# Patient Record
Sex: Female | Born: 1965
Health system: Southern US, Community
[De-identification: ages and names within clinical notes are randomized; demographics above are authoritative.]

## PROBLEM LIST (undated history)

## (undated) DIAGNOSIS — I10 Essential (primary) hypertension: Secondary | ICD-10-CM

## (undated) DIAGNOSIS — J189 Pneumonia, unspecified organism: Secondary | ICD-10-CM

## (undated) DIAGNOSIS — F419 Anxiety disorder, unspecified: Secondary | ICD-10-CM

## (undated) DIAGNOSIS — F32A Depression, unspecified: Secondary | ICD-10-CM

## (undated) DIAGNOSIS — F17200 Nicotine dependence, unspecified, uncomplicated: Secondary | ICD-10-CM

## (undated) DIAGNOSIS — M5020 Other cervical disc displacement, unspecified cervical region: Secondary | ICD-10-CM

## (undated) DIAGNOSIS — F329 Major depressive disorder, single episode, unspecified: Secondary | ICD-10-CM

## (undated) DIAGNOSIS — Z87442 Personal history of urinary calculi: Secondary | ICD-10-CM

## (undated) DIAGNOSIS — R911 Solitary pulmonary nodule: Secondary | ICD-10-CM

## (undated) HISTORY — DX: Essential (primary) hypertension: I10

## (undated) HISTORY — DX: Major depressive disorder, single episode, unspecified: F32.9

## (undated) HISTORY — DX: Pneumonia, unspecified organism: J18.9

## (undated) HISTORY — DX: Depression, unspecified: F32.A

## (undated) HISTORY — DX: Other cervical disc displacement, unspecified cervical region: M50.20

## (undated) HISTORY — DX: Nicotine dependence, unspecified, uncomplicated: F17.200

## (undated) HISTORY — PX: TONSILLECTOMY: SUR1361

## (undated) HISTORY — DX: Personal history of urinary calculi: Z87.442

## (undated) HISTORY — DX: Anxiety disorder, unspecified: F41.9

## (undated) HISTORY — DX: Solitary pulmonary nodule: R91.1

## (undated) HISTORY — PX: ABDOMINAL HERNIA REPAIR: SHX539

## (undated) HISTORY — PX: CYSTECTOMY: SUR359

---

## 1998-09-18 HISTORY — PX: CERVICAL FUSION: SHX112

## 2006-05-22 ENCOUNTER — Encounter: Admission: RE | Admit: 2006-05-22 | Discharge: 2006-08-01 | Payer: Self-pay | Admitting: Neurosurgery

## 2007-02-01 ENCOUNTER — Encounter (INDEPENDENT_AMBULATORY_CARE_PROVIDER_SITE_OTHER): Payer: Self-pay | Admitting: *Deleted

## 2007-02-01 ENCOUNTER — Ambulatory Visit (HOSPITAL_BASED_OUTPATIENT_CLINIC_OR_DEPARTMENT_OTHER): Admission: RE | Admit: 2007-02-01 | Discharge: 2007-02-01 | Payer: Self-pay | Admitting: *Deleted

## 2008-09-18 LAB — HM MAMMOGRAPHY: HM Mammogram: NORMAL

## 2010-08-18 DIAGNOSIS — J189 Pneumonia, unspecified organism: Secondary | ICD-10-CM

## 2010-08-18 DIAGNOSIS — R911 Solitary pulmonary nodule: Secondary | ICD-10-CM

## 2010-08-18 HISTORY — DX: Solitary pulmonary nodule: R91.1

## 2010-08-18 HISTORY — DX: Pneumonia, unspecified organism: J18.9

## 2010-08-29 ENCOUNTER — Emergency Department (HOSPITAL_BASED_OUTPATIENT_CLINIC_OR_DEPARTMENT_OTHER)
Admission: EM | Admit: 2010-08-29 | Discharge: 2010-08-29 | Payer: Self-pay | Source: Home / Self Care | Admitting: Emergency Medicine

## 2010-09-05 ENCOUNTER — Ambulatory Visit (HOSPITAL_BASED_OUTPATIENT_CLINIC_OR_DEPARTMENT_OTHER)
Admission: RE | Admit: 2010-09-05 | Discharge: 2010-09-05 | Payer: Self-pay | Source: Home / Self Care | Attending: Internal Medicine | Admitting: Internal Medicine

## 2010-09-05 ENCOUNTER — Ambulatory Visit: Payer: Self-pay | Admitting: Family

## 2010-09-05 ENCOUNTER — Encounter: Payer: Self-pay | Admitting: Family

## 2010-09-05 DIAGNOSIS — M542 Cervicalgia: Secondary | ICD-10-CM | POA: Insufficient documentation

## 2010-09-05 DIAGNOSIS — R911 Solitary pulmonary nodule: Secondary | ICD-10-CM | POA: Insufficient documentation

## 2010-09-05 DIAGNOSIS — I10 Essential (primary) hypertension: Secondary | ICD-10-CM | POA: Insufficient documentation

## 2010-09-05 DIAGNOSIS — F329 Major depressive disorder, single episode, unspecified: Secondary | ICD-10-CM | POA: Insufficient documentation

## 2010-09-05 DIAGNOSIS — J189 Pneumonia, unspecified organism: Secondary | ICD-10-CM | POA: Insufficient documentation

## 2010-09-05 DIAGNOSIS — F418 Other specified anxiety disorders: Secondary | ICD-10-CM | POA: Insufficient documentation

## 2010-09-05 LAB — CONVERTED CEMR LAB: Beta hcg, urine, semiquantitative: NEGATIVE

## 2010-09-20 ENCOUNTER — Encounter: Payer: Self-pay | Admitting: Family

## 2010-09-20 ENCOUNTER — Ambulatory Visit
Admission: RE | Admit: 2010-09-20 | Discharge: 2010-09-20 | Payer: Self-pay | Source: Home / Self Care | Attending: Family | Admitting: Family

## 2010-09-20 DIAGNOSIS — F411 Generalized anxiety disorder: Secondary | ICD-10-CM | POA: Insufficient documentation

## 2010-09-21 ENCOUNTER — Encounter: Payer: Self-pay | Admitting: Family

## 2010-10-04 ENCOUNTER — Encounter: Payer: Self-pay | Admitting: Family

## 2010-10-04 ENCOUNTER — Ambulatory Visit
Admission: RE | Admit: 2010-10-04 | Discharge: 2010-10-04 | Payer: Self-pay | Source: Home / Self Care | Attending: Family | Admitting: Family

## 2010-10-04 DIAGNOSIS — R059 Cough, unspecified: Secondary | ICD-10-CM | POA: Insufficient documentation

## 2010-10-04 DIAGNOSIS — R05 Cough: Secondary | ICD-10-CM | POA: Insufficient documentation

## 2010-10-06 ENCOUNTER — Encounter (INDEPENDENT_AMBULATORY_CARE_PROVIDER_SITE_OTHER): Payer: Self-pay | Admitting: *Deleted

## 2010-10-13 ENCOUNTER — Telehealth: Payer: Self-pay | Admitting: Critical Care Medicine

## 2010-10-13 ENCOUNTER — Ambulatory Visit
Admission: RE | Admit: 2010-10-13 | Discharge: 2010-10-13 | Payer: Self-pay | Source: Home / Self Care | Attending: Critical Care Medicine | Admitting: Critical Care Medicine

## 2010-10-13 ENCOUNTER — Ambulatory Visit (HOSPITAL_BASED_OUTPATIENT_CLINIC_OR_DEPARTMENT_OTHER)
Admission: RE | Admit: 2010-10-13 | Discharge: 2010-10-13 | Payer: Self-pay | Source: Home / Self Care | Attending: Critical Care Medicine | Admitting: Critical Care Medicine

## 2010-10-20 NOTE — Assessment & Plan Note (Signed)
   Allergies: 1)  ! Wellbutrin 2)  ! Darvocet   Complete Medication List: 1)  Naproxen 500 Mg Tabs (Naproxen) .... Take 1 tablet as needed for inflammation. 2)  Depo-provera 150 Mg/ml Susp (Medroxyprogesterone acetate) .Marland Kitchen.. 1 injection every 3 months. 3)  Nicotrol 10 Mg Inha (Nicotine) .... Use up to 6 cartridges a day inhaled in place of cigarettes.  use frequent puffing for 20 minutes each cartridge. 4)  Alprazolam 0.25 Mg Tabs (Alprazolam) .... 1/2 to 1 tablet by mouth up to 3 times daily as needed 5)  Citalopram Hydrobromide 20 Mg Tabs (Citalopram hydrobromide) .... Take 1 tablet once daily. 6)  Advair Diskus 100-50 Mcg/dose Aepb (Fluticasone-salmeterol) .... One puff twice daily  Patient Instructions: 1)  Call if you develop fever, if symptoms worsen, or if you are not feeling better in 48 to 72 hours. 2)  Follow up in February as scheduled.

## 2010-10-20 NOTE — Miscellaneous (Signed)
Summary: Controlled Substance Agreement  Controlled Substance Agreement   Imported By: Lanelle Bal 09/28/2010 14:26:23  _____________________________________________________________________  External Attachment:    Type:   Image     Comment:   External Document

## 2010-10-20 NOTE — Progress Notes (Signed)
Summary: CXR normal   Phone Note Outgoing Call   Reason for Call: Discuss lab or test results Summary of Call: call pt and tell her cxr completely normal, no pneumonia, take meds as RX from todays OV Initial call taken by: Storm Frisk MD,  October 13, 2010 4:07 PM  Follow-up for Phone Call        [facility] Crystal Jones RN  October 14, 2010 8:45 AM  Spoke with pt.  She was informed of above CXR results and recs per PW.  She verbalized understanding.   Follow-up by: Gweneth Dimitri RN,  October 14, 2010 9:26 AM

## 2010-10-20 NOTE — Assessment & Plan Note (Signed)
Summary: ANXIETY/MHF--rm 4   Vital Signs:  Patient profile:   45 year old female Menstrual status:  Currently on Depo injections Height:      64.5 inches Weight:      123 pounds BMI:     20.86 Temp:     97.8 degrees F oral Pulse rate:   84 / minute Pulse rhythm:   regular Resp:     16 per minute BP sitting:   120 / 90  (left arm) Cuff size:   regular  Vitals Entered By: Mervin Kung CMA Duncan Dull) (September 20, 2010 11:14 AM) CC: Pt here for follow up. States she has not had any improvement with her anxiety. Therapist recommended she try Lexapro. Is Patient Diabetic? No Pain Assessment Patient in pain? no      Comments Pt would like to try rx. Feels like her insides are shaking. Nicki Guadalajara Fergerson CMA Duncan Dull)  September 20, 2010 11:23 AM    Primary Care Provider:  Ms. Andria Meuse is a 45 year old female who presents today to establish care  CC:  Pt here for follow up. States she has not had any improvement with her anxiety. Therapist recommended she try Lexapro.Marland Kitchen  History of Present Illness: Anxiety- heart racing, can't breath, legs turn to jello.  [facility], therapist.  Has been seeing her since her divorce.  OB gave her xanax for sleep, has taken 1/2 xanax with some improvement.  Whole tab makes her sleepy.  Has tried multiple antidepressants in the past which she has not tolerated due to makes her feel light headed.  Has tried effexor in the past.    Depression- is unchanged.   Allergies: 1)  ! Wellbutrin 2)  ! Darvocet  Past History:  Past Medical History: Last updated: 09/05/2010 Hx of kidney stones Rupture C4,5; cervical fusion depression Pneumonia 12/11 Pulmonary nodule per CT 12/11  Past Surgical History: Last updated: 09/05/2010 Cervical fusion--2000 C-section x 2 Abdominal hernia repair as child  Review of Systems       see HPI  Physical Exam  General:  Well-developed,well-nourished,in no acute distress; alert,appropriate and cooperative  throughout examination Psych:  Pleasant, anxious appearing female.  Tearful at times during the interview.  Normally interactive and good eye contact.     Impression & Recommendations:  Problem # 1:  ANXIETY (ICD-300.00) Assessment Deteriorated She has tried multiple meds in past (see HPI of last office visit).  Has not tried citalopram.  A refill was provided for the alprazolam on an as needed basis.  Pt signed controlled substance agreement today.  She was also counseled on side effects and red flags that should prompt call to [place] as noted in sign out sheet.  25 minutes were spent with the patient today.  Greater than 50% of this time was spent counseling patient on depression and anxiety.   Her updated medication list for this problem includes:     Alprazolam 0.25 Mg Tabs (Alprazolam) .Marland Kitchen... 1/2 to 1 tablet by mouth up to 3 times daily as needed    Citalopram Hydrobromide 20 Mg Tabs (Citalopram hydrobromide) .Marland Kitchen... 1/2 tablet by mouth daily for 1 week, then increase to full tab once daily on second week as tolerated.  Orders: T-TSH [phone])  Problem # 2:  ELEVATED BLOOD PRESSURE WITHOUT DIAGNOSIS OF HYPERTENSION (ICD-796.2) Follow up BP check later in the visit was 160 over 100.  I suspect that anxiety is playing a role in her pressure.  I recommended that she f/u in  2 weeks for a f/u nurse visit.    Complete Medication List: 1)  Naproxen 500 Mg Tabs (Naproxen) .... Take 1 tablet as needed for inflammation. 2)  Depo-provera 150 Mg/ml Susp (Medroxyprogesterone acetate) .Marland Kitchen.. 1 injection every 3 months. 3)  Nicotrol 10 Mg Inha (Nicotine) .... Use up to 6 cartridges a day inhaled in place of cigarettes.  use frequent puffing for 20 minutes each cartridge. 4)  Alprazolam 0.25 Mg Tabs (Alprazolam) .... 1/2 to 1 tablet by mouth up to 3 times daily as needed 5)  Citalopram Hydrobromide 20 Mg Tabs (Citalopram hydrobromide) .... 1/2 tablet by mouth daily for 1 week, then increase to full tab once  daily on second week as tolerated.  Patient Instructions: 1)  Citalopram- Please start 1/2 tablet one a day for one week, then increase to a full tablet. 2)  It will likely take several weeks before you will notice improvement. 3)  Side effects of this medicine may include drowsiness or nausea.  If this becomes an issue for you call for further instructions. 4)  Very rarely people may develop suicidal thoughts when taking these types of medicines- should this happen to you, discontinue medication and go directly to the emergency room. 5)  Please arrange a follow up appointment in 1 month  Prescriptions: CITALOPRAM HYDROBROMIDE 20 MG TABS (CITALOPRAM HYDROBROMIDE) 1/2 tablet by mouth daily for 1 week, then increase to Full tab once daily on second week as tolerated.  #30 x 1   Entered and Authorized by:   Lemont Fillers FNP   Signed by:   Lemont Fillers FNP on 09/20/2010   Method used:   Electronically to        [facility] [phone] (retail)       [address]       [place], [place]  [address]       Ph: [phone]       Fax: [phone]   RxID:   [id number] ALPRAZOLAM 0.25 MG TABS (ALPRAZOLAM) 1/2 to 1 tablet by mouth up to 3 times daily as needed  #45 x 0   Entered and Authorized by:   Lemont Fillers FNP   Signed by:   Lemont Fillers FNP on 09/20/2010   Method used:   Print then Give to Patient   RxID:   [id number]    Orders Added: 1)  T-TSH [[id number]] 2)  Est. Patient Level IV [87564]    Current Allergies (reviewed today): ! [facility] ! DARVOCET

## 2010-10-20 NOTE — Assessment & Plan Note (Signed)
Summary: new to est bcbs/mhf--Rm 4   Vital Signs:  Patient profile:   45 year old female Menstrual status:  Currently on Depo injections LMP:     08/18/2008 Height:      64.5 inches Weight:      120.25 pounds BMI:     20.40 Temp:     97.8 degrees F oral Pulse rate:   78 / minute Pulse rhythm:   regular Resp:     16 per minute BP sitting:   140 / 92  (right arm) Cuff size:   regular  Vitals Entered By: Mervin Kung CMA Duncan Dull) (September 05, 2010 11:26 AM) Is Patient Diabetic? No Pain Assessment Patient in pain? yes     Location: neck Onset of pain  hx of cervical fusion LMP (date): 08/18/2008     Menstrual Status Currently on Depo injections Enter LMP: 08/18/2008 Last PAP Result normal, had biopsy that was normal   Primary Care Devinn Hurwitz:  Alexis English is a 45 year old female who presents today to establish care   History of Present Illness: Alexis English  is a 45 year old female who presents today to establish care.   1)Pneumonia- [facility] and was diagnosed with pneumonia.  She was treated with Avelox and has completed antibiotics.  She has continued to work during her treatment and has kept a busy schedule.   No fever had dizzy feeling/black outs, pale yestday.  Lasted about 30 minutes.    2) Neck pain- hx of cervical fusion.  Done in [facility], (dr. Rodney Booze) Uses naproxen as needed.  3) Remote hx of kidney stones 25 yrs ago.    4)Depression- has tried zoloft, paxil, pristique, wellbutrin- caused hives.  Other meds caused lightheadedness/dizziness.  Has been ok recently- also sees a therapist.    Dr. Christella Hartigan- [facility]- treating her with depo.  Preventive Screening-Counseling & Management  Alcohol-Tobacco     Alcohol drinks/day: <1     Smoking Status: current  Caffeine-Diet-Exercise     Does Patient Exercise: no  Problems Prior to Update: 1)  Pneumonia, Right Middle Lobe  (ICD-486) 2)  Pulmonary Nodule   (ICD-518.89)  Allergies (verified): 1)  ! Wellbutrin 2)  ! 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 Resp: + cough, clear, improved but not resolved.  CV:  Mild left sided "twinge" was on the right side last week GI:  mild nausea yesterday, mild diarrhea for several months GU: Denies dysuria Lymphatic: Denies lymphadenopathy Musculoskeletal:  Denies muscle/joint pain except (chronic neck pain) Skin:  Denies Rashes Psychiatric: + history of depression- sees therapist- depression is "situational"   Neuro: Denies numbness     Physical Exam  General:  Well-developed,well-nourished,in no acute distress; alert,appropriate and cooperative throughout examination Head:  Normocephalic and atraumatic without obvious abnormalities.  No apparent alopecia or balding. Eyes:  PERRLA, sclera clear Ears:  External ear exam shows no significant lesions or deformities.  Otoscopic examination reveals clear canals, tympanic membranes are intact bilaterally without bulging, retraction, inflammation or discharge. Hearing is grossly normal bilaterally. Mouth:  Oral mucosa and oropharynx without lesions or exudates.  Teeth in good repair. Neck:  No deformities, masses, or  tenderness noted. Lungs:  Normal respiratory effort, chest expands symmetrically. Lungs are clear to auscultation, no crackles or wheezes. Heart:  Normal rate and regular rhythm. S1 and S2 normal without gallop, murmur, click, rub or other extra sounds. Abdomen:  Bowel sounds positive,abdomen soft and non-tender without masses, organomegaly or hernias noted. Psych:  Cognition and judgment appear intact. Alert and cooperative with normal attention span and concentration. No apparent delusions, illusions, hallucinations   Impression & Recommendations:  Problem # 1:  PNEUMONIA, RIGHT MIDDLE LOBE (ICD-486) Assessment Improved ED records were reviewed.  Patient completed antibiotics.  F/u chest  x-ray notes resolution of her RML pneumonia.  She still doesn't have her full strength back.  I have encouraged her to rest and try to slow down while her body heals.  I have also encouraged her to quit smoking and a sample of Nicotrol inhalation cartridges was provided to the patient: [id number] nicotrol  Orders: CXR- 2view (CXR)  Problem # 2:  PULMONARY NODULE (ZOX-096.04) Assessment: New  This was noted on the CT which was performed in the ED.  Incidental finding.  Will plan a follow up study in 6 months to ensure stability.   Problem # 3:  NECK PAIN, CHRONIC (ICD-723.1) Assessment: Unchanged Continue as needed naproxen Her updated medication list for this problem includes:    Naproxen 500 Mg Tabs (Naproxen) .Marland Kitchen... Take 1 tablet as needed for inflammation.  Problem # 4:  DEPRESSION (ICD-311) Assessment: Unchanged Pt has been intolerant to antidepressants.  She feels that this problem is stable at this time.  Will monitor.   Problem # 5:  ELEVATED BLOOD PRESSURE WITHOUT DIAGNOSIS OF HYPERTENSION (ICD-796.2) Assessment: New Pt. was counseled on low sodium diet.  Plan to repeat in 1 month.   Complete Medication List: 1)  Naproxen 500 Mg Tabs (Naproxen) .... Take 1 tablet as needed for  inflammation. 2)  Depo-provera 150 Mg/ml Susp (Medroxyprogesterone acetate) .Marland Kitchen.. 1 injection every 3 months. 3)  Nicotrol 10 Mg Inha (Nicotine) .... Use up to 6 cartridges a day inhaled in place of cigarettes.  use frequent puffing for 20 minutes each cartridge.  Other Orders: Urine Pregnancy Test  (54098) EKG w/ Interpretation (93000)  Patient Instructions: 1)  Please complete your chest x-ray downstairs today. 2)  Follow up in 1 month for a blood pressure check. 3)  Work hard on quitting smoking.     Orders Added: 1)  CXR- 2view [CXR] 2)  Urine Pregnancy Test  [81025] 3)  EKG w/ Interpretation [93000] 4)  New Patient Level III [99203]    Prevention & Chronic Care Immunizations   Influenza vaccine: Not documented    Tetanus booster: Not documented    Pneumococcal vaccine: Not documented  Other Screening   Pap smear: normal, had biopsy that was normal  (02/16/2010)    Mammogram: normal  (09/18/2008)   Smoking status: current  (09/05/2010)  Lipids   Total Cholesterol: Not documented   LDL: Not documented   LDL Direct: Not documented   HDL: Not documented   Triglycerides: Not documented   Current Allergies (reviewed today): ! [facility] ! DARVOCET  Vital Signs:  Patient Profile:   45 year old female LMP:     08/18/2008 Height:     64.5 inches Weight:      120.25 pounds BMI:     20.40 Temp:     97.8 degrees F oral Pulse rate:   78 / minute Pulse rhythm:   regular Resp:     16 per minute BP sitting:   140 / 92 Cuff size:   regular    Location:   neck                   Preventive Care Screening  Pap Smear:    Date:  02/16/2010    Results:  normal, had biopsy that was normal   Mammogram:    Date:  09/18/2008    Results:  normal      Can't remember last tetanus.  Hasn't had flu shot this year. Nicki Guadalajara Fergerson CMA Duncan Dull)  September 05, 2010 11:44 AM    Laboratory Results   Urine Tests   Date/Time Reported: Mervin Kung CMA  Duncan Dull)  September 05, 2010 12:31 PM     Urine HCG: negative

## 2010-10-20 NOTE — Letter (Signed)
   [place] [facility] [address] [place], [place]  [address]  [place] Phone: [phone]      September 21, 2010   Alexis English [address] [place], [place] [address]  RE:  LAB RESULTS  Dear  Alexis English,  The following is an interpretation of your most recent lab tests.  Please take note of any instructions provided or changes to medications that have resulted from your lab work.  THYROID STUDIES:  Thyroid studies normal TSH: 1.662      Sincerely Yours,    Lemont Fillers FNP  Appended Document:  Mailed.

## 2010-10-20 NOTE — Letter (Signed)
Summary: Controlled Substances Contract  [place] [facility]  [address]   [place], [place] [address]   Phone: [phone]  Fax: [phone]    [facility] Controlled Substances Contract         Patient Name: Alexis English Patient DOB: 22-Jul-1966        Patient MRN:  [id number]        Physician's Name: _________________________________________   Patients must complete this contract before doctors at the [facility] office will be willing to prescribe controlled substances. I understand that: ___1)  I am responsible for my controlled substance medications.  If my prescription is lost, misplaced or stolen, or if I take more than prescribed, my doctor will not write me a new prescription. ___2)  I will not request or accept controlled substances or controlled substance prescriptions from any other doctor or clinic while I am receiving controlled substance treatment at [facility].  The ONLY exception is if controlled substances are prescribed for the treatment of an acute condition that is NOT the diagnosis for which I am receiving treatment at [facility].   I will call my physician at [facility] if I receive controlled substance or controlled substance prescriptions from anywhere else. ___3)  Controlled substance refills will be made ONLY during regular office hours. ___4)  Refills will not be made if I run out early.  Refills will not be made during work-in or urgent care visits.  Refills will NOT be made for "emergencies", such as on a Friday afternoon or by on call service at night or weekends.  I understand that I am required to call at least 2 business days prior to expiration date for controlled substance and/or needing controlled substance refills.   ___5)  I will not use illicit (illegal) drugs.  ___6)  I agree to take urine or blood drug tests when requested for routine screening. ___7)  I agree to use only ONE pharmacy  for filling ALL my controlled substance prescriptions.             Name and Location of Pharmacy:                                                                                                                  ___8)  I understand that my doctor may review my use of controlled substances using the [facility] Controlled Substance Reporting System. ___9)  If I behave in an abusive way towards [facility] Primary Care staff, my controlled substance prescriptions may be stopped, and I may be dismissed from this practice. ___10)  I understand that if I break any of the above terms of this contract, my pain prescription and/or treatment may be stopped immediately.  If I get controlled substances from someone else or use illegal drugs, I may be reported to all my doctors, medical facilities and appropriate authorities.  I have been fully informed by [facility] physicians and the staff regarding psychological dependence (  addiction) to controlled substances.  I understand that I should stop my medication ONLY under medical supervision or I may have withdrawal symptoms.  ***I have read this contract and it has been explained to me by [facility] physicians and/or their staff, and I fully understand the consequences of violating any of the terms of this contract.    Patient Signature _________________________________________ Date September 20, 2010   [facility] Staff Signature ____________________________________ Date September 20, 2010

## 2010-10-20 NOTE — Assessment & Plan Note (Signed)
Summary: Pulmonary Consultation   Copy to:  Sandford Craze FNP Primary Provider/Referring Provider:  Lemont Fillers FNP  CC:  Pulmonary Consult - lung nodule, recent PNA., and COPD initial evaluation.  History of Present Illness: Pulmonary Consultation  45 yo WF with c/o of PNA.  Pt notes had acute chest pain awoke at night.  12/11.  Pt had cough, was prod phlegm.  Pt also notes pulm nodule seen Pt notes since that time still dyspneic and having more pain on R side.   Pt notes cough is prod of phlegm Pt is smoking 1/2PPD Pt notes no prior dx of pna Pt now notes some fever. Pt notes now no abx. went to ED rx and  Pt had cxrthat  did clear,  then started on advair  per pcp Pt is not on  pred Pt notes no abx since 12/11 Pt is worse in am when first wakes up         This is a 45 year old woman who presents for COPD initial evaluation.  The patient complains of shortness of breath, chest tightness, chest pain worse with breathing and coughing, wheezing, cough, mucous production, nocturnal awakening, exercise induced symptoms, and congestion, but denies history of diagnosed COPD.  Prior evaluation and testing has included Cxr.  The dyspnea is described as with slow ADL's, with walking within room, with walking stairs, with walking from the car to building, and with walking to the mailbox and back.  Associated disease(s) include(s) indigestion, hoarseness, sore throat, nasal congestion, difficulty breathing through nose, anxiety, change in color of mucous, productive cough, and non-productive cough.  Oxygen evaluation is described as not on supplemental O2.  Prior effective treatment has included short acting beta agonists, long acting beta agonists, and inhaled corticosteroids.    Preventive Screening-Counseling & Management  Alcohol-Tobacco     Smoking Status: current     Smoking Cessation Counseling: yes     Packs/Day: 0.5     Pack years: 20  Current Medications  (verified): 1)  Naproxen 500 Mg Tabs (Naproxen) .... Take 1 Tablet As Needed For Inflammation. 2)  Depo-Provera 150 Mg/ml Susp (Medroxyprogesterone Acetate) .Marland Kitchen.. 1 Injection Every 3 Months. 3)  Nicotrol 10 Mg Inha (Nicotine) .... Use Up To 6 Cartridges A Day Inhaled in Place of Cigarettes.  Use Frequent Puffing For 20 Minutes Each Cartridge. 4)  Alprazolam 0.25 Mg Tabs (Alprazolam) .... 1/2 To 1 Tablet By Mouth Up To 3 Times Daily As Needed 5)  Citalopram Hydrobromide 20 Mg Tabs (Citalopram Hydrobromide) .... Take 1 Tablet Once Daily. 6)  Advair Diskus 100-50 Mcg/dose Aepb (Fluticasone-Salmeterol) .... One Puff Twice Daily  Allergies (verified): 1)  ! Wellbutrin 2)  ! Darvocet  Past History:  Past medical, surgical, family and social histories (including risk factors) reviewed, and no changes noted (except as noted below).  Past Medical History: Reviewed history from 09/05/2010 and no changes required. Hx of kidney stones Rupture C4,5; cervical fusion depression Pneumonia 12/11 Pulmonary nodule per CT 12/11  Past Surgical History: Cervical fusion--2000 C-section x 2 Abdominal hernia repair as child cyst removed on back x 2  Family History: Reviewed history from 09/05/2010 and no changes required. mother--living, MI (49) tobacco abuse, anxiety, arthritis father-- living, MI (age 82) 2 brothers-- alive and well 1 sister-- alive and well 2 daughters 2002 and 2006-- alive and well  Social History: Reviewed history from 09/05/2010 and no changes required. 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 1/2 ppd.  Started at age 15 Alcohol use-yes 4 weekly Regular exercise-no Packs/Day:  0.5 Pack years:  20  Review of Systems       The patient complains of non-productive cough, chest pain, nasal congestion/difficulty breathing through nose, anxiety, depression, and joint stiffness or pain.  The  patient denies shortness of breath with activity, shortness of breath at rest, coughing up blood, irregular heartbeats, acid heartburn, indigestion, loss of appetite, weight change, abdominal pain, difficulty swallowing, sore throat, tooth/dental problems, headaches, sneezing, itching, ear ache, hand/feet swelling, rash, change in color of mucus, and fever.        See HPI for Pulmonary, Cardiac, General, and ENT review of systems.  Vital Signs:  Patient profile:   45 year old female Menstrual status:  Currently on Depo injections Height:      63 inches Weight:      123 pounds BMI:     21.87 O2 Sat:      99 % on Room air Temp:     99.2 degrees F oral Pulse rate:   87 / minute BP sitting:   144 / 96  (left arm) Cuff size:   regular  Vitals Entered By: Gweneth Dimitri RN (October 13, 2010 2:33 PM)  O2 Flow:  Room air CC: Pulmonary Consult - lung nodule, recent PNA., COPD initial evaluation Comments Medications reviewed with patient Daytime contact number verified with patient. Gweneth Dimitri RN  October 13, 2010 2:34 PM    Physical Exam  Additional Exam:  Gen: anxious, angry, argumentatifve WF , in no distress, ENT: No lesions,  mouth clear,  oropharynx clear, no postnasal drip Neck: No JVD, no TMG, no carotid bruits Lungs: No use of accessory muscles, no dullness to percussion, distant BS Cardiovascular: RRR, heart sounds normal, no murmur or gallops, no peripheral edema Abdomen: soft and NT, no HSM,  BS normal Musculoskeletal: No deformities, no cyanosis or clubbing Neuro: alert, non focal Skin: Warm, no lesions or rashes    CXR  Procedure date:  10/13/2010  Findings:      IMPRESSION: Normal cardiopulmonary appearance with no worrisome focal or acute abnormality noted  Impression & Recommendations:  Problem # 1:  COUGH (ICD-786.2) Assessment Deteriorated ongoing prob asthmatic bronchitis with ongoing tobacco abuse, pt does not appear willing to help herself.  Very  argumentitive and demanding pt, poor disease insight,  doubt will be able to offer much here plan I suggested: Increase advair to 250 one puff two times a day Prednisone 10mg  4 each am x3days, 3 x 3days, 2 x 3days, 1 x 3days then stop Avelox one daily for 7days Use nicotrol to stop smoking Chest xray today>>>I will call with results return [place] [facility] in two weeks with spirometry on return uncertain if this pt will comply Orders: New Patient Level V (10272)  Medications Added to Medication List This Visit: 1)  Nicotrol 10 Mg Inha (Nicotine) .... Use 6 cartridges per day, 60-80 puff per cartridge 2)  Advair Diskus 250-50 Mcg/dose Misc (Fluticasone-salmeterol) .... One puff twice daily 3)  Avelox 400 Mg Tabs (Moxifloxacin hcl) .... By mouth daily 4)  Prednisone 10 Mg Tabs (Prednisone) .... 4 each am x3days, 3 x 3days, 2 x 3days, 1 x 3days then stop  Complete Medication List: 1)  Naproxen 500 Mg Tabs (Naproxen) .... Take 1 tablet as needed for inflammation. 2)  Depo-provera 150 Mg/ml Susp (Medroxyprogesterone acetate) .Marland Kitchen.. 1 injection every 3 months. 3)  Nicotrol  10 Mg Inha (Nicotine) .... Use 6 cartridges per day, 60-80 puff per cartridge 4)  Alprazolam 0.25 Mg Tabs (Alprazolam) .... 1/2 to 1 tablet by mouth up to 3 times daily as needed 5)  Citalopram Hydrobromide 20 Mg Tabs (Citalopram hydrobromide) .... Take 1 tablet once daily. 6)  Advair Diskus 250-50 Mcg/dose Misc (Fluticasone-salmeterol) .... One puff twice daily 7)  Avelox 400 Mg Tabs (Moxifloxacin hcl) .... By mouth daily 8)  Prednisone 10 Mg Tabs (Prednisone) .... 4 each am x3days, 3 x 3days, 2 x 3days, 1 x 3days then stop  Other Orders: T-2 View CXR ([id number])  Patient Instructions: 1)  Increase advair to 250 one puff two times a day 2)  Prednisone 10mg  4 each am x3days, 3 x 3days, 2 x 3days, 1 x 3days then stop 3)  Avelox one daily for 7days 4)  Use nicotrol to stop smoking 5)  Chest xray today>>>I will call with  results 6)  return [place] [facility] in two weeks with spirometry on return Prescriptions: ADVAIR DISKUS 250-50 MCG/DOSE  MISC (FLUTICASONE-SALMETEROL) One puff twice daily Brand medically necessary #1 x 6   Entered and Authorized by:   Storm Frisk MD   Signed by:   Storm Frisk MD on 10/13/2010   Method used:   [facility] to        [facility] [phone]* (retail)       [address]       [place], [place]  [address]       Ph: [phone]       Fax: [phone]   RxID:   [id number] PREDNISONE 10 MG  TABS (PREDNISONE) 4 each am x3days, 3 x 3days, 2 x 3days, 1 x 3days then stop  #30 x 0   Entered and Authorized by:   Storm Frisk MD   Signed by:   Storm Frisk MD on 10/13/2010   Method used:   [facility]ronically to        [facility] [phone]* (retail)       [address]       [place], [place]  [address]       Ph: [phone]       Fax: [phone]   RxID:   [id number] AVELOX 400 MG  TABS (MOXIFLOXACIN HCL) By mouth daily  #5 x 0   Entered and Authorized by:   Storm Frisk MD   Signed by:   Storm Frisk MD on 10/13/2010   Method used:   Electronically to        [facility] [phone]* (retail)       [address]       [place], [place]  [address]       Ph: [phone]       Fax: [phone]   RxID:   [id number] NICOTROL 10 MG INHA (NICOTINE) Use 6 cartridges per day, 60-80 puff per cartridge  #1 month x 2   Entered and Authorized by:   Storm Frisk MD   Signed by:   Storm Frisk MD on 10/13/2010   Method used:   Electronically to        [facility] [phone]* (retail)       [address]       [place], [place]  [address]       Ph: [phone]       Fax: [phone]   RxID:   [id number]

## 2010-10-20 NOTE — Assessment & Plan Note (Signed)
Summary: nurse visit bp check/mhf--rm 6  Nurse Visit   Vital Signs:  Patient profile:   45 year old female Menstrual status:  Currently on Depo injections Height:      64.5 inches O2 Sat:      100 % on Room air Temp:     98.0 degrees F oral Pulse rate:   84 / minute Pulse rhythm:   regular Resp:     16 per minute BP sitting:   140 / 96  (right arm) Cuff size:   regular  Vitals Entered By: Mervin Kung CMA Duncan Dull) (October 04, 2010 10:58 AM)  O2 Flow:  Room air  Physical Exam  General:  Well-developed,well-nourished,in no acute distress; alert,appropriate and cooperative throughout examination Lungs:  Normal respiratory effort, chest expands symmetrically. Lungs are clear to auscultation, no crackles or wheezes. Heart:  Normal rate and regular rhythm. S1 and S2 normal without gallop, murmur, click, rub or other extra sounds. Psych:  Cognition and judgment appear intact. Alert and cooperative with normal attention span and concentration. No apparent delusions, illusions, hallucinations   Impression & Recommendations:  Problem # 1:  COUGH (ICD-786.2) Assessment Unchanged  Suspect element of Reactive airway following her pneumonia.  She was again counseled on smoking cessation.  Trial of Advair.  Orders: Pulmonary Referral (Pulmonary)  Problem # 2:  PULMONARY NODULE (ICD-518.89) Assessment: Unchanged  Pt with incidental noted of pulmonary nodule on CT (7mm) RLL on 12/19.  She is very anxious about this finding.  Given ongoing cough and her concern about this pulmonary nodule, will plan referral to Dr. Vassie Loll (pulmonary) for formal evaluation.  Orders: Pulmonary Referral (Pulmonary)  Problem # 3:  ELEVATED BLOOD PRESSURE WITHOUT DIAGNOSIS OF HYPERTENSION (ICD-796.2) Assessment: Unchanged Plan follow up BP today: 140/96 Prior BP: 120/90 (09/20/2010)  10 Yr Risk Heart Disease: Not enough information  Instructed in low sodium diet (DASH Handout) and behavior  modification.    Complete Medication List: 1)  Naproxen 500 Mg Tabs (Naproxen) .... Take 1 tablet as needed for inflammation. 2)  Depo-provera 150 Mg/ml Susp (Medroxyprogesterone acetate) .Marland Kitchen.. 1 injection every 3 months. 3)  Nicotrol 10 Mg Inha (Nicotine) .... Use up to 6 cartridges a day inhaled in place of cigarettes.  use frequent puffing for 20 minutes each cartridge. 4)  Alprazolam 0.25 Mg Tabs (Alprazolam) .... 1/2 to 1 tablet by mouth up to 3 times daily as needed 5)  Citalopram Hydrobromide 20 Mg Tabs (Citalopram hydrobromide) .... Take 1 tablet once daily. 6)  Advair Diskus 100-50 Mcg/dose Aepb (Fluticasone-salmeterol) .... One puff twice daily  Hypertension Assessment/Plan:      The patient's hypertensive risk group is category B: At least one risk factor (excluding diabetes) with no target organ damage.  Today's blood pressure is 140/96.      Primary Care Provider:  Ms. Andria Meuse is a 45 year old female who presents today to establish care  CC:  Pt states she still has non-productive cough. Increased phlegm. Had night sweats last night. Concerned due to history of pneumonia. and Hypertension Management.  History of Present Illness: Ms.  Berrocal is a 45 year old female who presents today for follow up of her blood pressure and with chief complaint of cough.    1) Cough- + productive cough. "Never really went away after my pneumonia."  Denies fever.  Had night sweats for several months prior to her pneumonia.   Energy is good. Denies shortness of breath.    Hypertension History:  Positive major cardiovascular risk factors include current tobacco user.  Negative major cardiovascular risk factors include female age less than 53 years old.     CC: Pt states she still has non-productive cough. Increased phlegm. Had night sweats last night. Concerned due to history of pneumonia., Hypertension Management Is Patient Diabetic? No Pain Assessment Patient in pain? no       Comments Pt agrees all med doses and directions are correct. Nicki Guadalajara Fergerson CMA Duncan Dull)  October 04, 2010 11:10 AM    Allergies: 1)  ! Wellbutrin 2)  ! Darvocet  Orders Added: 1)  Pulmonary Referral [Pulmonary] 2)  Est. Patient Level III [64403] Prescriptions: ADVAIR DISKUS 100-50 MCG/DOSE AEPB (FLUTICASONE-SALMETEROL) one puff twice daily  #3 x 0   Entered by:   Lemont Fillers FNP   Authorized by:   Marland Kitchen LHPDEFAULTPROVIDER   Signed by:   Lemont Fillers FNP on 10/04/2010   Method used:   Samples Given   RxID:   [id number]   Current Allergies (reviewed today): ! [facility] ! DARVOCET

## 2010-10-20 NOTE — Letter (Signed)
Summary: Primary Care Consult Scheduled Letter  [place] [facility]  [address]   [place], [place] [address]   Phone: [phone]  Fax: [phone]      10/06/2010 MRN: [id number]  Alexis English [address] [place], [place]  [address]    Dear [name],      We have scheduled an appointment for you.  At the recommendation of MELISSA O'SULLIVAN,FNP , we have scheduled you a consult with DR Lanny Cramp [facility] [place] on FEBRUARY 14,2012 at 2:30PM.  Their address is_[address], [place] [address]. The office phone number is [phone].  If this appointment day and time is not convenient for you, please feel free to call the office of the doctor you are being referred to at the number listed above and reschedule the appointment.     It is important for you to keep your scheduled appointments. We are here to make sure you are given good patient care.    Thank you, Darral Dash Patient Care Coordinator [place] [facility]

## 2010-10-24 ENCOUNTER — Encounter: Payer: Self-pay | Admitting: Family

## 2010-10-24 ENCOUNTER — Ambulatory Visit (INDEPENDENT_AMBULATORY_CARE_PROVIDER_SITE_OTHER): Payer: BC Managed Care – PPO | Admitting: Family

## 2010-10-24 DIAGNOSIS — F411 Generalized anxiety disorder: Secondary | ICD-10-CM

## 2010-10-24 DIAGNOSIS — R030 Elevated blood-pressure reading, without diagnosis of hypertension: Secondary | ICD-10-CM

## 2010-11-01 ENCOUNTER — Institutional Professional Consult (permissible substitution): Payer: Self-pay | Admitting: Pulmonary Disease

## 2010-11-02 ENCOUNTER — Institutional Professional Consult (permissible substitution): Payer: Self-pay | Admitting: Pulmonary Disease

## 2010-11-03 ENCOUNTER — Ambulatory Visit: Payer: Self-pay | Admitting: Critical Care Medicine

## 2010-11-03 NOTE — Assessment & Plan Note (Signed)
Summary: 1 month fu/hea--rm 4   Vital Signs:  Patient profile:   45 year old female Menstrual status:  Currently on Depo injections Height:      63 inches Weight:      126.25 pounds BMI:     22.45 Temp:     98.1 degrees F oral Pulse rate:   78 / minute Pulse rhythm:   regular Resp:     16 per minute BP sitting:   128 / 88  (right arm) Cuff size:   regular  Vitals Entered By: Mervin Kung CMA Duncan Dull) (October 24, 2010 10:59 AM) CC: Pt here for 1 month follow up of depression and blood pressure check. Is Patient Diabetic? No Pain Assessment Patient in pain? no      Comments Pt needs refill on Citalopram. Mervin Kung CMA (AAMA)  October 24, 2010 11:04 AM    Primary Care Provider:  Lemont Fillers FNP  CC:  Pt here for 1 month follow up of depression and blood pressure check..  History of Present Illness: Ms.  Alexis English is a 45 year old female who presents today for follow up of her Anxiety.  Last visit, she was started on celexa 20 mg.  She reports that withhin 2-3 weeks of starting, her anxiety is much improved.  Rarely needing xanax.  No panic attacks.  Feels, "much better."  Tobacco abuse- trying to cut back. She notes that she has only smoked 5 cigarettes this week. Trying hard to quit.  Asthma/pulmonary nodule-  she saw Dr. Delford Field who treated her with prednisone and avelox.  Reports improvement in her breathing.  She has f/u scheduled with him for PFTs.  Allergies: 1)  ! Wellbutrin 2)  ! Darvocet  Family History: Reviewed history from 10/13/2010 and no changes required. mother--living, MI (49) tobacco abuse, anxiety, arthritis father-- living, MI (age 30) 2 brothers-- alive and well 1 sister-- alive and well 2 daughters 2002 and 2006-- alive and well  Social History: Reviewed history from 10/13/2010 and no changes required. 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 1/2 ppd.  Started at age 39 Alcohol use-yes 4 weekly Regular exercise-no  Physical Exam  General:  Well-developed,well-nourished,in no acute distress; alert,appropriate and cooperative throughout examination Head:  Normocephalic and atraumatic without obvious abnormalities. No apparent alopecia or balding. Lungs:  Normal respiratory effort, chest expands symmetrically. Lungs are clear to auscultation, no crackles or wheezes. Heart:  Normal rate and regular rhythm. S1 and S2 normal without gallop, murmur, click, rub or other extra sounds. Psych:  Cognition and judgment appear intact. Alert and cooperative with normal attention span and concentration. No apparent delusions, illusions, hallucinations   Impression & Recommendations:  Problem # 1:  ANXIETY (ICD-300.00) Assessment Improved Improved, will continue current dosing of citalopram.  15 minutes spent with patient.  Greater than 50% of this time was spent counseling patient on her anxiety. Her updated medication list for this problem includes:    Alprazolam 0.25 Mg Tabs (Alprazolam) .Marland Kitchen... 1/2 to 1 tablet by mouth up to 3 times daily as needed    Citalopram Hydrobromide 20 Mg Tabs (Citalopram hydrobromide) .Marland Kitchen... Take 1 tablet once daily.  Problem # 2:  ELEVATED BLOOD PRESSURE WITHOUT DIAGNOSIS OF HYPERTENSION (ICD-796.2) Assessment: Comment Only  Has been working on a low sodium diet. BP is improved, will continue to monitor.  BP today: 128/88 Prior BP: 144/96 (10/13/2010)  Prior 10 Yr Risk  Heart Disease: Not enough information (10/04/2010)  Instructed in low sodium diet (DASH Handout) and behavior modification.    Complete Medication List: 1)  Naproxen 500 Mg Tabs (Naproxen) .... Take 1 tablet as needed for inflammation. 2)  Depo-provera 150 Mg/ml Susp (Medroxyprogesterone acetate) .Marland Kitchen.. 1 injection every 3 months. 3)  Nicotrol 10 Mg Inha (Nicotine) .... Use 6 cartridges per day, 60-80 puff per cartridge 4)  Alprazolam  0.25 Mg Tabs (Alprazolam) .... 1/2 to 1 tablet by mouth up to 3 times daily as needed 5)  Citalopram Hydrobromide 20 Mg Tabs (Citalopram hydrobromide) .... Take 1 tablet once daily. 6)  Advair Diskus 250-50 Mcg/dose Misc (Fluticasone-salmeterol) .... One puff twice daily 7)  Prednisone 10 Mg Tabs (Prednisone) .... 4 each am x3days, 3 x 3days, 2 x 3days, 1 x 3days then stop  Patient Instructions: 1)  Please follow up in 3 months.  Prescriptions: CITALOPRAM HYDROBROMIDE 20 MG TABS (CITALOPRAM HYDROBROMIDE) Take 1 tablet once daily.  #30 x 3   Entered and Authorized by:   Lemont Fillers FNP   Signed by:   Lemont Fillers FNP on 10/24/2010   Method used:   [facility] to        [facility] [phone]* (retail)       [address]       [place], [place]  [address]       Ph: [phone]       Fax: [phone]   RxID:   [id number]    Orders Added: 1)  Est. Patient Level III [66440]    Current Allergies (reviewed today): ! [facility] ! DARVOCET

## 2010-11-29 LAB — DIFFERENTIAL
Basophils Relative: 0 % (ref 0–1)
Eosinophils Absolute: 0 10*3/uL (ref 0.0–0.7)
Eosinophils Relative: 0 % (ref 0–5)
Monocytes Absolute: 1.2 10*3/uL — ABNORMAL HIGH (ref 0.1–1.0)
Monocytes Relative: 8 % (ref 3–12)

## 2010-11-29 LAB — BASIC METABOLIC PANEL
BUN: 11 mg/dL (ref 6–23)
CO2: 22 mEq/L (ref 19–32)
Glucose, Bld: 118 mg/dL — ABNORMAL HIGH (ref 70–99)
Potassium: 3.9 mEq/L (ref 3.5–5.1)
Sodium: 139 mEq/L (ref 135–145)

## 2010-11-29 LAB — CBC
HCT: 40.2 % (ref 36.0–46.0)
Hemoglobin: 14.1 g/dL (ref 12.0–15.0)
MCH: 31.5 pg (ref 26.0–34.0)
MCHC: 35.1 g/dL (ref 30.0–36.0)
MCV: 89.9 fL (ref 78.0–100.0)

## 2010-11-29 LAB — POCT CARDIAC MARKERS: Myoglobin, poc: 41.8 ng/mL (ref 12–200)

## 2010-12-01 ENCOUNTER — Encounter: Payer: Self-pay | Admitting: Family

## 2010-12-14 ENCOUNTER — Ambulatory Visit: Payer: BC Managed Care – PPO | Admitting: Family

## 2011-01-23 ENCOUNTER — Ambulatory Visit: Payer: BC Managed Care – PPO | Admitting: Family

## 2011-01-31 NOTE — Consult Note (Signed)
NAME:  Alexis English, Alexis English NO.:  [id number]   MEDICAL RECORD NO.:  [id number]           PATIENT TYPE:   LOCATION:                                 FACILITY:   PHYSICIAN:  Jonelle Sports. Sevier, M.D. DATE OF BIRTH:  07-05-1966   DATE OF CONSULTATION:  03/15/2007  DATE OF DISCHARGE:                                 CONSULTATION   HISTORY:  This 45 year old white female is seen for evaluation of a  wound of her left suprascapular area which has not healed to her  satisfaction.   The patient apparently had some type of cyst, presumably dermoid or  sebaceous, in this area that was excised some 8 years ago.  At that time  it healed quickly and satisfactorily and there was no problem until an  apparent recurrence several months ago.  She consulted a general surgeon  here in town, whom she has not named for me, and underwent excision of  the cyst for a second time.   To make long story short, she has been happy neither with her results  since that time nor with the service she has obtained from that surgeon,  and is here for a second opinion and further advice.   In that the nurse found this wound to be healed on initial examination,  the patient is not given a full evaluation or history and will not be  charged for this visit.   On my examination, there is indeed a now healed and epithelialized wound  in the left suprascapular area which reflects a dehiscence from the  primary closure with secondary healing and also leaves her with a  depressed scar.  I can understand that this is considered by the patient  a cosmetically unacceptable result.   A long discussion is held with the patient.  This is not in any way, to  my way of thinking, a situation regarding any malpractice but just an  unfortunate result and a problem of unsatisfactory communication between  the patient and the primary surgeon.   More importantly, it is obvious that the only recourse available now  would  be an excision of this scar with a secondary closure.  This is  discussed with the patient and she would like to see the plastic surgeon  - Dr. Delia Chimes - for this purpose.  I have advised her that although  this is not my area of expertise, he may well wish to let the situation  heal and stabilize somewhat further before undertaking any such scar  revision.   With her permission, I will send him a copy of this note, and he will  see her in consultation at their mutual convenience to review the  situation with her.   Incidentally, the lesion here today measures 1.4-cm in length, 2-cm in  width, and 0.3-cm in depth.  It appears fully epithelialized and is not  draining and not infected.  Jon Billings, who incidentally comes here  referred by a friend who is in hospital administration in the [facility] and not by a physician.  ______________________________  Jonelle Sports Cheryll Cockayne, M.D.     RES/MEDQ  D:  03/15/2007  T:  03/15/2007  Job:  [id number]   cc:   Alfredia Ferguson, M.D.

## 2011-02-03 NOTE — Op Note (Signed)
NAME:  Alexis English, Alexis English NO.:  [id number]   MEDICAL RECORD NO.:  [id number]          PATIENT TYPE:  AMB   LOCATION:  [facility]                         FACILITY:  [facility]   PHYSICIAN:  Alfonse Ras, MD   DATE OF BIRTH:  1965/10/07   DATE OF PROCEDURE:  02/01/2007  DATE OF DISCHARGE:  02/01/2007                               OPERATIVE REPORT   PREOPERATIVE DIAGNOSIS:  Small epidermoid cyst of the left back.   PROCEDURE:  Excision of left back mass under local MAC anesthesia.   DESCRIPTION:  The patient was taken to the operating room and placed in  a supine position, was then rolled into the right lateral decubitus  position.  The back was prepped and draped normal sterile fashion.  The  skin and subcutaneous tissue was anesthetized.  I made an elliptical  incision around the epidermoid cyst which obviously had been chronically  ruptured.  The entire cyst sac and wall were excised with electrocautery  and sharp dissection and sent for pathologic evaluation.  The wound was  copiously irrigated and closed with subcuticular 3-0 Monocryl.  Steri-  Strips and dressings were applied.  The patient tolerated the procedure  well went to PACU in good condition.      Alfonse Ras, MD  Electronically Signed     KRE/MEDQ  D:  02/26/2007  T:  02/27/2007  Job:  [id number]

## 2011-04-18 ENCOUNTER — Other Ambulatory Visit: Payer: Self-pay | Admitting: Family

## 2011-04-18 NOTE — Telephone Encounter (Signed)
Left message for patient to return my call.

## 2011-04-18 NOTE — Telephone Encounter (Signed)
Patient made a follow up appt for 05-23-11

## 2011-04-18 NOTE — Telephone Encounter (Signed)
Pt last seen 10/24/10. Was due for f/u in May. Sent refill of citalopram # 30 x no refills to pharmacy. Pt must be seen for additional refills. Please arrange follow up with pt.

## 2011-05-23 ENCOUNTER — Other Ambulatory Visit: Payer: Self-pay | Admitting: Family

## 2011-05-23 ENCOUNTER — Encounter: Payer: Self-pay | Admitting: Family

## 2011-05-23 ENCOUNTER — Ambulatory Visit (INDEPENDENT_AMBULATORY_CARE_PROVIDER_SITE_OTHER): Payer: BC Managed Care – PPO | Admitting: Family

## 2011-05-23 VITALS — BP 122/86 | HR 90 | Temp 98.1°F | Resp 16 | Ht 64.5 in | Wt 140.0 lb

## 2011-05-23 DIAGNOSIS — R635 Abnormal weight gain: Secondary | ICD-10-CM

## 2011-05-23 DIAGNOSIS — F411 Generalized anxiety disorder: Secondary | ICD-10-CM

## 2011-05-23 LAB — TSH: TSH: 1.789 u[IU]/mL (ref 0.350–4.500)

## 2011-05-23 MED ORDER — DULOXETINE HCL 60 MG PO CPEP: 60.0000 mg | ORAL_CAPSULE | Freq: Every day | ORAL | Status: DC

## 2011-05-23 MED ORDER — VENLAFAXINE HCL 37.5 MG PO TABS: 37.5000 mg | ORAL_TABLET | Freq: Two times a day (BID) | ORAL | Status: DC

## 2011-05-23 NOTE — Patient Instructions (Signed)
Celexa 20mg , 1/2 once a day for 1 week, then every other day for 1 week then stop. You may start effexor immediately.  Complete lab work on the first floor.  Follow up in 1 month.

## 2011-05-23 NOTE — Progress Notes (Signed)
  Subjective:    Patient ID: Alexis English, female    DOB: 1966-05-26, 45 y.o.   MRN: [id number]  HPI  Alexis English is a 45 yr old female who presents today for follow up of her anxiety and depression. She reports that her anxiety and depression have been well controlled on Celexa, but she is very bothered by her recent weight gain.  Since last visit  Anxiety- improved.  Notes depression 20 pound weight gain in the last 6 months.  She reports exercising about 2 times a week.  Tobacco abuse- she reports that she continues to smoke.  She fears gaining weight if she tries to quit.       Review of Systems See HPI  Past Medical History  Diagnosis Date  . History of kidney stones   . Ruptured disc, cervical     C4-5; cervical fusion  . Depression   . Pneumonia 08/2010  . Pulmonary nodule 08/2010    per CT    History   Social History  . Marital Status: Divorced    Spouse Name: N/A    Number of Children: 2  . Years of Education: N/A   Occupational History  . OWNER     wedding Education officer, museum   Social History Main Topics  . Smoking status: Current Everyday Smoker -- 0.5 packs/day for 26 years  . Smokeless tobacco: Not on file   Comment: trying to stop smoking  . Alcohol Use: Yes     4 x weekly  . Drug Use: Not on file  . Sexually Active: Not on file   Other Topics Concern  . Not on file   Social History Narrative   Regular exercise:  No    Past Surgical History  Procedure Date  . Cervical fusion 2000  . Cesarean section     x2  . Abdominal hernia repair     as a child  . Cystectomy     from back x 2    Family History  Problem Relation Age of Onset  . Heart attack Mother   . Arthritis Mother   . Anxiety disorder Mother   . Heart attack Father     Allergies  Allergen Reactions  . Bupropion Hcl     REACTION: hives  . Propoxyphene N-Acetaminophen     REACTION: hives    Current Outpatient Prescriptions on File Prior to Visit  Medication Sig  Dispense Refill  . ALPRAZolam (XANAX) 0.25 MG tablet Take 1/2 to 1 tablet by mouth 3 times daily as needed.       . medroxyPROGESTERone (DEPO-PROVERA) 150 MG/ML injection Inject 150 mg into the muscle every 3 (three) months.        . nicotine (NICOTROL) 10 MG inhaler Use 6 cartridges per day, 60-80 puffs per cartridge.         BP 122/86  Pulse 90  Temp(Src) 98.1 F (36.7 C) (Oral)  Resp 16  Ht 5' 4.5" (1.638 m)  Wt 140 lb (63.504 kg)  BMI 23.66 kg/m2  LMP 09/19/2007       Objective:   Physical Exam  Constitutional: She appears well-developed and well-nourished.  Psychiatric: She has a normal mood and affect. Her behavior is normal. Judgment and thought content normal.          Assessment & Plan:

## 2011-05-24 ENCOUNTER — Encounter: Payer: Self-pay | Admitting: Family

## 2011-05-24 ENCOUNTER — Telehealth: Payer: Self-pay | Admitting: *Deleted

## 2011-05-24 NOTE — Assessment & Plan Note (Addendum)
Anxiety is stable, but Celexa seems to be the culprit in her weight gain.  TSH was repeated and is within normal limits.  We discussed transitioning her to Cymbalta but she tells me that this will be cost prohibitive for her. She tried Effexor a long time ago briefly and is willing to try this again.  Will taper off of citalopram while starting effexor.  25 minutes spent with the patient today.  >50% of this time was spent counseling pt on anxiety, depression, smoking cessation, diet, exercise and weight loss.

## 2011-05-24 NOTE — Telephone Encounter (Signed)
Pt returned my call and verified that she has enough of her current supply to wean off. No refill needed. Denial sent to pharmacy.

## 2011-05-24 NOTE — Telephone Encounter (Signed)
Called pharmacist to cancel Cymbalta and she verified that they never received Cymbalta rx yesterday. No rx to cancel.

## 2011-05-24 NOTE — Telephone Encounter (Signed)
Message copied by Kathi Simpers on Wed May 24, 2011  3:09 PM ------      Message from: O'SULLIVAN, MELISSA      Created: Tue May 23, 2011  2:07 PM       Could you please call pharmacy and cancel Cymbalta?thanks

## 2011-05-24 NOTE — Telephone Encounter (Signed)
Pt seen 05/23/11 and instructed to wean off Celexa. Left message for pt to return my call. Does she have enough supply to wean down or do we need to send refill to pharmacy?

## 2011-06-20 ENCOUNTER — Encounter: Payer: Self-pay | Admitting: Family

## 2011-06-20 ENCOUNTER — Ambulatory Visit (INDEPENDENT_AMBULATORY_CARE_PROVIDER_SITE_OTHER): Payer: BC Managed Care – PPO | Admitting: Family

## 2011-06-20 DIAGNOSIS — R030 Elevated blood-pressure reading, without diagnosis of hypertension: Secondary | ICD-10-CM

## 2011-06-20 DIAGNOSIS — F411 Generalized anxiety disorder: Secondary | ICD-10-CM

## 2011-06-20 MED ORDER — VENLAFAXINE HCL 37.5 MG PO TABS: 37.5000 mg | ORAL_TABLET | Freq: Two times a day (BID) | ORAL | Status: DC

## 2011-06-20 NOTE — Assessment & Plan Note (Signed)
This is stable. Plan to continue effexor at current dose.  She is instructed to use xanax sparingly.

## 2011-06-20 NOTE — Patient Instructions (Signed)
Please follow up in 1 month so that we can check your blood pressure.

## 2011-06-20 NOTE — Assessment & Plan Note (Signed)
Noted to have elevated BP today.  May be side effect of effexor. Pt was instructed on low sodium diet.  Plan follow up in 1 month for BP recheck.

## 2011-06-20 NOTE — Progress Notes (Signed)
  Subjective:    Patient ID: Alexis English, female    DOB: 11-17-1965, 45 y.o.   MRN: [id number]  HPI  Alexis English is a 45 yr old female who presents today for follow up of her anxiety and depression. Last visit her citalopram was changed to effexor due to side effect of weight gain.  She reports that she is having about 1 episode of anxiety a week.  These episodes are not as severe as she was initially having befoe treatment. She has not needed to use xanax recently.   Tobacco abuse- she reports that she is contemplating quitting.    Review of Systems See HPI  Past Medical History  Diagnosis Date  . History of kidney stones   . Ruptured disc, cervical     C4-5; cervical fusion  . Depression   . Pneumonia 08/2010  . Pulmonary nodule 08/2010    per CT    History   Social History  . Marital Status: Divorced    Spouse Name: N/A    Number of Children: 2  . Years of Education: N/A   Occupational History  . OWNER     wedding Education officer, museum   Social History Main Topics  . Smoking status: Current Everyday Smoker -- 0.5 packs/day for 26 years  . Smokeless tobacco: Not on file   Comment: trying to stop smoking  . Alcohol Use: Yes     4 x weekly  . Drug Use: Not on file  . Sexually Active: Not on file   Other Topics Concern  . Not on file   Social History Narrative   Regular exercise:  No    Past Surgical History  Procedure Date  . Cervical fusion 2000  . Cesarean section     x2  . Abdominal hernia repair     as a child  . Cystectomy     from back x 2    Family History  Problem Relation Age of Onset  . Heart attack Mother   . Arthritis Mother   . Anxiety disorder Mother   . Heart attack Father     Allergies  Allergen Reactions  . Bupropion Hcl     REACTION: hives  . Propoxyphene N-Acetaminophen     REACTION: hives    Current Outpatient Prescriptions on File Prior to Visit  Medication Sig Dispense Refill  . ALPRAZolam (XANAX) 0.25 MG  tablet Take 1/2 to 1 tablet by mouth 3 times daily as needed.       . medroxyPROGESTERone (DEPO-PROVERA) 150 MG/ML injection Inject 150 mg into the muscle every 3 (three) months.        . nicotine (NICOTROL) 10 MG inhaler Use 6 cartridges per day, 60-80 puffs per cartridge.         BP 140/95  Pulse 72  Temp(Src) 98.2 F (36.8 C) (Oral)  Resp 16  Ht 5' 4.5" (1.638 m)  Wt 138 lb 1.3 oz (62.633 kg)  BMI 23.34 kg/m2  LMP 09/19/2007       Objective:   Physical Exam  Constitutional: She appears well-developed and well-nourished.  Psychiatric: She has a normal mood and affect. Her behavior is normal. Judgment and thought content normal.          Assessment & Plan:

## 2011-07-21 ENCOUNTER — Encounter: Payer: Self-pay | Admitting: Family

## 2011-07-21 ENCOUNTER — Ambulatory Visit: Payer: BC Managed Care – PPO

## 2011-07-21 ENCOUNTER — Ambulatory Visit (INDEPENDENT_AMBULATORY_CARE_PROVIDER_SITE_OTHER): Payer: BC Managed Care – PPO | Admitting: Family

## 2011-07-21 DIAGNOSIS — N912 Amenorrhea, unspecified: Secondary | ICD-10-CM

## 2011-07-21 DIAGNOSIS — R635 Abnormal weight gain: Secondary | ICD-10-CM

## 2011-07-21 DIAGNOSIS — J984 Other disorders of lung: Secondary | ICD-10-CM

## 2011-07-21 DIAGNOSIS — R030 Elevated blood-pressure reading, without diagnosis of hypertension: Secondary | ICD-10-CM

## 2011-07-21 DIAGNOSIS — F411 Generalized anxiety disorder: Secondary | ICD-10-CM

## 2011-07-21 NOTE — Patient Instructions (Signed)
Please contact us if you decide you are willing to proceed with the follow Chest CT. Please follow up in 3-6 months. Sooner if problems or concerns.

## 2011-07-21 NOTE — Assessment & Plan Note (Signed)
SRI was discontinued, TSH is normal.  Pt is not exercising consistently or counting her calories.  I recommended that she cut her calories down to 1200 a day, try to exercise regularly.  Eat 3 meals a day.

## 2011-07-21 NOTE — Assessment & Plan Note (Addendum)
She has not had a 6 month follow up CT. As she is a smoker and a follow up is recommended- I discussed this with pt. She requests to think about it and let us know if she is agreeable to proceed.

## 2011-07-21 NOTE — Progress Notes (Signed)
Subjective:    Patient ID: Alexis English, female    DOB: 1965-11-01, 45 y.o.   MRN: [id number]  HPI  Pt presents today for follow up.  Elevated blood pressure- she reports that she tries to monitor her sodium.    Weight gain- Has gained 1 pound since last visit. She reports that she has been exercising intermittently. Doesn't eat breakfast or lunch, generally healthy dinner.  Irregular Menses-  Pt was on depo for 3 yrs.  Went off of the depo in August.  Notes consistent night sweats intermittently for 2 yrs.  Sister is menopausal and she is 41 month older than her.     Review of Systems  See HPI  Past Medical History  Diagnosis Date  . History of kidney stones   . Ruptured disc, cervical     C4-5; cervical fusion  . Depression   . Pneumonia 08/2010  . Pulmonary nodule 08/2010    per CT    History   Social History  . Marital Status: Divorced    Spouse Name: N/A    Number of Children: 2  . Years of Education: N/A   Occupational History  . OWNER     wedding Education officer, museum   Social History Main Topics  . Smoking status: Current Everyday Smoker -- 0.5 packs/day for 26 years  . Smokeless tobacco: Not on file   Comment: trying to stop smoking  . Alcohol Use: Yes     4 x weekly  . Drug Use: Not on file  . Sexually Active: Not on file   Other Topics Concern  . Not on file   Social History Narrative   Regular exercise:  No    Past Surgical History  Procedure Date  . Cervical fusion 2000  . Cesarean section     x2  . Abdominal hernia repair     as a child  . Cystectomy     from back x 2    Family History  Problem Relation Age of Onset  . Heart attack Mother   . Arthritis Mother   . Anxiety disorder Mother   . Heart attack Father     Allergies  Allergen Reactions  . Bupropion Hcl     REACTION: hives  . Propoxyphene N-Acetaminophen     REACTION: hives    Current Outpatient Prescriptions on File Prior to Visit  Medication Sig Dispense  Refill  . ALPRAZolam (XANAX) 0.25 MG tablet Take 1/2 to 1 tablet by mouth 3 times daily as needed.       . venlafaxine (EFFEXOR) 37.5 MG tablet Take 1 tablet (37.5 mg total) by mouth 2 (two) times daily.  60 tablet  2    BP 140/88  Pulse 66  Temp(Src) 98 F (36.7 C) (Oral)  Resp 16  Ht 5' 4.5" (1.638 m)  Wt 139 lb (63.05 kg)  BMI 23.49 kg/m2        Objective:   Physical Exam  Constitutional: She appears well-developed and well-nourished.  Cardiovascular: Normal rate and regular rhythm.   No murmur heard. Pulmonary/Chest: Effort normal and breath sounds normal. No respiratory distress. She has no wheezes. She has no rales. She exhibits no tenderness.  Psychiatric: She has a normal mood and affect. Her behavior is normal. Judgment and thought content normal.          Assessment & Plan:   BP Readings from Last 3 Encounters:  07/21/11 140/88  06/20/11 140/95  05/23/11 122/86

## 2011-07-21 NOTE — Assessment & Plan Note (Signed)
She is three months off of depo.  Not sexually active. She has apt with her GYN on 11/16.  I recommended that she address possibility of menopause and recent night sweats with GYN at her upcoming apt.

## 2011-07-21 NOTE — Assessment & Plan Note (Signed)
BP today is upper limit of normal.  Will continue to monitor on Effexor.

## 2011-07-21 NOTE — Assessment & Plan Note (Signed)
Stable on effexor. Continue same.  

## 2011-08-14 ENCOUNTER — Encounter: Payer: Self-pay | Admitting: Family

## 2011-08-14 ENCOUNTER — Telehealth: Payer: Self-pay | Admitting: Family

## 2011-08-14 ENCOUNTER — Ambulatory Visit (HOSPITAL_BASED_OUTPATIENT_CLINIC_OR_DEPARTMENT_OTHER)
Admission: RE | Admit: 2011-08-14 | Discharge: 2011-08-14 | Disposition: A | Discharge status: Home / Self Care | Payer: BC Managed Care – PPO | Source: Ambulatory Visit | Attending: Family | Admitting: Family

## 2011-08-14 ENCOUNTER — Ambulatory Visit (INDEPENDENT_AMBULATORY_CARE_PROVIDER_SITE_OTHER): Payer: BC Managed Care – PPO | Admitting: Family

## 2011-08-14 VITALS — BP 110/80 | HR 72 | Temp 97.8°F | Resp 16 | Wt 139.1 lb

## 2011-08-14 DIAGNOSIS — J111 Influenza due to unidentified influenza virus with other respiratory manifestations: Secondary | ICD-10-CM

## 2011-08-14 DIAGNOSIS — F17200 Nicotine dependence, unspecified, uncomplicated: Secondary | ICD-10-CM | POA: Insufficient documentation

## 2011-08-14 DIAGNOSIS — R509 Fever, unspecified: Secondary | ICD-10-CM

## 2011-08-14 DIAGNOSIS — R05 Cough: Secondary | ICD-10-CM

## 2011-08-14 DIAGNOSIS — J101 Influenza due to other identified influenza virus with other respiratory manifestations: Secondary | ICD-10-CM

## 2011-08-14 DIAGNOSIS — J984 Other disorders of lung: Secondary | ICD-10-CM | POA: Insufficient documentation

## 2011-08-14 DIAGNOSIS — R911 Solitary pulmonary nodule: Secondary | ICD-10-CM

## 2011-08-14 DIAGNOSIS — R059 Cough, unspecified: Secondary | ICD-10-CM

## 2011-08-14 LAB — POCT RAPID INFLUENZA A&B

## 2011-08-14 MED ORDER — BENZONATATE 100 MG PO CAPS: 100.0000 mg | ORAL_CAPSULE | Freq: Three times a day (TID) | ORAL | Status: AC | PRN

## 2011-08-14 MED ORDER — OSELTAMIVIR PHOSPHATE 75 MG PO CAPS: 75.0000 mg | ORAL_CAPSULE | Freq: Two times a day (BID) | ORAL | Status: AC

## 2011-08-14 NOTE — Patient Instructions (Signed)
Please complete your chest x-ray on the first floor.  We will contact you with results.

## 2011-08-14 NOTE — Assessment & Plan Note (Signed)
CXR is negative.  Rapid flu + for influenza A.  Plan to start tamiflu, supportive measures, call if symptoms worsen or if not feeling better by the weekend.  Pt verbalizes understanding.

## 2011-08-14 NOTE — Progress Notes (Signed)
  Subjective:    Patient ID: Alexis English, female    DOB: June 04, 1966, 45 y.o.   MRN: [id number]  HPI  Ms.  English is a 45 yr old female who presents today with multiple complaints.  Most notable is report of fever just under 102 this AM.  + Associated night sweats, head congestion, malaise, anorexia, non-productive cough and ear fullness.  Symptoms started Saturday.  She was recently given was given clonidine for hot flashes by her GYN.   + head congestion, ears feel full.   Review of Systems See HPI  Past Medical History  Diagnosis Date  . History of kidney stones   . Ruptured disc, cervical     C4-5; cervical fusion  . Depression   . Pneumonia 08/2010  . Pulmonary nodule 08/2010    per CT    History   Social History  . Marital Status: Divorced    Spouse Name: N/A    Number of Children: 2  . Years of Education: N/A   Occupational History  . OWNER     wedding Education officer, museum   Social History Main Topics  . Smoking status: Current Everyday Smoker -- 0.5 packs/day for 26 years  . Smokeless tobacco: Not on file   Comment: trying to stop smoking  . Alcohol Use: Yes     4 x weekly  . Drug Use: Not on file  . Sexually Active: Not on file   Other Topics Concern  . Not on file   Social History Narrative   Regular exercise:  No    Past Surgical History  Procedure Date  . Cervical fusion 2000  . Cesarean section     x2  . Abdominal hernia repair     as a child  . Cystectomy     from back x 2    Family History  Problem Relation Age of Onset  . Heart attack Mother   . Arthritis Mother   . Anxiety disorder Mother   . Heart attack Father     Allergies  Allergen Reactions  . Bupropion Hcl     REACTION: hives  . Propoxyphene N-Acetaminophen     REACTION: hives    Current Outpatient Prescriptions on File Prior to Visit  Medication Sig Dispense Refill  . ALPRAZolam (XANAX) 0.25 MG tablet Take 1/2 to 1 tablet by mouth 3 times daily as needed.        . venlafaxine (EFFEXOR) 37.5 MG tablet Take 1 tablet (37.5 mg total) by mouth 2 (two) times daily.  60 tablet  2    BP 110/80  Pulse 72  Temp(Src) 97.8 F (36.6 C) (Oral)  Resp 16  Wt 139 lb 1.3 oz (63.086 kg)       Objective:   Physical Exam  Constitutional: She appears well-developed and well-nourished. No distress.  HENT:  Head: Normocephalic and atraumatic.  Cardiovascular: Normal rate and regular rhythm.   No murmur heard. Pulmonary/Chest: Effort normal and breath sounds normal. No respiratory distress. She has no wheezes. She has no rales.       Diminished breath sounds throughout.  Skin: Skin is warm and dry.  Psychiatric: She has a normal mood and affect. Her behavior is normal. Judgment and thought content normal.          Assessment & Plan:

## 2011-08-14 NOTE — Telephone Encounter (Signed)
Spoke with pt. Reviewed + flu results and plan for tamiflu. She is to contact us if symptoms worsen or if she is not feeling better by the weekend.  Reminded her of need to complete a follow up CT Chest re: pulmonary nodule. She declines to have me schedule at this time but tells me that that she will call us later this week re: scheduling

## 2011-08-14 NOTE — Telephone Encounter (Signed)
Called rx to pharmacy

## 2011-08-22 ENCOUNTER — Telehealth: Payer: Self-pay | Admitting: Family

## 2011-08-22 MED ORDER — AMOXICILLIN-POT CLAVULANATE 875-125 MG PO TABS: 1.0000 | ORAL_TABLET | Freq: Two times a day (BID) | ORAL | Status: AC

## 2011-08-22 NOTE — Telephone Encounter (Signed)
Patient seen last week for flu, now has sinus infection/headache/dizzy,wants to know if something can be called in    ,Please call patiient

## 2011-08-22 NOTE — Telephone Encounter (Signed)
Patient informed. 

## 2011-08-22 NOTE — Telephone Encounter (Signed)
I have sent rx to her pharmacy for augmentin.  She will need to be seen in the office if she has fever >101, ifher symptoms worsen or if she is not feeling better in 2-3 days.

## 2011-08-22 NOTE — Telephone Encounter (Signed)
Please advise if something can be called in?

## 2011-08-29 ENCOUNTER — Telehealth: Payer: Self-pay | Admitting: Family

## 2011-08-29 MED ORDER — FLUCONAZOLE 150 MG PO TABS: ORAL_TABLET | ORAL | Status: DC

## 2011-08-29 NOTE — Telephone Encounter (Signed)
Pt.notified

## 2011-08-29 NOTE — Telephone Encounter (Signed)
Left message on voicemail to call me back with specific symptoms and can leave info on voicemail.

## 2011-08-29 NOTE — Telephone Encounter (Signed)
Patient returned phone call. Best # [phone]

## 2011-08-29 NOTE — Telephone Encounter (Signed)
Patient states that she now has a yeast infection from the antiobiotic that was given to her last week. She would like Melissa to call her in something to [facility] [address].

## 2011-08-29 NOTE — Telephone Encounter (Signed)
rx sent to pharmacy

## 2011-08-29 NOTE — Telephone Encounter (Signed)
Left message on machine to return my call. 

## 2011-08-29 NOTE — Telephone Encounter (Signed)
Pt reports vaginal itching, denies discharge. Has tried monistat and provided temporary relief but symptoms still persist. Pt would like # 2 of Diflucan as she has not completed abx yet.  Pt [facility] to [facility] [address]. Please advise.

## 2011-11-27 ENCOUNTER — Telehealth: Payer: Self-pay | Admitting: *Deleted

## 2011-11-27 DIAGNOSIS — R911 Solitary pulmonary nodule: Secondary | ICD-10-CM

## 2011-11-27 NOTE — Telephone Encounter (Signed)
Pt left message stating it is time for her to have a follow up CT chest to monitor her lung nodule. Please advise.

## 2011-11-28 NOTE — Telephone Encounter (Signed)
OK to proceed with CT 

## 2011-11-28 NOTE — Telephone Encounter (Signed)
Notified pt. She states she has not had intercourse in the past year. Pt declines to have urine HCG prior to CT. Please advise.

## 2011-11-28 NOTE — Telephone Encounter (Signed)
I have sent order.  Myriam Jacobson will call [employer] and arrange CT apt.  She will need to come up for urine hcg prior to CT please.

## 2011-11-29 ENCOUNTER — Ambulatory Visit (HOSPITAL_BASED_OUTPATIENT_CLINIC_OR_DEPARTMENT_OTHER)
Admission: RE | Admit: 2011-11-29 | Discharge: 2011-11-29 | Disposition: A | Discharge status: Home / Self Care | Payer: BC Managed Care – PPO | Source: Ambulatory Visit | Attending: Family | Admitting: Family

## 2011-11-29 ENCOUNTER — Other Ambulatory Visit (HOSPITAL_BASED_OUTPATIENT_CLINIC_OR_DEPARTMENT_OTHER): Payer: BC Managed Care – PPO

## 2011-11-29 ENCOUNTER — Telehealth: Payer: Self-pay | Admitting: Family

## 2011-11-29 DIAGNOSIS — R911 Solitary pulmonary nodule: Secondary | ICD-10-CM

## 2011-11-29 DIAGNOSIS — J984 Other disorders of lung: Secondary | ICD-10-CM | POA: Insufficient documentation

## 2011-11-29 DIAGNOSIS — R079 Chest pain, unspecified: Secondary | ICD-10-CM | POA: Insufficient documentation

## 2011-11-29 NOTE — Telephone Encounter (Signed)
Patient is requesting CT results.

## 2011-11-29 NOTE — Telephone Encounter (Signed)
Advised pt that we have not received final result yet and it may be tomorrow before we call her with the result. Pt voices understanding.

## 2011-12-01 NOTE — Telephone Encounter (Signed)
See result note.  

## 2012-03-26 ENCOUNTER — Ambulatory Visit (INDEPENDENT_AMBULATORY_CARE_PROVIDER_SITE_OTHER): Payer: BC Managed Care – PPO | Admitting: Family

## 2012-03-26 ENCOUNTER — Encounter: Payer: Self-pay | Admitting: Family

## 2012-03-26 VITALS — BP 92/58 | HR 88 | Temp 98.2°F | Resp 16 | Wt 134.0 lb

## 2012-03-26 DIAGNOSIS — M5412 Radiculopathy, cervical region: Secondary | ICD-10-CM

## 2012-03-26 DIAGNOSIS — N912 Amenorrhea, unspecified: Secondary | ICD-10-CM

## 2012-03-26 DIAGNOSIS — R635 Abnormal weight gain: Secondary | ICD-10-CM

## 2012-03-26 DIAGNOSIS — F411 Generalized anxiety disorder: Secondary | ICD-10-CM

## 2012-03-26 DIAGNOSIS — R030 Elevated blood-pressure reading, without diagnosis of hypertension: Secondary | ICD-10-CM

## 2012-03-26 MED ORDER — METHYLPREDNISOLONE 4 MG PO KIT: PACK | ORAL | Status: AC

## 2012-03-26 NOTE — Assessment & Plan Note (Signed)
BP looks great.  Monitor.

## 2012-03-26 NOTE — Patient Instructions (Addendum)
Call if symptoms worsen. Call us in 1 week to let us know how you are doing.

## 2012-03-26 NOTE — Assessment & Plan Note (Addendum)
Weigh has improved. Monitor.

## 2012-03-26 NOTE — Assessment & Plan Note (Signed)
Saw GYN, reports periods are regular.

## 2012-03-26 NOTE — Assessment & Plan Note (Signed)
No improvement with nsaids/flexeril.  Will rx with medrol dosepak.

## 2012-03-26 NOTE — Progress Notes (Signed)
Subjective:    Patient ID: Alexis English, female    DOB: 04-15-1966, 46 y.o.   MRN: [id number]  HPI  [name] is a 46 yr old female who presents today with chief complaint of neck pain.  Pt has hx of cervical disc fusion in 2000.  This was performed in [place].  Notes that symptoms started 1 month ago.  Shooting down the left arm. Worse with sitting down and laying down.  She has been using naproxen and flexeril from a "doc in the box." Not helping.  Symptoms are unchanged.    Anxiety-  Reports that she weaned herself off of her medication and is now feeling well.  Anxiety is "well controlled."  Amenorrhea- saw GYN.  Denies recent hot flashes.  Reports periods have been regular.  Weight gain-  Wait has trended downward which she is pleased about.                                           Review of Systems    see HPI  Past Medical History  Diagnosis Date  . History of kidney stones   . Ruptured disc, cervical     C4-5; cervical fusion  . Depression   . Pneumonia 08/2010  . Pulmonary nodule 08/2010    per CT    History   Social History  . Marital Status: Divorced    Spouse Name: N/A    Number of Children: 2  . Years of Education: N/A   Occupational History  . OWNER     wedding Education officer, museum   Social History Main Topics  . Smoking status: Current Everyday Smoker -- 0.5 packs/day for 26 years  . Smokeless tobacco: Not on file   Comment: trying to stop smoking  . Alcohol Use: Yes     4 x weekly  . Drug Use: Not on file  . Sexually Active: Not on file   Other Topics Concern  . Not on file   Social History Narrative   Regular exercise:  No    Past Surgical History  Procedure Date  . Cervical fusion 2000  . Cesarean section     x2  . Abdominal hernia repair     as a child  . Cystectomy     from back x 2    Family History  Problem Relation Age of Onset  . Heart attack Mother   . Arthritis Mother   . Anxiety disorder Mother   . Heart  attack Father     Allergies  Allergen Reactions  . Bupropion Hcl     REACTION: hives  . Propoxyphene-Acetaminophen     REACTION: hives    Current Outpatient Prescriptions on File Prior to Visit  Medication Sig Dispense Refill  . ALPRAZolam (XANAX) 0.25 MG tablet Take 1/2 to 1 tablet by mouth 3 times daily as needed.       . cloNIDine (CATAPRES) 0.1 MG tablet Take 0.1 mg by mouth at bedtime. For night sweats       . fluconazole (DIFLUCAN) 150 MG tablet Take one tablet by mouth today. May repeat in 3 days if recurrent symptoms  2 tablet  0    BP 92/58  Pulse 88  Temp 98.2 F (36.8 C) (Oral)  Resp 16  Wt 134 lb (60.782 kg)  SpO2 99%    Objective:   Physical  Exam  Constitutional: She appears well-developed and well-nourished. No distress.  Neurological: She has normal strength.       Bilateral UE stength/hand grasps 5/5.   Psychiatric: She has a normal mood and affect. Her speech is normal and behavior is normal. Thought content normal.          Assessment & Plan:

## 2012-03-26 NOTE — Assessment & Plan Note (Signed)
Improved, currently off of all meds.

## 2012-04-02 ENCOUNTER — Telehealth: Payer: Self-pay | Admitting: Family

## 2012-04-02 DIAGNOSIS — M5412 Radiculopathy, cervical region: Secondary | ICD-10-CM

## 2012-04-02 NOTE — Telephone Encounter (Signed)
Patient states that she is not feeing any better since last visit. She states that her left arm is still tingling,achy, and sometimes has shooting pain all the way to the wrist. She states that her and Efraim Kaufmann talked about a referral to Neurologist. She would like to proceed with referral and will go to whomever Melissa recommends.

## 2012-04-02 NOTE — Telephone Encounter (Signed)
I am happy to refer to Neurosurgery, but they generally require an MRI of the spine first.  I will place order for MRI if she is agreeable.

## 2012-04-02 NOTE — Telephone Encounter (Signed)
Notified pt and she is agreeable to proceed with MRI. Pt would like to have test performed at the [facility].

## 2012-04-09 ENCOUNTER — Telehealth: Payer: Self-pay | Admitting: Family

## 2012-04-09 ENCOUNTER — Ambulatory Visit (HOSPITAL_BASED_OUTPATIENT_CLINIC_OR_DEPARTMENT_OTHER)
Admission: RE | Admit: 2012-04-09 | Discharge: 2012-04-09 | Disposition: A | Discharge status: Home / Self Care | Payer: BC Managed Care – PPO | Source: Ambulatory Visit | Attending: Family | Admitting: Family

## 2012-04-09 ENCOUNTER — Encounter: Payer: Self-pay | Admitting: Family

## 2012-04-09 DIAGNOSIS — M4802 Spinal stenosis, cervical region: Secondary | ICD-10-CM | POA: Insufficient documentation

## 2012-04-09 DIAGNOSIS — M5090 Cervical disc disorder, unspecified, unspecified cervical region: Secondary | ICD-10-CM | POA: Insufficient documentation

## 2012-04-09 DIAGNOSIS — M79609 Pain in unspecified limb: Secondary | ICD-10-CM | POA: Insufficient documentation

## 2012-04-09 DIAGNOSIS — M5020 Other cervical disc displacement, unspecified cervical region: Secondary | ICD-10-CM | POA: Insufficient documentation

## 2012-04-09 DIAGNOSIS — M5412 Radiculopathy, cervical region: Secondary | ICD-10-CM

## 2012-04-09 DIAGNOSIS — R209 Unspecified disturbances of skin sensation: Secondary | ICD-10-CM | POA: Insufficient documentation

## 2012-04-09 DIAGNOSIS — M542 Cervicalgia: Secondary | ICD-10-CM | POA: Insufficient documentation

## 2012-04-09 MED ORDER — CYCLOBENZAPRINE HCL 10 MG PO TABS: 10.0000 mg | ORAL_TABLET | Freq: Every evening | ORAL | Status: DC | PRN

## 2012-04-09 MED ORDER — NAPROXEN 500 MG PO TABS: 500.0000 mg | ORAL_TABLET | Freq: Two times a day (BID) | ORAL | Status: DC

## 2012-04-09 NOTE — Telephone Encounter (Signed)
Spoke with pt.  Reviewed MRI results.  Plan referral to neurosurgeon- Dr. Wynetta Emery. Pt is agreeable to plan.

## 2012-04-09 NOTE — Telephone Encounter (Signed)
Left message for pt to return my call to discuss results.

## 2012-04-10 ENCOUNTER — Telehealth: Payer: Self-pay | Admitting: Family

## 2012-04-10 NOTE — Telephone Encounter (Signed)
Patient stated that she would like [place] to send cd/results of her MRI to her friend Dr. Cannon Kettle at [facility]. P: [phone].

## 2012-04-10 NOTE — Telephone Encounter (Signed)
Left message on home # to return my call. 

## 2012-04-11 NOTE — Telephone Encounter (Signed)
Spoke with pt, she has already picked up CD. Pt states she is undecided about proceeding with referral to neurosurgery. Pt is researching her options and may want to pursue steroid injections that was recommended by Dr Marca Ancona. Advised pt that it is her Provider's recommendation that she continue with referral to neurosurgery to discuss further treatment options as they can sometimes provide steroid injections as well. Pt states she will proceed with neurosurgery referral but would also like [place] to send referral to Dr Marca Ancona and she will call them to arrange an appt herself when she is ready.  Please advise.

## 2012-04-12 NOTE — Telephone Encounter (Signed)
Attempted to reach pt, left message that I had some additional questions for her. Notified her that I am out of the office today but will try her back on Monday to discuss.

## 2012-04-15 NOTE — Telephone Encounter (Signed)
Left message for pt to return my call.

## 2012-04-15 NOTE — Telephone Encounter (Signed)
Spoke with pt.  She would like to proceed with Neurosurgical referral.  I have asked care coordinator to set her up with the first available provider at [facility].

## 2012-04-17 ENCOUNTER — Telehealth: Payer: Self-pay | Admitting: Family

## 2012-04-17 NOTE — Telephone Encounter (Signed)
Message copied by Sandford Craze on Wed Apr 17, 2012  1:23 PM ------      Message from: Darral Dash E      Created: Mon Apr 15, 2012  3:40 PM          [place],  (Dr Wynetta Emery)  They have order her chart from their storage, will call pt to schedule as soon as they get old chart              ----- Message -----         From: Sandford Craze, NP         Sent: 04/15/2012   1:42 PM           To: Vilinda Flake,            Could you please check status of neurosurgical referral.  Pt needs to be seen ASAP.  OK to set her up with first available provider- PA is ok.  Please let me know.            Thanks,            [employer]

## 2012-05-08 ENCOUNTER — Telehealth: Payer: Self-pay | Admitting: Family

## 2012-05-08 DIAGNOSIS — M542 Cervicalgia: Secondary | ICD-10-CM

## 2012-05-08 NOTE — Telephone Encounter (Signed)
Patient is requesting a referral to Dr. Lamar Benes at [facility] for her neck/back pain. She would like to get a steroid injection. [phone]

## 2012-05-08 NOTE — Telephone Encounter (Signed)
Pls let pt know that I have placed referral. Alexis English will contact [facility] to initiate referral process. Pain management often takes several weeks before we hear back.

## 2012-05-08 NOTE — Telephone Encounter (Signed)
Called pt no answer LMOM md response.. 05/08/12@4 :55pm/LMB

## 2012-05-14 ENCOUNTER — Ambulatory Visit: Payer: BC Managed Care – PPO | Admitting: Rehabilitation

## 2012-10-04 ENCOUNTER — Telehealth: Payer: Self-pay | Admitting: *Deleted

## 2012-10-04 MED ORDER — CYCLOBENZAPRINE HCL 10 MG PO TABS
10.0000 mg | ORAL_TABLET | Freq: Every evening | ORAL | Status: DC | PRN
Start: 2012-10-04 — End: 2016-03-13

## 2012-10-04 MED ORDER — NAPROXEN 500 MG PO TABS: 500.0000 mg | ORAL_TABLET | Freq: Two times a day (BID) | ORAL | Status: DC

## 2012-10-04 NOTE — Telephone Encounter (Signed)
Pt reports pain in her neck. Has seen the neurosurgeon and states she cannot afford surgery at this time. Wants to know if we can prescribe an anti inflammatory and muscle relaxer to help relieve her symptoms. Reports that medrol dose pack did not help in the past. Please advise.

## 2012-10-04 NOTE — Telephone Encounter (Signed)
Left detailed message on cell and to call if any questions. 

## 2012-10-04 NOTE — Telephone Encounter (Signed)
Refill sent on naproxen and flexeril for short term use.

## 2012-11-02 ENCOUNTER — Other Ambulatory Visit: Payer: Self-pay

## 2012-11-28 IMAGING — CT CT CHEST W/ CM
2 of 3 series · 15 of 36 positions shown, 18 images · IV contrast (APPLIED)
Comparison: Chest radiograph 08/29/2010

CLINICAL DATA: Chest pain.  Abnormal chest x-ray.

CT CHEST WITH CONTRAST
TECHNIQUE: Multidetector CT imaging of the chest was performed
following the standard protocol during bolus administration of
intravenous contrast.
Contrast: 80 ml Fmnipaque-SZZ

[Series 2: chest 5.0 b31f · axial · 0.61mm/px · z∈[-335,-60]mm · 12 of 65 slices shown, 15 images]
[im 5/65  mediastinal]
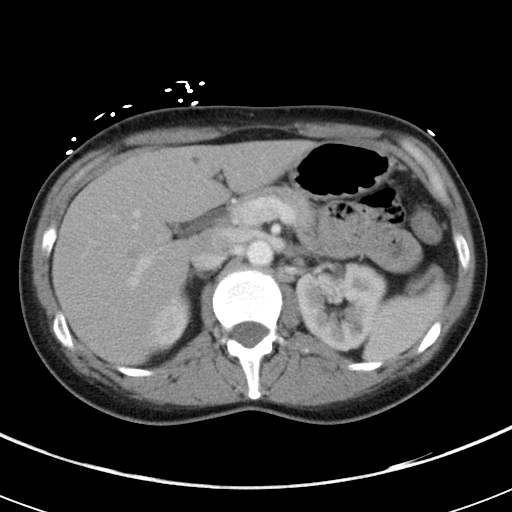
[im 5/65  lung]
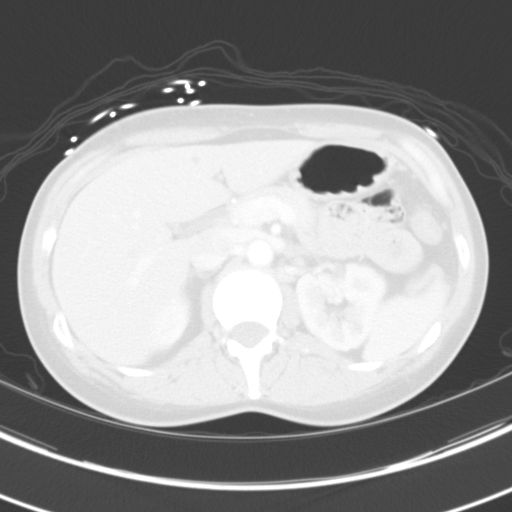
[im 10/65  lung]
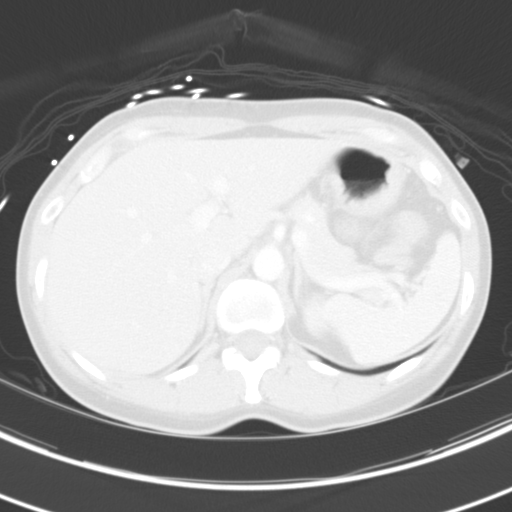
[im 15/65  lung]
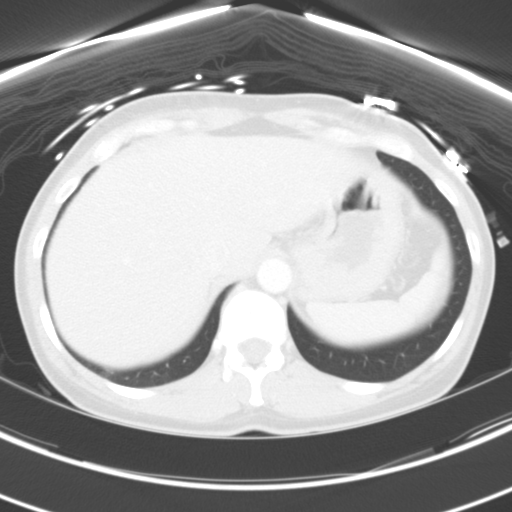
[im 19/65  lung]
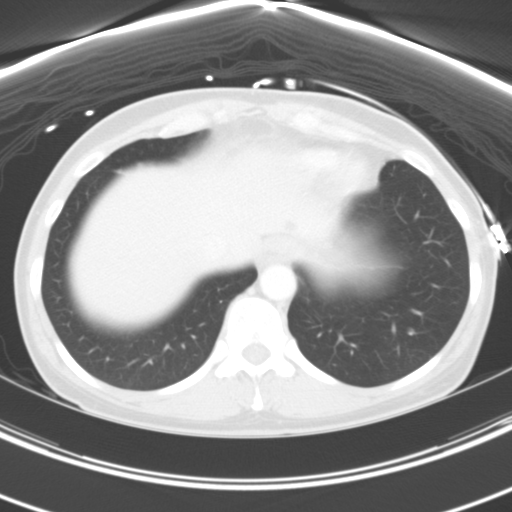
[im 24/65  mediastinal]
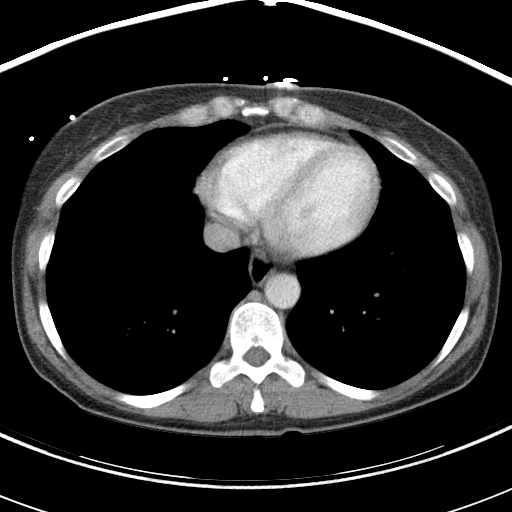
[im 24/65  lung]
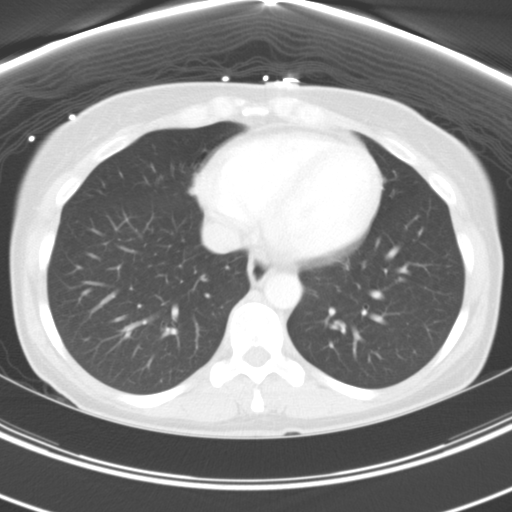
[im 29/65  lung]
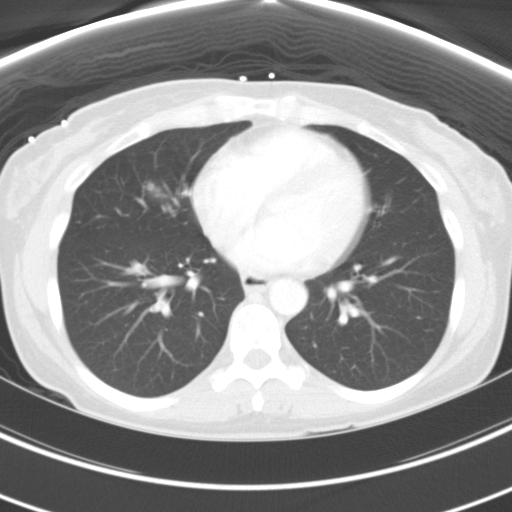
[im 36/65  lung]
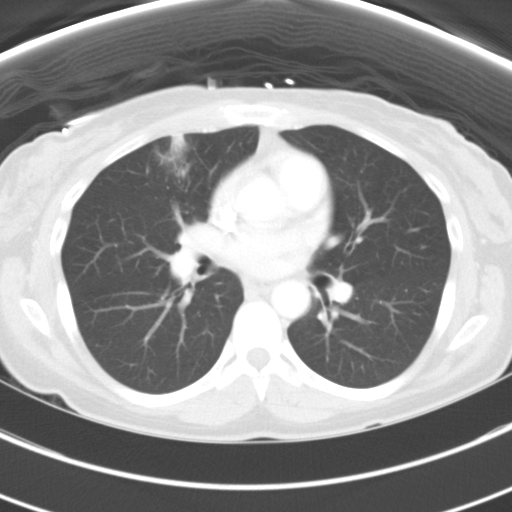
[im 41/65  lung]
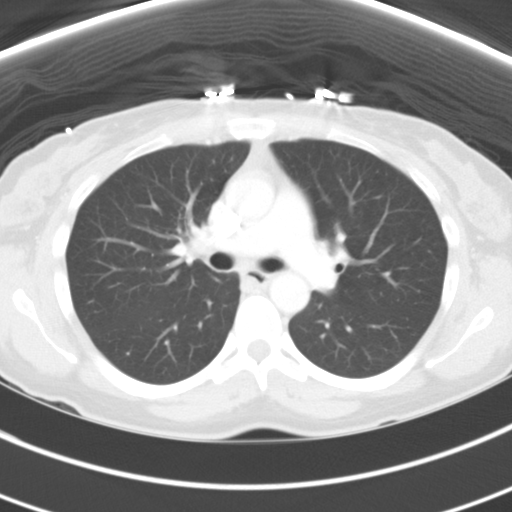
[im 46/65  mediastinal]
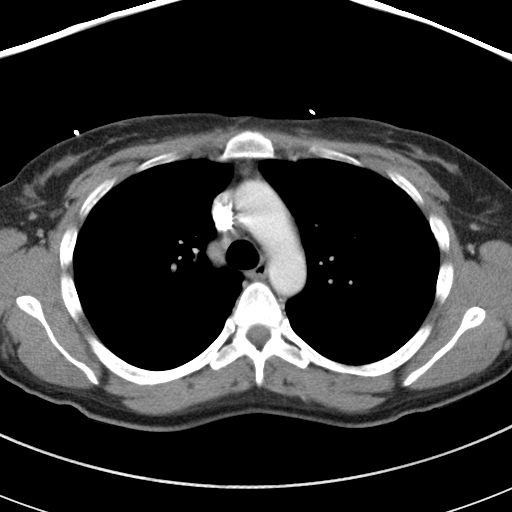
[im 46/65  lung]
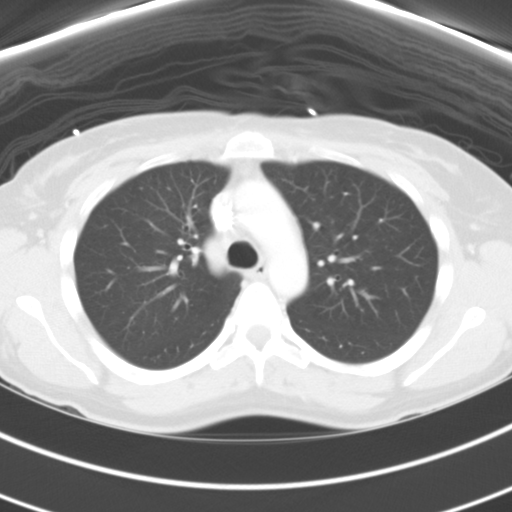
[im 50/65  lung]
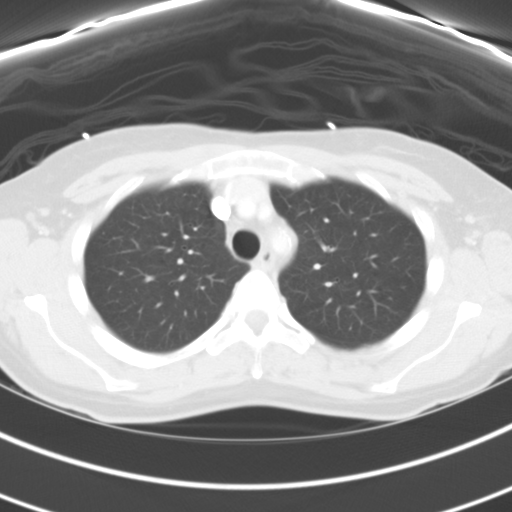
[im 55/65  lung]
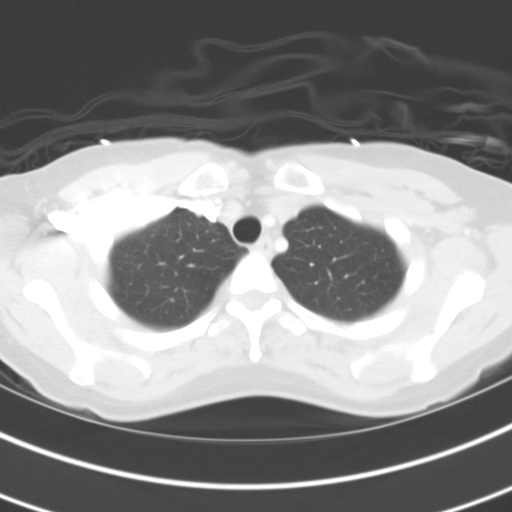
[im 60/65  lung]
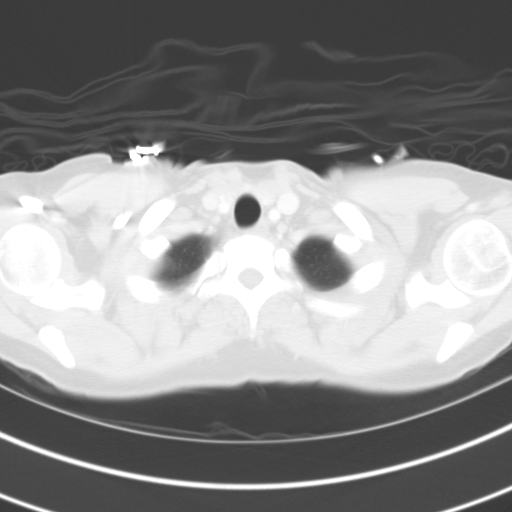

[Series 6: chest 3.0 coronal · coronal · 0.63mm/px · 3 of 98 slices shown]
[im 20/98  lung]
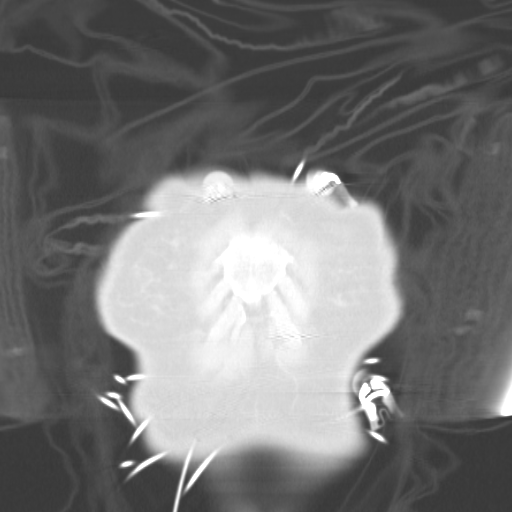
[im 39/98  lung]
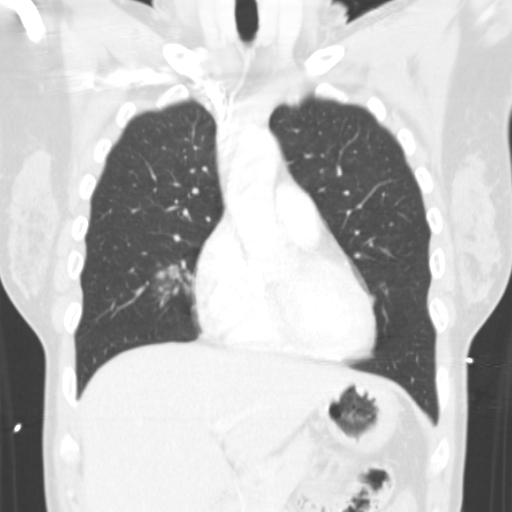
[im 59/98  lung]
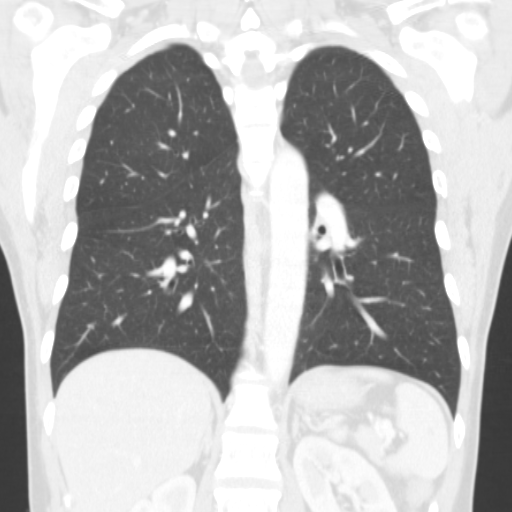

[15 of 36 positions shown; findings below may reference images not displayed]

FINDINGS: Residual thymus is seen in the anterior mediastinum.  No
pathologically enlarged mediastinal, hilar or axillary lymph nodes.
Heart size normal.  No pericardial effusion.

Focal airspace consolidation is seen in the anterior segment right
upper lobe, with surrounding ground-glass.  A tiny nodule or
nodular air space disease is seen in the adjacent right upper lobe
(image 33).  Mild peribronchovascular nodularity in the right
middle lobe.  A 7 mm nodule in the right lower lobe (image 32) has
a tag to the adjacent visceral pleura.  Lungs are otherwise clear.
No pleural fluid.  Airway is unremarkable.

Incidental imaging of the upper abdomen shows low attenuation
lesions in the liver measuring up to 1.4 cm.  No worrisome lytic or
sclerotic lesions.
IMPRESSION: 1.  Right upper lobe air space consolidation and surrounding ground-
glass have the appearance of pneumonia.  Pulmonary infarct and
malignancy cannot  be excluded.  Follow-up to clearing is
recommended.
2.  Right lower lobe nodule.  Follow-up in 6 months is recommended.
This recommendation follows the consensus statement: Guidelines for
Management of Small Pulmonary Nodules Detected on CT Scans:  A
Statement from the [HOSPITAL] as published in Radiology
8338; [DATE].  Available online at:
[URL]

## 2013-07-24 ENCOUNTER — Other Ambulatory Visit: Payer: Self-pay

## 2015-12-30 DIAGNOSIS — Z3042 Encounter for surveillance of injectable contraceptive: Secondary | ICD-10-CM | POA: Diagnosis not present

## 2016-01-28 DIAGNOSIS — F411 Generalized anxiety disorder: Secondary | ICD-10-CM | POA: Diagnosis not present

## 2016-01-31 ENCOUNTER — Ambulatory Visit (INDEPENDENT_AMBULATORY_CARE_PROVIDER_SITE_OTHER): Payer: BLUE CROSS/BLUE SHIELD | Admitting: Family

## 2016-01-31 ENCOUNTER — Ambulatory Visit: Payer: Self-pay | Admitting: Family

## 2016-01-31 ENCOUNTER — Encounter: Payer: Self-pay | Admitting: Family

## 2016-01-31 VITALS — BP 166/92 | HR 80 | Temp 98.5°F | Resp 16 | Ht 64.5 in | Wt 133.6 lb

## 2016-01-31 DIAGNOSIS — I10 Essential (primary) hypertension: Secondary | ICD-10-CM

## 2016-01-31 DIAGNOSIS — Z0000 Encounter for general adult medical examination without abnormal findings: Secondary | ICD-10-CM

## 2016-01-31 LAB — BASIC METABOLIC PANEL
BUN: 9 mg/dL (ref 6–23)
CALCIUM: 9.6 mg/dL (ref 8.4–10.5)
CO2: 26 meq/L (ref 19–32)
CREATININE: 0.75 mg/dL (ref 0.40–1.20)
Chloride: 106 mEq/L (ref 96–112)
GFR: 87.64 mL/min (ref >60.00)
GLUCOSE: 90 mg/dL (ref 70–99)
Potassium: 3.8 mEq/L (ref 3.5–5.1)
SODIUM: 139 meq/L (ref 135–145)

## 2016-01-31 MED ORDER — AMLODIPINE BESYLATE 5 MG PO TABS: 5.0000 mg | ORAL_TABLET | Freq: Every day | ORAL | Status: DC

## 2016-01-31 MED ORDER — MEDROXYPROGESTERONE ACETATE 150 MG/ML IM SUSP: 150.0000 mg | INTRAMUSCULAR | Status: DC

## 2016-01-31 MED ORDER — BUSPIRONE HCL 15 MG PO TABS: 15.0000 mg | ORAL_TABLET | Freq: Two times a day (BID) | ORAL | Status: DC

## 2016-01-31 NOTE — Patient Instructions (Signed)
Please complete lab work prior to leaving. Start amlodipine for blood pressure. Work on low sodium diet.  Follow up in 1 month for BP check. Schedule complete physical at the front desk.  Low-Sodium Eating Plan Sodium raises blood pressure and causes water to be held in the body. Getting less sodium from food will help lower your blood pressure, reduce any swelling, and protect your heart, liver, and kidneys. We get sodium by adding salt (sodium chloride) to food. Most of our sodium comes from canned, boxed, and frozen foods. Restaurant foods, fast foods, and pizza are also very high in sodium. Even if you take medicine to lower your blood pressure or to reduce fluid in your body, getting less sodium from your food is important. WHAT IS MY PLAN? Most people should limit their sodium intake to 2,300 mg a day. Your health care provider recommends that you limit your sodium intake to __________ a day.  WHAT DO I NEED TO KNOW ABOUT THIS EATING PLAN? For the low-sodium eating plan, you will follow these general guidelines:  Choose foods with a % Daily Value for sodium of less than 5% (as listed on the food label).   Use salt-free seasonings or herbs instead of table salt or sea salt.   Check with your health care provider or pharmacist before using salt substitutes.   Eat fresh foods.  Eat more vegetables and fruits.  Limit canned vegetables. If you do use them, rinse them well to decrease the sodium.   Limit cheese to 1 oz (28 g) per day.   Eat lower-sodium products, often labeled as "lower sodium" or "no salt added."  Avoid foods that contain monosodium glutamate (MSG). MSG is sometimes added to [place] food and some canned foods.  Check food labels (Nutrition Facts labels) on foods to learn how much sodium is in one serving.  Eat more home-cooked food and less restaurant, buffet, and fast food.  When eating at a restaurant, ask that your food be prepared with less salt, or  no salt if possible.  HOW DO I READ FOOD LABELS FOR SODIUM INFORMATION? The Nutrition Facts label lists the amount of sodium in one serving of the food. If you eat more than one serving, you must multiply the listed amount of sodium by the number of servings. Food labels may also identify foods as:  Sodium free--Less than 5 mg in a serving.  Very low sodium--35 mg or less in a serving.  Low sodium--140 mg or less in a serving.  Light in sodium--50% less sodium in a serving. For example, if a food that usually has 300 mg of sodium is changed to become light in sodium, it will have 150 mg of sodium.  Reduced sodium--25% less sodium in a serving. For example, if a food that usually has 400 mg of sodium is changed to reduced sodium, it will have 300 mg of sodium. WHAT FOODS CAN I EAT? Grains Low-sodium cereals, including oats, puffed wheat and rice, and shredded wheat cereals. Low-sodium crackers. Unsalted rice and pasta. Lower-sodium bread.  Vegetables Frozen or fresh vegetables. Low-sodium or reduced-sodium canned vegetables. Low-sodium or reduced-sodium tomato sauce and paste. Low-sodium or reduced-sodium tomato and vegetable juices.  Fruits Fresh, frozen, and canned fruit. Fruit juice.  Meat and Other Protein Products Low-sodium canned tuna and salmon. Fresh or frozen meat, poultry, seafood, and fish. Lamb. Unsalted nuts. Dried beans, peas, and lentils without added salt. Unsalted canned beans. Homemade soups without salt. Eggs.  Dairy Milk.  Soy milk. Ricotta cheese. Low-sodium or reduced-sodium cheeses. Yogurt.  Condiments Fresh and dried herbs and spices. Salt-free seasonings. Onion and garlic powders. Low-sodium varieties of mustard and ketchup. Fresh or refrigerated horseradish. Lemon juice.  Fats and Oils Reduced-sodium salad dressings. Unsalted butter.  Other Unsalted popcorn and pretzels.  The items listed above may not be a complete list of recommended foods or  beverages. Contact your dietitian for more options. WHAT FOODS ARE NOT RECOMMENDED? Grains Instant hot cereals. Bread stuffing, pancake, and biscuit mixes. Croutons. Seasoned rice or pasta mixes. Noodle soup cups. Boxed or frozen macaroni and cheese. Self-rising flour. Regular salted crackers. Vegetables Regular canned vegetables. Regular canned tomato sauce and paste. Regular tomato and vegetable juices. Frozen vegetables in sauces. Salted [place] fries. Olives. Angie Fava. Relishes. Sauerkraut. Salsa. Meat and Other Protein Products Salted, canned, smoked, spiced, or pickled meats, seafood, or fish. Bacon, ham, sausage, hot dogs, corned beef, chipped beef, and packaged luncheon meats. Salt pork. Jerky. Pickled herring. Anchovies, regular canned tuna, and sardines. Salted nuts. Dairy Processed cheese and cheese spreads. Cheese curds. Blue cheese and cottage cheese. Buttermilk.  Condiments Onion and garlic salt, seasoned salt, table salt, and sea salt. Canned and packaged gravies. Worcestershire sauce. Tartar sauce. Barbecue sauce. Teriyaki sauce. Soy sauce, including reduced sodium. Steak sauce. Fish sauce. Oyster sauce. Cocktail sauce. Horseradish that you find on the shelf. Regular ketchup and mustard. Meat flavorings and tenderizers. Bouillon cubes. Hot sauce. Tabasco sauce. Marinades. Taco seasonings. Relishes. Fats and Oils Regular salad dressings. Salted butter. Margarine. Ghee. Bacon fat.  Other Potato and tortilla chips. Corn chips and puffs. Salted popcorn and pretzels. Canned or dried soups. Pizza. Frozen entrees and pot pies.  The items listed above may not be a complete list of foods and beverages to avoid. Contact your dietitian for more information.   This information is not intended to replace advice given to you by your health care provider. Make sure you discuss any questions you have with your health care provider.   Document Released: 02/24/2002 Document Revised:  09/25/2014 Document Reviewed: 07/09/2013 Elsevier Interactive Patient Education [employer].

## 2016-01-31 NOTE — Progress Notes (Signed)
Subjective:    Patient ID: Alexis English, female    DOB: 02-Dec-1965, 50 y.o.   MRN: [id number]  HPI   Alexis English is a 50 yr old female who presents today to re-establish. Her last visit at our office was 04/09/12.   She wishes to discuss elevated blood pressure readings. Reports that she was at a psychiaty appointment for anxiety and was told that BP was high. Was placed on buspar.   BP Readings from Last 3 Encounters:  01/31/16 166/92  03/26/12 92/58  08/14/11 110/80    Review of Systems  Respiratory: Negative for shortness of breath.   Cardiovascular: Negative for chest pain and leg swelling.   Past Medical History  Diagnosis Date  . History of kidney stones   . Ruptured disc, cervical     C4-5; cervical fusion  . Depression   . Pneumonia 08/2010  . Pulmonary nodule 08/2010    per CT     Social History   Social History  . Marital Status: Divorced    Spouse Name: N/A  . Number of Children: 2  . Years of Education: N/A   Occupational History  . 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 Smoking status: Current Every Day Smoker -- 0.50 packs/day for 26 years  . Smokeless tobacco: Not on file     Comment: trying to stop smoking  . Alcohol Use: 0.0 oz/week    0 Standard drinks or equivalent per week     Comment: 4 x weekly  . Drug Use: Not on file  . Sexual Activity: Not on file   Other Topics Concern  . Not on file   Social History Narrative   Regular exercise:  No          Past Surgical History  Procedure Laterality Date  . Cervical fusion  2000  . Cesarean section      x2  . Abdominal hernia repair      as a child  . Cystectomy      from back x 2    Family History  Problem Relation Age of Onset  . Heart attack Mother   . Arthritis Mother   . Anxiety disorder Mother   . Heart attack Father     Allergies  Allergen Reactions  . Bupropion Hcl     REACTION: hives  . Propoxyphene N-Acetaminophen    REACTION: hives    Current Outpatient Prescriptions on File Prior to Visit  Medication Sig Dispense Refill  . cyclobenzaprine (FLEXERIL) 10 MG tablet Take 1 tablet (10 mg total) by mouth at bedtime as needed. 30 tablet 0  . naproxen (NAPROSYN) 500 MG tablet Take 1 tablet (500 mg total) by mouth 2 (two) times daily with a meal. 40 tablet 0   No current facility-administered medications on file prior to visit.    BP 166/92 mmHg  Pulse 80  Temp(Src) 98.5 F (36.9 C) (Oral)  Resp 16  Ht 5' 4.5" (1.638 m)  Wt 133 lb 9.6 oz (60.601 kg)  BMI 22.59 kg/m2  SpO2 100%       Objective:   Physical Exam  Constitutional: She appears well-developed and well-nourished.  Cardiovascular: Normal rate, regular rhythm and normal heart sounds.   No murmur heard. Pulmonary/Chest: Effort normal and breath sounds normal. No respiratory distress. She has no wheezes.  Psychiatric: She has a normal mood and affect. Her behavior is normal. Judgment and thought content  normal.          Assessment & Plan:  HTN- new. Start amlodipine, discussed low sodium diet. Obtain baseline bmet. Follow up in 1 month. She is due for mammogram as well and order has been placed.

## 2016-02-01 ENCOUNTER — Telehealth: Payer: Self-pay | Admitting: *Deleted

## 2016-02-01 NOTE — Telephone Encounter (Signed)
Order has been changed

## 2016-02-01 NOTE — Telephone Encounter (Signed)
-----   Message from Synthia Innocent sent at 02/01/2016  8:38 AM EDT ----- Are you requesting a screening mammogram? If so order will need to be changed. Please advise

## 2016-02-01 NOTE — Telephone Encounter (Signed)
Please advise 

## 2016-02-10 ENCOUNTER — Ambulatory Visit (HOSPITAL_BASED_OUTPATIENT_CLINIC_OR_DEPARTMENT_OTHER): Payer: BLUE CROSS/BLUE SHIELD

## 2016-02-10 ENCOUNTER — Telehealth: Payer: Self-pay | Admitting: Family

## 2016-02-10 MED ORDER — HYDROCHLOROTHIAZIDE 25 MG PO TABS: 25.0000 mg | ORAL_TABLET | Freq: Every day | ORAL | Status: DC

## 2016-02-10 NOTE — Telephone Encounter (Signed)
Please start hctz 25 mg one tab one daily #30, follow up in 2 weeks for OV

## 2016-02-10 NOTE — Telephone Encounter (Signed)
Please advise 

## 2016-02-10 NOTE — Telephone Encounter (Signed)
Spoke w/ Pt, informed her to continue same medications and start HCTZ 25 mg 1 tablet daily (Rx sent to [facility]). Scheduled Pt for 2 week F/U on 02/22/2016 at 1315.

## 2016-02-10 NOTE — Telephone Encounter (Signed)
Caller name:Self  Can be reached: [phone]   Reason for call: Patient called stating that she had her BP checked twice since being seen here and the readings were 134/93 on Monday and 140/98 today. Patient request call back to discuss if she needs more medication.

## 2016-02-11 ENCOUNTER — Telehealth: Payer: Self-pay | Admitting: Family

## 2016-02-11 ENCOUNTER — Ambulatory Visit (INDEPENDENT_AMBULATORY_CARE_PROVIDER_SITE_OTHER): Payer: BLUE CROSS/BLUE SHIELD | Admitting: Family

## 2016-02-11 VITALS — BP 131/97 | HR 92

## 2016-02-11 DIAGNOSIS — I10 Essential (primary) hypertension: Secondary | ICD-10-CM | POA: Diagnosis not present

## 2016-02-11 LAB — BASIC METABOLIC PANEL
BUN: 10 mg/dL (ref 7–25)
CALCIUM: 10 mg/dL (ref 8.6–10.2)
CO2: 23 mmol/L (ref 20–31)
CREATININE: 0.74 mg/dL (ref 0.50–1.10)
Chloride: 100 mmol/L (ref 98–110)
GLUCOSE: 69 mg/dL (ref 65–99)
Potassium: 3.8 mmol/L (ref 3.5–5.3)
Sodium: 139 mmol/L (ref 135–146)

## 2016-02-11 MED ORDER — AMLODIPINE BESYLATE 10 MG PO TABS: 10.0000 mg | ORAL_TABLET | Freq: Every day | ORAL | Status: DC

## 2016-02-11 NOTE — Progress Notes (Signed)
Pre visit review using our clinic review tool, if applicable. No additional management support is needed unless otherwise documented below in the visit note.  Patient presents in office for blood pressure check per Telephone note on 02/11/16. Reviewed medications with the patient. Today's readings were as follow: BP 139/93 P 93 (R Arm) BP 131/97 (L Arm) & BP 149/89 P 92 (R Arm).  Per Alexis English: Increase Amlodipine (Norvasc) to 10 mg daily and continue taking Hydrochlorthiazide 25 mg daily. Go to lab today for BMET. Follow-up in 2 weeks for blood pressure recheck. Informed patient of the provider's instructions. She voiced understanding and did not have any questions or concerns prior to leaving visit.  Patient will the call office back later to schedule next appointment.

## 2016-02-11 NOTE — Telephone Encounter (Signed)
Caller name:Self  Can be reached: [phone]   Reason for call: Patient states she is having a lot of anxiety and could not drive her child to school today. States her BP is elevated but has not taken it since yesterday. Not sure why she is having morning panic attacks. Would like a call back.

## 2016-02-11 NOTE — Progress Notes (Signed)
Noted and agree. 

## 2016-02-11 NOTE — Telephone Encounter (Signed)
Notified pt and she voices understanding. Scheduled nurse visit for today at 2pm for BP check.

## 2016-02-11 NOTE — Telephone Encounter (Signed)
Alexis English--should pt contact her psychiatrist?

## 2016-02-11 NOTE — Telephone Encounter (Signed)
I would recommend that she contact her psychiatrist to discuss anxiety since they are the once treating her anxiety.  We can schedule her for a nurse visit BP check today in the office while I am here.

## 2016-02-11 NOTE — Patient Instructions (Addendum)
Per Inda Castle: Increase Amlodipine (Norvasc) to 10 mg daily and continue taking Hydrochlorthiazide 25 mg daily. Go to lab today for BMET. Follow-up in 2 weeks for blood pressure recheck.

## 2016-02-15 ENCOUNTER — Encounter: Payer: Self-pay | Admitting: Family

## 2016-02-21 ENCOUNTER — Ambulatory Visit (HOSPITAL_BASED_OUTPATIENT_CLINIC_OR_DEPARTMENT_OTHER)
Admission: RE | Admit: 2016-02-21 | Discharge: 2016-02-21 | Disposition: A | Discharge status: Home / Self Care | Payer: BLUE CROSS/BLUE SHIELD | Source: Ambulatory Visit | Attending: Family | Admitting: Family

## 2016-02-21 DIAGNOSIS — Z1231 Encounter for screening mammogram for malignant neoplasm of breast: Secondary | ICD-10-CM | POA: Diagnosis not present

## 2016-02-21 DIAGNOSIS — Z0000 Encounter for general adult medical examination without abnormal findings: Secondary | ICD-10-CM

## 2016-02-22 ENCOUNTER — Encounter: Payer: Self-pay | Admitting: Family

## 2016-02-22 ENCOUNTER — Ambulatory Visit (INDEPENDENT_AMBULATORY_CARE_PROVIDER_SITE_OTHER): Payer: BLUE CROSS/BLUE SHIELD | Admitting: Family

## 2016-02-22 VITALS — BP 140/100 | HR 95 | Temp 98.3°F | Resp 16 | Ht 64.5 in | Wt 127.4 lb

## 2016-02-22 DIAGNOSIS — R634 Abnormal weight loss: Secondary | ICD-10-CM | POA: Diagnosis not present

## 2016-02-22 DIAGNOSIS — F411 Generalized anxiety disorder: Secondary | ICD-10-CM

## 2016-02-22 DIAGNOSIS — I10 Essential (primary) hypertension: Secondary | ICD-10-CM

## 2016-02-22 DIAGNOSIS — D72829 Elevated white blood cell count, unspecified: Secondary | ICD-10-CM

## 2016-02-22 DIAGNOSIS — Z0000 Encounter for general adult medical examination without abnormal findings: Secondary | ICD-10-CM

## 2016-02-22 DIAGNOSIS — R319 Hematuria, unspecified: Secondary | ICD-10-CM

## 2016-02-22 LAB — POC URINALSYSI DIPSTICK (AUTOMATED)
BILIRUBIN UA: NEGATIVE
Glucose, UA: NEGATIVE
LEUKOCYTES UA: NEGATIVE
Nitrite, UA: NEGATIVE
PH UA: 6
Protein, UA: NEGATIVE
SPEC GRAV UA: 1.015
Urobilinogen, UA: 0.2

## 2016-02-22 LAB — BASIC METABOLIC PANEL
BUN: 9 mg/dL (ref 6–23)
CALCIUM: 9.9 mg/dL (ref 8.4–10.5)
CO2: 27 mEq/L (ref 19–32)
CREATININE: 0.63 mg/dL (ref 0.40–1.20)
Chloride: 102 mEq/L (ref 96–112)
GFR: 107.15 mL/min (ref >60.00)
GLUCOSE: 82 mg/dL (ref 70–99)
POTASSIUM: 3.6 meq/L (ref 3.5–5.1)
Sodium: 139 mEq/L (ref 135–145)

## 2016-02-22 LAB — LIPID PANEL
CHOL/HDL RATIO: 3
CHOLESTEROL: 198 mg/dL (ref 0–200)
HDL: 61 mg/dL (ref >39.00)
LDL CALC: 125 mg/dL — AB (ref 0–99)
NonHDL: 137.4
Triglycerides: 63 mg/dL (ref 0.0–149.0)
VLDL: 12.6 mg/dL (ref 0.0–40.0)

## 2016-02-22 LAB — HEPATIC FUNCTION PANEL
ALK PHOS: 37 U/L — AB (ref 39–117)
ALT: 10 U/L (ref 0–35)
AST: 14 U/L (ref 0–37)
Albumin: 4.6 g/dL (ref 3.5–5.2)
BILIRUBIN DIRECT: 0.1 mg/dL (ref 0.0–0.3)
BILIRUBIN TOTAL: 0.4 mg/dL (ref 0.2–1.2)
Total Protein: 7.3 g/dL (ref 6.0–8.3)

## 2016-02-22 LAB — CBC WITH DIFFERENTIAL/PLATELET
BASOS ABS: 0 10*3/uL (ref 0.0–0.1)
Basophils Relative: 0.3 % (ref 0.0–3.0)
EOS ABS: 0.1 10*3/uL (ref 0.0–0.7)
Eosinophils Relative: 0.6 % (ref 0.0–5.0)
HCT: 42.8 % (ref 36.0–46.0)
HEMOGLOBIN: 14.5 g/dL (ref 12.0–15.0)
LYMPHS ABS: 2.7 10*3/uL (ref 0.7–4.0)
Lymphocytes Relative: 19.9 % (ref 12.0–46.0)
MCHC: 33.8 g/dL (ref 30.0–36.0)
MCV: 95.3 fl (ref 78.0–100.0)
MONO ABS: 0.9 10*3/uL (ref 0.1–1.0)
Monocytes Relative: 6.7 % (ref 3.0–12.0)
NEUTROS PCT: 72.5 % (ref 43.0–77.0)
Neutro Abs: 10 10*3/uL — ABNORMAL HIGH (ref 1.4–7.7)
Platelets: 356 10*3/uL (ref 150.0–400.0)
RBC: 4.48 Mil/uL (ref 3.87–5.11)
RDW: 12.9 % (ref 11.5–15.5)
WBC: 13.8 10*3/uL — AB (ref 4.0–10.5)

## 2016-02-22 LAB — TSH: TSH: 1.56 u[IU]/mL (ref 0.35–4.50)

## 2016-02-22 MED ORDER — VENLAFAXINE HCL 37.5 MG PO TABS: ORAL_TABLET | ORAL | Status: DC

## 2016-02-22 NOTE — Assessment & Plan Note (Signed)
Deteriorated. She does not wish to return to psychiatry. I told her that we can try her back on effexor, but if we are unable to control her anxiety with effexor then she will need to return to psychiatry.  She signed a controlled substance contract today due to benzo rx.

## 2016-02-22 NOTE — Assessment & Plan Note (Signed)
BP up today, but has not taken AM meds.

## 2016-02-22 NOTE — Progress Notes (Signed)
Pre visit review using our clinic review tool, if applicable. No additional management support is needed unless otherwise documented below in the visit note. 

## 2016-02-22 NOTE — Patient Instructions (Signed)
Complete blood work prior to leaving.  Start effexor. Continue blood pressure medications.  Call if BP < 100/60. Follow up as scheduled.

## 2016-02-22 NOTE — Progress Notes (Signed)
Subjective:    Patient ID: Alexis English, female    DOB: 1966/03/14, 50 y.o.   MRN: [id number]  HPI   [name] is a 50 yr old female who presents today for follow up.  1) HTN- She is currently maintained on HCTZ 25mg  and amlodipine 10mg .  Pt did not take her medication this AM.   BP Readings from Last 3 Encounters:  02/22/16 140/100  02/11/16 131/97  01/31/16 166/92   2) Anxiety- unable to tolerate buspar due to nausea/dizziness/weakness and muscle cramps. This was prescribed by psychiatry. Reports severe panic attacks, worst in the AM. Xanax helps but uses sparingly.   Reports + weight loss- has been working on [employer], exercise.  Wt Readings from Last 3 Encounters:  02/22/16 127 lb 6.4 oz (57.788 kg)  01/31/16 133 lb 9.6 oz (60.601 kg)  03/26/12 134 lb (60.782 kg)      Review of Systems See HPI  Past Medical History  Diagnosis Date  . History of kidney stones   . Ruptured disc, cervical     C4-5; cervical fusion  . Depression   . Pneumonia 08/2010  . Pulmonary nodule 08/2010    per CT     Social History   Social History  . Marital Status: Divorced    Spouse Name: N/A  . Number of Children: 2  . Years of Education: N/A   Occupational History  . 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 Smoking status: Current Every Day Smoker -- 0.50 packs/day for 26 years  . Smokeless tobacco: Not on file     Comment: trying to stop smoking  . Alcohol Use: 0.0 oz/week    0 Standard drinks or equivalent per week     Comment: 4 x weekly  . Drug Use: Not on file  . Sexual Activity: Not on file   Other Topics Concern  . Not on file   Social History Narrative   Regular exercise:  No          Past Surgical History  Procedure Laterality Date  . Cervical fusion  2000  . Cesarean section      x2  . Abdominal hernia repair      as a child  . Cystectomy      from back x 2    Family History  Problem Relation Age of Onset   . Heart attack Mother   . Arthritis Mother   . Anxiety disorder Mother   . Heart attack Father     Allergies  Allergen Reactions  . Bupropion Hcl     REACTION: hives  . Buspar [Buspirone] Other (See Comments)    Nausea, dizziness, weakness, muscle cramps  . Propoxyphene N-Acetaminophen     REACTION: hives    Current Outpatient Prescriptions on File Prior to Visit  Medication Sig Dispense Refill  . ALPRAZolam (XANAX) 0.5 MG tablet Take 0.5 mg by mouth as needed.  0  . amLODipine (NORVASC) 10 MG tablet Take 1 tablet (10 mg total) by mouth daily. 30 tablet 1  . cyclobenzaprine (FLEXERIL) 10 MG tablet Take 1 tablet (10 mg total) by mouth at bedtime as needed. 30 tablet 0  . hydrochlorothiazide (HYDRODIURIL) 25 MG tablet Take 1 tablet (25 mg total) by mouth daily. 30 tablet 0  . medroxyPROGESTERone (DEPO-PROVERA) 150 MG/ML injection Inject 1 mL (150 mg total) into the muscle every 3 (three) months. 1 mL   .  naproxen (NAPROSYN) 500 MG tablet Take 1 tablet (500 mg total) by mouth 2 (two) times daily with a meal. 40 tablet 0   No current facility-administered medications on file prior to visit.    BP 140/100 mmHg  Pulse 95  Temp(Src) 98.3 F (36.8 C) (Oral)  Resp 16  Ht 5' 4.5" (1.638 m)  Wt 127 lb 6.4 oz (57.788 kg)  BMI 21.54 kg/m2  SpO2 100%       Objective:   Physical Exam  Constitutional: She appears well-developed and well-nourished.  Cardiovascular: Normal rate, regular rhythm and normal heart sounds.   No murmur heard. Pulmonary/Chest: Effort normal and breath sounds normal. No respiratory distress. She has no wheezes.  Psychiatric: Her behavior is normal. Judgment and thought content normal.  Anxious appearing          Assessment & Plan:  Weight loss- likely secondary to change in diet and activity. Advised pt that her current weight is good and to avoid further weight loss. She requests to have cpx labs drawn today so will also check TSH.

## 2016-02-24 ENCOUNTER — Encounter: Payer: Self-pay | Admitting: Family

## 2016-02-24 NOTE — Telephone Encounter (Signed)
Alexis English the pt wants to know if you would be able to order a thyroid panel test. Please advise and I can put in the order.

## 2016-02-24 NOTE — Addendum Note (Signed)
Addended by: Tasia Catchings on: 02/24/2016 11:46 AM   Modules accepted: Orders

## 2016-02-24 NOTE — Progress Notes (Signed)
Quick Note:  Pt has seen results on MyChart and message also sent for patient to call back if any questions. ______ 

## 2016-03-06 ENCOUNTER — Other Ambulatory Visit: Payer: Self-pay | Admitting: Family

## 2016-03-06 MED ORDER — HYDROCHLOROTHIAZIDE 25 MG PO TABS: 25.0000 mg | ORAL_TABLET | Freq: Every day | ORAL | Status: DC

## 2016-03-06 NOTE — Addendum Note (Signed)
Addended by: Kelle Darting A on: 03/06/2016 03:32 PM   Modules accepted: Orders

## 2016-03-13 ENCOUNTER — Encounter: Payer: Self-pay | Admitting: Family

## 2016-03-13 ENCOUNTER — Ambulatory Visit (INDEPENDENT_AMBULATORY_CARE_PROVIDER_SITE_OTHER): Payer: BLUE CROSS/BLUE SHIELD | Admitting: Family

## 2016-03-13 VITALS — BP 126/86 | HR 89 | Temp 98.1°F | Resp 18 | Ht 64.5 in | Wt 124.8 lb

## 2016-03-13 DIAGNOSIS — F411 Generalized anxiety disorder: Secondary | ICD-10-CM

## 2016-03-13 DIAGNOSIS — Z23 Encounter for immunization: Secondary | ICD-10-CM

## 2016-03-13 DIAGNOSIS — L989 Disorder of the skin and subcutaneous tissue, unspecified: Secondary | ICD-10-CM | POA: Diagnosis not present

## 2016-03-13 DIAGNOSIS — I10 Essential (primary) hypertension: Secondary | ICD-10-CM | POA: Diagnosis not present

## 2016-03-13 DIAGNOSIS — Z0001 Encounter for general adult medical examination with abnormal findings: Secondary | ICD-10-CM

## 2016-03-13 DIAGNOSIS — Z0000 Encounter for general adult medical examination without abnormal findings: Secondary | ICD-10-CM

## 2016-03-13 LAB — URINALYSIS, ROUTINE W REFLEX MICROSCOPIC
Bilirubin Urine: NEGATIVE
Ketones, ur: NEGATIVE
Leukocytes, UA: NEGATIVE
Nitrite: NEGATIVE
Specific Gravity, Urine: <=1.005 — AB (ref 1.000–1.030)
Total Protein, Urine: NEGATIVE
UROBILINOGEN UA: 0.2 (ref 0.0–1.0)
Urine Glucose: NEGATIVE
pH: 6.5 (ref 5.0–8.0)

## 2016-03-13 LAB — LIPID PANEL
CHOL/HDL RATIO: 3
Cholesterol: 222 mg/dL — ABNORMAL HIGH (ref 0–200)
HDL: 64 mg/dL (ref >39.00)
LDL CALC: 140 mg/dL — AB (ref 0–99)
NonHDL: 158.09
TRIGLYCERIDES: 89 mg/dL (ref 0.0–149.0)
VLDL: 17.8 mg/dL (ref 0.0–40.0)

## 2016-03-13 LAB — BASIC METABOLIC PANEL
BUN: 11 mg/dL (ref 6–23)
CALCIUM: 9.7 mg/dL (ref 8.4–10.5)
CHLORIDE: 104 meq/L (ref 96–112)
CO2: 27 mEq/L (ref 19–32)
CREATININE: 0.72 mg/dL (ref 0.40–1.20)
GFR: 91.82 mL/min (ref >60.00)
Glucose, Bld: 105 mg/dL — ABNORMAL HIGH (ref 70–99)
Potassium: 3.5 mEq/L (ref 3.5–5.1)
Sodium: 137 mEq/L (ref 135–145)

## 2016-03-13 LAB — HEPATIC FUNCTION PANEL
ALT: 14 U/L (ref 0–35)
AST: 17 U/L (ref 0–37)
Albumin: 4.5 g/dL (ref 3.5–5.2)
Alkaline Phosphatase: 29 U/L — ABNORMAL LOW (ref 39–117)
BILIRUBIN DIRECT: 0.1 mg/dL (ref 0.0–0.3)
BILIRUBIN TOTAL: 0.3 mg/dL (ref 0.2–1.2)
Total Protein: 6.9 g/dL (ref 6.0–8.3)

## 2016-03-13 LAB — CBC WITH DIFFERENTIAL/PLATELET
BASOS PCT: 0.6 % (ref 0.0–3.0)
Basophils Absolute: 0.1 10*3/uL (ref 0.0–0.1)
EOS ABS: 0.1 10*3/uL (ref 0.0–0.7)
Eosinophils Relative: 0.6 % (ref 0.0–5.0)
HCT: 43.8 % (ref 36.0–46.0)
HEMOGLOBIN: 14.5 g/dL (ref 12.0–15.0)
Lymphocytes Relative: 24.5 % (ref 12.0–46.0)
Lymphs Abs: 2.2 10*3/uL (ref 0.7–4.0)
MCHC: 33.1 g/dL (ref 30.0–36.0)
MCV: 95.3 fl (ref 78.0–100.0)
MONO ABS: 0.7 10*3/uL (ref 0.1–1.0)
Monocytes Relative: 7.5 % (ref 3.0–12.0)
Neutro Abs: 6.1 10*3/uL (ref 1.4–7.7)
Neutrophils Relative %: 66.8 % (ref 43.0–77.0)
Platelets: 337 10*3/uL (ref 150.0–400.0)
RBC: 4.6 Mil/uL (ref 3.87–5.11)
RDW: 13.9 % (ref 11.5–15.5)
WBC: 9.1 10*3/uL (ref 4.0–10.5)

## 2016-03-13 MED ORDER — VENLAFAXINE HCL ER 75 MG PO CP24: 75.0000 mg | ORAL_CAPSULE | Freq: Every day | ORAL | Status: DC

## 2016-03-13 MED ORDER — AMLODIPINE BESYLATE 10 MG PO TABS: 10.0000 mg | ORAL_TABLET | Freq: Every day | ORAL | Status: DC

## 2016-03-13 MED ORDER — HYDROCHLOROTHIAZIDE 25 MG PO TABS
25.0000 mg | ORAL_TABLET | Freq: Every day | ORAL | Status: DC
Start: 2016-03-13 — End: 2016-07-26

## 2016-03-13 NOTE — Patient Instructions (Addendum)
Continue healthy diet, exercise. Try to increase your calories with goal of weight maintenance. Check your blood pressure once daily and record.  Send me your readings in 1 week via mychart. Complete lab work prior to leaving. Let me know when you are ready to quit smoking.   Preventive Care for Adults, Female A healthy lifestyle and preventive care can promote health and wellness. Preventive health guidelines for women include the following key practices.  A routine yearly physical is a good way to check with your health care provider about your health and preventive screening. It is a chance to share any concerns and updates on your health and to receive a thorough exam.  Visit your dentist for a routine exam and preventive care every 6 months. Brush your teeth twice a day and floss once a day. Good oral hygiene prevents tooth decay and gum disease.  The frequency of eye exams is based on your age, health, family medical history, use of contact lenses, and other factors. Follow your health care provider's recommendations for frequency of eye exams.  Eat a healthy diet. Foods like vegetables, fruits, whole grains, low-fat dairy products, and lean protein foods contain the nutrients you need without too many calories. Decrease your intake of foods high in solid fats, added sugars, and salt. Eat the right amount of calories for you.Get information about a proper diet from your health care provider, if necessary.  Regular physical exercise is one of the most important things you can do for your health. Most adults should get at least 150 minutes of moderate-intensity exercise (any activity that increases your heart rate and causes you to sweat) each week. In addition, most adults need muscle-strengthening exercises on 2 or more days a week.  Maintain a healthy weight. The body mass index (BMI) is a screening tool to identify possible weight problems. It provides an estimate of body fat based on  height and weight. Your health care provider can find your BMI and can help you achieve or maintain a healthy weight.For adults 20 years and older:  A BMI below 18.5 is considered underweight.  A BMI of 18.5 to 24.9 is normal.  A BMI of 25 to 29.9 is considered overweight.  A BMI of 30 and above is considered obese.  Maintain normal blood lipids and cholesterol levels by exercising and minimizing your intake of saturated fat. Eat a balanced diet with plenty of fruit and vegetables. Blood tests for lipids and cholesterol should begin at age 48 and be repeated every 5 years. If your lipid or cholesterol levels are high, you are over 50, or you are at high risk for heart disease, you may need your cholesterol levels checked more frequently.Ongoing high lipid and cholesterol levels should be treated with medicines if diet and exercise are not working.  If you smoke, find out from your health care provider how to quit. If you do not use tobacco, do not start.  Lung cancer screening is recommended for adults aged 44-80 years who are at high risk for developing lung cancer because of a history of smoking. A yearly low-dose CT scan of the lungs is recommended for people who have at least a 30-pack-year history of smoking and are a current smoker or have quit within the past 15 years. A pack year of smoking is smoking an average of 1 pack of cigarettes a day for 1 year (for example: 1 pack a day for 30 years or 2 packs a day for  15 years). Yearly screening should continue until the smoker has stopped smoking for at least 15 years. Yearly screening should be stopped for people who develop a health problem that would prevent them from having lung cancer treatment.  If you are pregnant, do not drink alcohol. If you are breastfeeding, be very cautious about drinking alcohol. If you are not pregnant and choose to drink alcohol, do not have more than 1 drink per day. One drink is considered to be 12 ounces (355  mL) of beer, 5 ounces (148 mL) of wine, or 1.5 ounces (44 mL) of liquor.  Avoid use of street drugs. Do not share needles with anyone. Ask for help if you need support or instructions about stopping the use of drugs.  High blood pressure causes heart disease and increases the risk of stroke. Your blood pressure should be checked at least every 1 to 2 years. Ongoing high blood pressure should be treated with medicines if weight loss and exercise do not work.  If you are 11-13 years old, ask your health care provider if you should take aspirin to prevent strokes.  Diabetes screening is done by taking a blood sample to check your blood glucose level after you have not eaten for a certain period of time (fasting). If you are not overweight and you do not have risk factors for diabetes, you should be screened once every 3 years starting at age 56. If you are overweight or obese and you are 46-55 years of age, you should be screened for diabetes every year as part of your cardiovascular risk assessment.  Breast cancer screening is essential preventive care for women. You should practice "breast self-awareness." This means understanding the normal appearance and feel of your breasts and may include breast self-examination. Any changes detected, no matter how small, should be reported to a health care provider. Women in their 61s and 30s should have a clinical breast exam (CBE) by a health care provider as part of a regular health exam every 1 to 3 years. After age 48, women should have a CBE every year. Starting at age 40, women should consider having a mammogram (breast X-ray test) every year. Women who have a family history of breast cancer should talk to their health care provider about genetic screening. Women at a high risk of breast cancer should talk to their health care providers about having an MRI and a mammogram every year.  Breast cancer gene (BRCA)-related cancer risk assessment is recommended for  women who have family members with BRCA-related cancers. BRCA-related cancers include breast, ovarian, tubal, and peritoneal cancers. Having family members with these cancers may be associated with an increased risk for harmful changes (mutations) in the breast cancer genes BRCA1 and BRCA2. Results of the assessment will determine the need for genetic counseling and BRCA1 and BRCA2 testing.  Your health care provider may recommend that you be screened regularly for cancer of the pelvic organs (ovaries, uterus, and vagina). This screening involves a pelvic examination, including checking for microscopic changes to the surface of your cervix (Pap test). You may be encouraged to have this screening done every 3 years, beginning at age 73.  For women ages 61-65, health care providers may recommend pelvic exams and Pap testing every 3 years, or they may recommend the Pap and pelvic exam, combined with testing for human papilloma virus (HPV), every 5 years. Some types of HPV increase your risk of cervical cancer. Testing for HPV may also be done  on women of any age with unclear Pap test results.  Other health care providers may not recommend any screening for nonpregnant women who are considered low risk for pelvic cancer and who do not have symptoms. Ask your health care provider if a screening pelvic exam is right for you.  If you have had past treatment for cervical cancer or a condition that could lead to cancer, you need Pap tests and screening for cancer for at least 20 years after your treatment. If Pap tests have been discontinued, your risk factors (such as having a new sexual partner) need to be reassessed to determine if screening should resume. Some women have medical problems that increase the chance of getting cervical cancer. In these cases, your health care provider may recommend more frequent screening and Pap tests.  Colorectal cancer can be detected and often prevented. Most routine colorectal  cancer screening begins at the age of 69 years and continues through age 46 years. However, your health care provider may recommend screening at an earlier age if you have risk factors for colon cancer. On a yearly basis, your health care provider may provide home test kits to check for hidden blood in the stool. Use of a small camera at the end of a tube, to directly examine the colon (sigmoidoscopy or colonoscopy), can detect the earliest forms of colorectal cancer. Talk to your health care provider about this at age 44, when routine screening begins. Direct exam of the colon should be repeated every 5-10 years through age 64 years, unless early forms of precancerous polyps or small growths are found.  People who are at an increased risk for hepatitis B should be screened for this virus. You are considered at high risk for hepatitis B if:  You were born in a country where hepatitis B occurs often. Talk with your health care provider about which countries are considered high risk.  Your parents were born in a high-risk country and you have not received a shot to protect against hepatitis B (hepatitis B vaccine).  You have HIV or AIDS.  You use needles to inject street drugs.  You live with, or have sex with, someone who has hepatitis B.  You get hemodialysis treatment.  You take certain medicines for conditions like cancer, organ transplantation, and autoimmune conditions.  Hepatitis C blood testing is recommended for all people born from [birth year] through 1965 and any individual with known risks for hepatitis C.  Practice safe sex. Use condoms and avoid high-risk sexual practices to reduce the spread of sexually transmitted infections (STIs). STIs include gonorrhea, chlamydia, syphilis, trichomonas, herpes, HPV, and human immunodeficiency virus (HIV). Herpes, HIV, and HPV are viral illnesses that have no cure. They can result in disability, cancer, and death.  You should be screened for sexually  transmitted illnesses (STIs) including gonorrhea and chlamydia if:  You are sexually active and are younger than 24 years.  You are older than 24 years and your health care provider tells you that you are at risk for this type of infection.  Your sexual activity has changed since you were last screened and you are at an increased risk for chlamydia or gonorrhea. Ask your health care provider if you are at risk.  If you are at risk of being infected with HIV, it is recommended that you take a prescription medicine daily to prevent HIV infection. This is called preexposure prophylaxis (PrEP). You are considered at risk if:  You are sexually active and  do not regularly use condoms or know the HIV status of your partner(s).  You take drugs by injection.  You are sexually active with a partner who has HIV.  Talk with your health care provider about whether you are at high risk of being infected with HIV. If you choose to begin PrEP, you should first be tested for HIV. You should then be tested every 3 months for as long as you are taking PrEP.  Osteoporosis is a disease in which the bones lose minerals and strength with aging. This can result in serious bone fractures or breaks. The risk of osteoporosis can be identified using a bone density scan. Women ages 75 years and over and women at risk for fractures or osteoporosis should discuss screening with their health care providers. Ask your health care provider whether you should take a calcium supplement or vitamin D to reduce the rate of osteoporosis.  Menopause can be associated with physical symptoms and risks. Hormone replacement therapy is available to decrease symptoms and risks. You should talk to your health care provider about whether hormone replacement therapy is right for you.  Use sunscreen. Apply sunscreen liberally and repeatedly throughout the day. You should seek shade when your shadow is shorter than you. Protect yourself by  wearing long sleeves, pants, a wide-brimmed hat, and sunglasses year round, whenever you are outdoors.  Once a month, do a whole body skin exam, using a mirror to look at the skin on your back. Tell your health care provider of new moles, moles that have irregular borders, moles that are larger than a pencil eraser, or moles that have changed in shape or color.  Stay current with required vaccines (immunizations).  Influenza vaccine. All adults should be immunized every year.  Tetanus, diphtheria, and acellular pertussis (Td, Tdap) vaccine. Pregnant women should receive 1 dose of Tdap vaccine during each pregnancy. The dose should be obtained regardless of the length of time since the last dose. Immunization is preferred during the 27th-36th week of gestation. An adult who has not previously received Tdap or who does not know her vaccine status should receive 1 dose of Tdap. This initial dose should be followed by tetanus and diphtheria toxoids (Td) booster doses every 10 years. Adults with an unknown or incomplete history of completing a 3-dose immunization series with Td-containing vaccines should begin or complete a primary immunization series including a Tdap dose. Adults should receive a Td booster every 10 years.  Varicella vaccine. An adult without evidence of immunity to varicella should receive 2 doses or a second dose if she has previously received 1 dose. Pregnant females who do not have evidence of immunity should receive the first dose after pregnancy. This first dose should be obtained before leaving the health care facility. The second dose should be obtained 4-8 weeks after the first dose.  Human papillomavirus (HPV) vaccine. Females aged 13-26 years who have not received the vaccine previously should obtain the 3-dose series. The vaccine is not recommended for use in pregnant females. However, pregnancy testing is not needed before receiving a dose. If a female is found to be pregnant  after receiving a dose, no treatment is needed. In that case, the remaining doses should be delayed until after the pregnancy. Immunization is recommended for any person with an immunocompromised condition through the age of 26 years if she did not get any or all doses earlier. During the 3-dose series, the second dose should be obtained 4-8 weeks after  the first dose. The third dose should be obtained 24 weeks after the first dose and 16 weeks after the second dose.  Zoster vaccine. One dose is recommended for adults aged 71 years or older unless certain conditions are present.  Measles, mumps, and rubella (MMR) vaccine. Adults born before 35 generally are considered immune to measles and mumps. Adults born in [birth year] or later should have 1 or more doses of MMR vaccine unless there is a contraindication to the vaccine or there is laboratory evidence of immunity to each of the three diseases. A routine second dose of MMR vaccine should be obtained at least 28 days after the first dose for students attending postsecondary schools, health care workers, or international travelers. People who received inactivated measles vaccine or an unknown type of measles vaccine during 1963-1967 should receive 2 doses of MMR vaccine. People who received inactivated mumps vaccine or an unknown type of mumps vaccine before 1979 and are at high risk for mumps infection should consider immunization with 2 doses of MMR vaccine. For females of childbearing age, rubella immunity should be determined. If there is no evidence of immunity, females who are not pregnant should be vaccinated. If there is no evidence of immunity, females who are pregnant should delay immunization until after pregnancy. Unvaccinated health care workers born before [birth year] who lack laboratory evidence of measles, mumps, or rubella immunity or laboratory confirmation of disease should consider measles and mumps immunization with 2 doses of MMR vaccine or rubella  immunization with 1 dose of MMR vaccine.  Pneumococcal 13-valent conjugate (PCV13) vaccine. When indicated, a person who is uncertain of his immunization history and has no record of immunization should receive the PCV13 vaccine. All adults 7 years of age and older should receive this vaccine. An adult aged 26 years or older who has certain medical conditions and has not been previously immunized should receive 1 dose of PCV13 vaccine. This PCV13 should be followed with a dose of pneumococcal polysaccharide (PPSV23) vaccine. Adults who are at high risk for pneumococcal disease should obtain the PPSV23 vaccine at least 8 weeks after the dose of PCV13 vaccine. Adults older than 48 years of age who have normal immune system function should obtain the PPSV23 vaccine dose at least 1 year after the dose of PCV13 vaccine.  Pneumococcal polysaccharide (PPSV23) vaccine. When PCV13 is also indicated, PCV13 should be obtained first. All adults aged 46 years and older should be immunized. An adult younger than age 9 years who has certain medical conditions should be immunized. Any person who resides in a nursing home or long-term care facility should be immunized. An adult smoker should be immunized. People with an immunocompromised condition and certain other conditions should receive both PCV13 and PPSV23 vaccines. People with human immunodeficiency virus (HIV) infection should be immunized as soon as possible after diagnosis. Immunization during chemotherapy or radiation therapy should be avoided. Routine use of PPSV23 vaccine is not recommended for American Indians, [place] Natives, or people younger than 65 years unless there are medical conditions that require PPSV23 vaccine. When indicated, people who have unknown immunization and have no record of immunization should receive PPSV23 vaccine. One-time revaccination 5 years after the first dose of PPSV23 is recommended for people aged 19-64 years who have chronic  kidney failure, nephrotic syndrome, asplenia, or immunocompromised conditions. People who received 1-2 doses of PPSV23 before age 13 years should receive another dose of PPSV23 vaccine at age 50 years or later if at least 2  years have passed since the previous dose. Doses of PPSV23 are not needed for people immunized with PPSV23 at or after age 56 years.  Meningococcal vaccine. Adults with asplenia or persistent complement component deficiencies should receive 2 doses of quadrivalent meningococcal conjugate (MenACWY-D) vaccine. The doses should be obtained at least 2 months apart. Microbiologists working with certain meningococcal bacteria, [employer] recruits, people at risk during an outbreak, and people who travel to or live in countries with a high rate of meningitis should be immunized. A first-year college student up through age 27 years who is living in a residence hall should receive a dose if she did not receive a dose on or after her 16th birthday. Adults who have certain high-risk conditions should receive one or more doses of vaccine.  Hepatitis A vaccine. Adults who wish to be protected from this disease, have certain high-risk conditions, work with hepatitis A-infected animals, work in hepatitis A research labs, or travel to or work in countries with a high rate of hepatitis A should be immunized. Adults who were previously unvaccinated and who anticipate close contact with an international adoptee during the first 60 days after arrival in the [place] from a country with a high rate of hepatitis A should be immunized.  Hepatitis B vaccine. Adults who wish to be protected from this disease, have certain high-risk conditions, may be exposed to blood or other infectious body fluids, are household contacts or sex partners of hepatitis B positive people, are clients or workers in certain care facilities, or travel to or work in countries with a high rate of hepatitis B should be  immunized.  Haemophilus influenzae type b (Hib) vaccine. A previously unvaccinated person with asplenia or sickle cell disease or having a scheduled splenectomy should receive 1 dose of Hib vaccine. Regardless of previous immunization, a recipient of a hematopoietic stem cell transplant should receive a 3-dose series 6-12 months after her successful transplant. Hib vaccine is not recommended for adults with HIV infection. Preventive Services / Frequency Ages 46 to 68 years  Blood pressure check.** / Every 3-5 years.  Lipid and cholesterol check.** / Every 5 years beginning at age 51.  Clinical breast exam.** / Every 3 years for women in their 72s and 65s.  BRCA-related cancer risk assessment.** / For women who have family members with a BRCA-related cancer (breast, ovarian, tubal, or peritoneal cancers).  Pap test.** / Every 2 years from ages 66 through 31. Every 3 years starting at age 46 through age 45 or 80 with a history of 3 consecutive normal Pap tests.  HPV screening.** / Every 3 years from ages 38 through ages 69 to 42 with a history of 3 consecutive normal Pap tests.  Hepatitis C blood test.** / For any individual with known risks for hepatitis C.  Skin self-exam. / Monthly.  Influenza vaccine. / Every year.  Tetanus, diphtheria, and acellular pertussis (Tdap, Td) vaccine.** / Consult your health care provider. Pregnant women should receive 1 dose of Tdap vaccine during each pregnancy. 1 dose of Td every 10 years.  Varicella vaccine.** / Consult your health care provider. Pregnant females who do not have evidence of immunity should receive the first dose after pregnancy.  HPV vaccine. / 3 doses over 6 months, if 36 and younger. The vaccine is not recommended for use in pregnant females. However, pregnancy testing is not needed before receiving a dose.  Measles, mumps, rubella (MMR) vaccine.** / You need at least 1 dose of MMR  if you were born in 1957 or later. You may also need  a 2nd dose. For females of childbearing age, rubella immunity should be determined. If there is no evidence of immunity, females who are not pregnant should be vaccinated. If there is no evidence of immunity, females who are pregnant should delay immunization until after pregnancy.  Pneumococcal 13-valent conjugate (PCV13) vaccine.** / Consult your health care provider.  Pneumococcal polysaccharide (PPSV23) vaccine.** / 1 to 2 doses if you smoke cigarettes or if you have certain conditions.  Meningococcal vaccine.** / 1 dose if you are age 37 to 74 years and a Market researcher living in a residence hall, or have one of several medical conditions, you need to get vaccinated against meningococcal disease. You may also need additional booster doses.  Hepatitis A vaccine.** / Consult your health care provider.  Hepatitis B vaccine.** / Consult your health care provider.  Haemophilus influenzae type b (Hib) vaccine.** / Consult your health care provider. Ages 63 to 36 years  Blood pressure check.** / Every year.  Lipid and cholesterol check.** / Every 5 years beginning at age 46 years.  Lung cancer screening. / Every year if you are aged 67-80 years and have a 30-pack-year history of smoking and currently smoke or have quit within the past 15 years. Yearly screening is stopped once you have quit smoking for at least 15 years or develop a health problem that would prevent you from having lung cancer treatment.  Clinical breast exam.** / Every year after age 67 years.  BRCA-related cancer risk assessment.** / For women who have family members with a BRCA-related cancer (breast, ovarian, tubal, or peritoneal cancers).  Mammogram.** / Every year beginning at age 35 years and continuing for as long as you are in good health. Consult with your health care provider.  Pap test.** / Every 3 years starting at age 32 years through age 62 or 65 years with a history of 3 consecutive normal Pap  tests.  HPV screening.** / Every 3 years from ages 88 years through ages 73 to 84 years with a history of 3 consecutive normal Pap tests.  Fecal occult blood test (FOBT) of stool. / Every year beginning at age 7 years and continuing until age 49 years. You may not need to do this test if you get a colonoscopy every 10 years.  Flexible sigmoidoscopy or colonoscopy.** / Every 5 years for a flexible sigmoidoscopy or every 10 years for a colonoscopy beginning at age 14 years and continuing until age 52 years.  Hepatitis C blood test.** / For all people born from [birth year] through 1963 and any individual with known risks for hepatitis C.  Skin self-exam. / Monthly.  Influenza vaccine. / Every year.  Tetanus, diphtheria, and acellular pertussis (Tdap/Td) vaccine.** / Consult your health care provider. Pregnant women should receive 1 dose of Tdap vaccine during each pregnancy. 1 dose of Td every 10 years.  Varicella vaccine.** / Consult your health care provider. Pregnant females who do not have evidence of immunity should receive the first dose after pregnancy.  Zoster vaccine.** / 1 dose for adults aged 56 years or older.  Measles, mumps, rubella (MMR) vaccine.** / You need at least 1 dose of MMR if you were born in 1957 or later. You may also need a second dose. For females of childbearing age, rubella immunity should be determined. If there is no evidence of immunity, females who are not pregnant should be vaccinated. If there is  no evidence of immunity, females who are pregnant should delay immunization until after pregnancy.  Pneumococcal 13-valent conjugate (PCV13) vaccine.** / Consult your health care provider.  Pneumococcal polysaccharide (PPSV23) vaccine.** / 1 to 2 doses if you smoke cigarettes or if you have certain conditions.  Meningococcal vaccine.** / Consult your health care provider.  Hepatitis A vaccine.** / Consult your health care provider.  Hepatitis B vaccine.** / Consult  your health care provider.  Haemophilus influenzae type b (Hib) vaccine.** / Consult your health care provider. Ages 30 years and over  Blood pressure check.** / Every year.  Lipid and cholesterol check.** / Every 5 years beginning at age 64 years.  Lung cancer screening. / Every year if you are aged 41-80 years and have a 30-pack-year history of smoking and currently smoke or have quit within the past 15 years. Yearly screening is stopped once you have quit smoking for at least 15 years or develop a health problem that would prevent you from having lung cancer treatment.  Clinical breast exam.** / Every year after age 56 years.  BRCA-related cancer risk assessment.** / For women who have family members with a BRCA-related cancer (breast, ovarian, tubal, or peritoneal cancers).  Mammogram.** / Every year beginning at age 43 years and continuing for as long as you are in good health. Consult with your health care provider.  Pap test.** / Every 3 years starting at age 67 years through age 41 or 32 years with 3 consecutive normal Pap tests. Testing can be stopped between 65 and 70 years with 3 consecutive normal Pap tests and no abnormal Pap or HPV tests in the past 10 years.  HPV screening.** / Every 3 years from ages 46 years through ages 29 or 6 years with a history of 3 consecutive normal Pap tests. Testing can be stopped between 65 and 70 years with 3 consecutive normal Pap tests and no abnormal Pap or HPV tests in the past 10 years.  Fecal occult blood test (FOBT) of stool. / Every year beginning at age 27 years and continuing until age 4 years. You may not need to do this test if you get a colonoscopy every 10 years.  Flexible sigmoidoscopy or colonoscopy.** / Every 5 years for a flexible sigmoidoscopy or every 10 years for a colonoscopy beginning at age 24 years and continuing until age 82 years.  Hepatitis C blood test.** / For all people born from [birth year] through 1963 and any  individual with known risks for hepatitis C.  Osteoporosis screening.** / A one-time screening for women ages 62 years and over and women at risk for fractures or osteoporosis.  Skin self-exam. / Monthly.  Influenza vaccine. / Every year.  Tetanus, diphtheria, and acellular pertussis (Tdap/Td) vaccine.** / 1 dose of Td every 10 years.  Varicella vaccine.** / Consult your health care provider.  Zoster vaccine.** / 1 dose for adults aged 54 years or older.  Pneumococcal 13-valent conjugate (PCV13) vaccine.** / Consult your health care provider.  Pneumococcal polysaccharide (PPSV23) vaccine.** / 1 dose for all adults aged 70 years and older.  Meningococcal vaccine.** / Consult your health care provider.  Hepatitis A vaccine.** / Consult your health care provider.  Hepatitis B vaccine.** / Consult your health care provider.  Haemophilus influenzae type b (Hib) vaccine.** / Consult your health care provider. ** Family history and personal history of risk and conditions may change your health care provider's recommendations.   This information is not intended to replace advice given  to you by your health care provider. Make sure you discuss any questions you have with your health care provider.   Document Released: 10/31/2001 Document Revised: 09/25/2014 Document Reviewed: 01/30/2011 Elsevier Interactive Patient Education [employer].

## 2016-03-13 NOTE — Assessment & Plan Note (Signed)
Improved on effexor, continue same.

## 2016-03-13 NOTE — Addendum Note (Signed)
Addended by: Kelle Darting A on: 03/13/2016 01:54 PM   Modules accepted: Orders, SmartSet

## 2016-03-13 NOTE — Assessment & Plan Note (Addendum)
BP is acceptable. Continue current medications. I have asked pt to check bp daily at home and send me bp readings via mychart in 1 week.

## 2016-03-13 NOTE — Progress Notes (Signed)
Pre visit review using our clinic review tool, if applicable. No additional management support is needed unless otherwise documented below in the visit note. 

## 2016-03-13 NOTE — Assessment & Plan Note (Addendum)
Discussed healthy diet, exercise. Discussed adding some higher calorie snacks (nuts/dried fruits) to help with weight maintenance.  She is currently at a good BMI.  Spoke at length re: importance of tobacco cessation. Specifically discussed chantix and common side effects.  She is not ready to quit but is considering and will let me know.

## 2016-03-13 NOTE — Addendum Note (Signed)
Addended by: Debbrah Alar on: 03/13/2016 10:53 AM   Modules accepted: Orders, SmartSet

## 2016-03-13 NOTE — Progress Notes (Addendum)
Subjective:    Patient ID: Alexis English, female    DOB: April 21, 1966, 50 y.o.   MRN: [id number]  HPI  Alexis English is a 50 yr old female who presents today for cpx.  Immunizations: due for tetanus Diet: healthy (working on low carb diet) Exercise: walking Pap Smear: 11/16 Mammogram: 02/21/16 Tobacco abuse: 1/2 PPD, has tried nicorette, vapor cigarette, patch.  HTN- last visit the patient had not taken her AM meds.    HTN- Reports DBP at home is often around 90.  BP Readings from Last 3 Encounters:  03/13/16 130/90  02/22/16 140/100  02/11/16 131/97   Anxiety- last visit she reported inability to tolerate buspar and was having significant anxiety symptoms. She was placed on effexor. Reports that effexor has helped tremendously.   Reports dry, flaking lesion on chest.   Review of Systems  Constitutional: Negative for unexpected weight change.  HENT: Negative for hearing loss.   Eyes:       Needs reading glasses  Respiratory:       "smoker's cough"  Cardiovascular: Negative for leg swelling.  Gastrointestinal: Negative for diarrhea and constipation.  Genitourinary:       On depo, no current menses  Musculoskeletal: Negative for myalgias and arthralgias.  Skin: Negative for rash.  Neurological: Negative for headaches.  Hematological: Negative for adenopathy.  Psychiatric/Behavioral:       See HPI       Past Medical History  Diagnosis Date  . History of kidney stones   . Ruptured disc, cervical     C4-5; cervical fusion  . Depression   . Pneumonia 08/2010  . Pulmonary nodule 08/2010    per CT     Social History   Social History  . Marital Status: Divorced    Spouse Name: N/A  . Number of Children: 2  . Years of Education: N/A   Occupational History  . 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 Smoking status: Current Every Day Smoker -- 0.50 packs/day for 26 years  . Smokeless tobacco: Not on file     Comment: trying to  stop smoking  . Alcohol Use: 0.0 oz/week    0 Standard drinks or equivalent per week     Comment: 4 x weekly  . Drug Use: Not on file  . Sexual Activity: Not on file   Other Topics Concern  . Not on file   Social History Narrative   Regular exercise:  No          Past Surgical History  Procedure Laterality Date  . Cervical fusion  2000  . Cesarean section      x2  . Abdominal hernia repair      as a child  . Cystectomy      from back x 2    Family History  Problem Relation Age of Onset  . Heart attack Mother   . Arthritis Mother   . Anxiety disorder Mother   . Heart attack Father     Allergies  Allergen Reactions  . Bupropion Hcl     REACTION: hives  . Buspar [Buspirone] Other (See Comments)    Nausea, dizziness, weakness, muscle cramps  . Propoxyphene N-Acetaminophen     REACTION: hives    Current Outpatient Prescriptions on File Prior to Visit  Medication Sig Dispense Refill  . ALPRAZolam (XANAX) 0.5 MG tablet Take 0.5 mg by mouth as needed.  0  .  amLODipine (NORVASC) 10 MG tablet Take 1 tablet (10 mg total) by mouth daily. 30 tablet 1  . hydrochlorothiazide (HYDRODIURIL) 25 MG tablet Take 1 tablet (25 mg total) by mouth daily. 30 tablet 0  . medroxyPROGESTERone (DEPO-PROVERA) 150 MG/ML injection Inject 1 mL (150 mg total) into the muscle every 3 (three) months. 1 mL   . venlafaxine (EFFEXOR) 37.5 MG tablet Take 1 tablet (37.5 mg total) by mouth 2 (two) times daily. 60 tablet 2  . venlafaxine (EFFEXOR) 37.5 MG tablet One tab once daily for 3 days then increase to 1 tab twice daily 60 tablet 0   No current facility-administered medications on file prior to visit.    BP 126/86 mmHg  Pulse 89  Temp(Src) 98.1 F (36.7 C) (Oral)  Resp 18  Ht 5' 4.5" (1.638 m)  Wt 124 lb 12.8 oz (56.609 kg)  BMI 21.10 kg/m2  SpO2 98%    Objective:   Physical Exam  Physical Exam  Constitutional: She is oriented to person, place, and time. She appears well-developed  and well-nourished. No distress.  HENT:  Head: Normocephalic and atraumatic.  Right Ear: Tympanic membrane and ear canal normal.  Left Ear: Tympanic membrane and ear canal normal.  Mouth/Throat: Oropharynx is clear and moist.  Eyes: Pupils are equal, round, and reactive to light. No scleral icterus.  Neck: Normal range of motion. No thyromegaly present.  Cardiovascular: Normal rate and regular rhythm.   No murmur heard. Pulmonary/Chest: Effort normal and breath sounds normal. No respiratory distress. He has no wheezes. She has no rales. She exhibits no tenderness.  Abdominal: Soft. Bowel sounds are normal. He exhibits no distension and no mass. There is no tenderness. There is no rebound and no guarding.  Musculoskeletal: She exhibits no edema.  Lymphadenopathy:    She has no cervical adenopathy.  Neurological: She is alert and oriented to person, place, and time. She has normal patellar reflexes. She exhibits normal muscle tone. Coordination normal.  Skin: Skin is warm and dry. has a small, raised dry lesion noted on chest wall. Skin appears sun damaged on chest Psychiatric: She has a normal mood and affect. Her behavior is normal. Judgment and thought content normal.  Breast/pelvic: deferred to GYN          Assessment & Plan:         Assessment & Plan:  EKG tracing is personally reviewed.  EKG notes NSR.  No acute changes.  Tdap today.   Skin lesion- will refer to derm. Requests Dr. Allyson Sabal.

## 2016-03-15 ENCOUNTER — Encounter: Payer: Self-pay | Admitting: Family

## 2016-03-22 DIAGNOSIS — Z309 Encounter for contraceptive management, unspecified: Secondary | ICD-10-CM | POA: Diagnosis not present

## 2016-04-04 DIAGNOSIS — L814 Other melanin hyperpigmentation: Secondary | ICD-10-CM | POA: Diagnosis not present

## 2016-04-04 DIAGNOSIS — D225 Melanocytic nevi of trunk: Secondary | ICD-10-CM | POA: Diagnosis not present

## 2016-04-04 DIAGNOSIS — L700 Acne vulgaris: Secondary | ICD-10-CM | POA: Diagnosis not present

## 2016-04-04 DIAGNOSIS — L821 Other seborrheic keratosis: Secondary | ICD-10-CM | POA: Diagnosis not present

## 2016-04-04 DIAGNOSIS — L570 Actinic keratosis: Secondary | ICD-10-CM | POA: Diagnosis not present

## 2016-04-17 ENCOUNTER — Encounter: Payer: Self-pay | Admitting: Family

## 2016-05-30 DIAGNOSIS — L03019 Cellulitis of unspecified finger: Secondary | ICD-10-CM | POA: Diagnosis not present

## 2016-05-30 DIAGNOSIS — L820 Inflamed seborrheic keratosis: Secondary | ICD-10-CM | POA: Diagnosis not present

## 2016-05-30 DIAGNOSIS — L281 Prurigo nodularis: Secondary | ICD-10-CM | POA: Diagnosis not present

## 2016-06-03 DIAGNOSIS — H66002 Acute suppurative otitis media without spontaneous rupture of ear drum, left ear: Secondary | ICD-10-CM | POA: Diagnosis not present

## 2016-06-03 DIAGNOSIS — J069 Acute upper respiratory infection, unspecified: Secondary | ICD-10-CM | POA: Diagnosis not present

## 2016-06-08 DIAGNOSIS — Z3042 Encounter for surveillance of injectable contraceptive: Secondary | ICD-10-CM | POA: Diagnosis not present

## 2016-07-26 ENCOUNTER — Other Ambulatory Visit: Payer: Self-pay | Admitting: Family

## 2016-08-25 DIAGNOSIS — Z01419 Encounter for gynecological examination (general) (routine) without abnormal findings: Secondary | ICD-10-CM | POA: Diagnosis not present

## 2016-08-25 DIAGNOSIS — Z3042 Encounter for surveillance of injectable contraceptive: Secondary | ICD-10-CM | POA: Diagnosis not present

## 2016-09-03 ENCOUNTER — Other Ambulatory Visit: Payer: Self-pay | Admitting: Family

## 2016-09-04 NOTE — Telephone Encounter (Signed)
[facility] Venlaf[facility]ine 75mg  XR Last filled - 03/13/16, #90x1 Last AEX - 03/13/16 Next AEX - 3-Mts; pt has not scheduled ROV [facility], patient filled first #90 on 03/13/16, then pharmacy refilled [one week early] her available refill on 06/06/16.  Please Advise on any refills prior to [facility] send note to scheduler as well] with your reply/SLS 12/18

## 2016-09-04 NOTE — Telephone Encounter (Signed)
Needs follow up office visit before any more refills

## 2016-09-06 ENCOUNTER — Telehealth: Payer: Self-pay | Admitting: Family

## 2016-09-06 MED ORDER — VENLAFAXINE HCL ER 75 MG PO CP24: 75.0000 mg | ORAL_CAPSULE | Freq: Every day | ORAL | 0 refills | Status: DC

## 2016-09-06 NOTE — Telephone Encounter (Signed)
Caller name: Relationship to patient: Self Can be reached:  [phone]  Ph[facility]:  [id number] [place], [place] [phone] (Phone) [phone] (Fax     Reason for call: Refill on venlafaxine XR (EFFEXOR XR) 75 MG 24 hr capsule [id number]

## 2016-09-06 NOTE — Telephone Encounter (Signed)
Advised patient that she needs to make an office visit before anymore refills are done.  She states that she will call back after the new year starts.  She has been to a a lot of different doctors and racked up medical bills.  Her gynecologist refilled and changed dose on Effexor.  Should be receiving note from her gyn.  Advised her that she must adhere to follow ups in order to get her refills and why they are important.

## 2016-09-07 NOTE — Telephone Encounter (Signed)
Refill sent, due for follow up in January.

## 2016-11-15 DIAGNOSIS — Z3042 Encounter for surveillance of injectable contraceptive: Secondary | ICD-10-CM | POA: Diagnosis not present

## 2017-01-22 ENCOUNTER — Encounter: Payer: Self-pay | Admitting: Family

## 2017-01-22 ENCOUNTER — Ambulatory Visit (INDEPENDENT_AMBULATORY_CARE_PROVIDER_SITE_OTHER): Payer: BLUE CROSS/BLUE SHIELD | Admitting: Family

## 2017-01-22 VITALS — BP 136/90 | HR 78 | Temp 98.8°F | Resp 16 | Ht 64.5 in | Wt 123.8 lb

## 2017-01-22 DIAGNOSIS — F411 Generalized anxiety disorder: Secondary | ICD-10-CM | POA: Diagnosis not present

## 2017-01-22 DIAGNOSIS — I10 Essential (primary) hypertension: Secondary | ICD-10-CM

## 2017-01-22 DIAGNOSIS — R911 Solitary pulmonary nodule: Secondary | ICD-10-CM | POA: Diagnosis not present

## 2017-01-22 DIAGNOSIS — Z720 Tobacco use: Secondary | ICD-10-CM | POA: Diagnosis not present

## 2017-01-22 LAB — BASIC METABOLIC PANEL
BUN: 12 mg/dL (ref 6–23)
CHLORIDE: 102 meq/L (ref 96–112)
CO2: 26 mEq/L (ref 19–32)
Calcium: 9.6 mg/dL (ref 8.4–10.5)
Creatinine, Ser: 0.76 mg/dL (ref 0.40–1.20)
GFR: 85.96 mL/min (ref >60.00)
Glucose, Bld: 85 mg/dL (ref 70–99)
POTASSIUM: 3.1 meq/L — AB (ref 3.5–5.1)
Sodium: 136 mEq/L (ref 135–145)

## 2017-01-22 MED ORDER — VARENICLINE TARTRATE 0.5 MG X 11 & 1 MG X 42 PO MISC: ORAL | 0 refills | Status: DC

## 2017-01-22 MED ORDER — AMLODIPINE BESYLATE 10 MG PO TABS: 10.0000 mg | ORAL_TABLET | Freq: Every day | ORAL | 1 refills | Status: DC

## 2017-01-22 MED ORDER — HYDROCHLOROTHIAZIDE 25 MG PO TABS: 25.0000 mg | ORAL_TABLET | Freq: Every day | ORAL | 0 refills | Status: DC

## 2017-01-22 NOTE — Assessment & Plan Note (Signed)
Trial of chantix. Common side effects including rare risk of suicide ideation was discussed with the patient today.  Patient is instructed to go directly to the ED if this occurs.  We discussed that patient can continue to smoke for 1 week after starting chantix, but then must discontinue cigarettes.  He is also instructed to contact us prior to completion of the starter month pack for an rx for the continuation month pack.  5 minutes spent with patient today on tobacco cessation counseling.   

## 2017-01-22 NOTE — Assessment & Plan Note (Signed)
BP is mildly elevated today. She reports better blood pressures at [facility] office and I did see this at her last depo injection. Suspect some mild white coat hypertension.  Continue current meds.  Monitor.

## 2017-01-22 NOTE — Progress Notes (Signed)
Subjective:    Patient ID: Alexis English, female    DOB: July 04, 1966, 51 y.o.   MRN: [id number]  HPI  Alexis English is a 51 yr old female who presents today for follow up.   HTN-on amlodipine, denies CP/SOB or swelling.  Also on hctz.   BP Readings from Last 3 Encounters:  01/22/17 136/90  03/13/16 126/86  02/22/16 (!) 140/100   Anxiety- Reports that her mother passed away this year. Reports that OB increased effexor due to night sweats. Reports anxiety is well controlled.   Tobacco abuse- continues to smoke. Wants to quit.    Review of Systems    see HPI  Past Medical History:  Diagnosis Date  . Depression   . History of kidney stones   . Pneumonia 08/2010  . Pulmonary nodule 08/2010   per CT  . Ruptured disc, cervical    C4-5; cervical fusion     Social History   Social History  . Marital status: Divorced    Spouse name: N/A  . Number of children: 2  . Years of education: N/A   Occupational History  . 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 Smoking status: Current Every Day Smoker    Packs/day: 0.50    Years: 26.00  . Smokeless tobacco: Never Used     Comment: trying to stop smoking  . Alcohol use 0.0 oz/week     Comment: 4 x weekly  . Drug use: Unknown  . Sexual activity: Not on file   Other Topics Concern  . Not on file   Social History Narrative   Regular exercise:  No          Past Surgical History:  Procedure Laterality Date  . ABDOMINAL HERNIA REPAIR     as a child  . CERVICAL FUSION  2000  . CESAREAN SECTION     x2  . CYSTECTOMY     from back x 2    Family History  Problem Relation Age of Onset  . Heart attack Mother   . Arthritis Mother   . Anxiety disorder Mother   . Heart attack Father     Allergies  Allergen Reactions  . Hydrocodone Itching  . Bupropion Hcl     REACTION: hives  . Buspar [Buspirone] Other (See Comments)    Nausea, dizziness, weakness, muscle  cramps  . Propoxyphene N-Acetaminophen     REACTION: hives    Current Outpatient Prescriptions on File Prior to Visit  Medication Sig Dispense Refill  . ALPRAZolam (XANAX) 0.5 MG tablet Take 0.5 mg by mouth as needed.  0  . hydrochlorothiazide (HYDRODIURIL) 25 MG tablet TAKE 1 TABLET BY MOUTH EVERY DAY 90 tablet 0  . medroxyPROGESTERone (DEPO-PROVERA) 150 MG/ML injection Inject 1 mL (150 mg total) into the muscle every 3 (three) months. 1 mL   . venlafaxine XR (EFFEXOR XR) 75 MG 24 hr capsule Take 1 capsule (75 mg total) by mouth daily with breakfast. 90 capsule 0  . amLODipine (NORVASC) 10 MG tablet TAKE 1 TABLET (10 MG TOTAL) BY MOUTH DAILY. (Patient not taking: Reported on 01/22/2017) 90 tablet 0   No current facility-administered medications on file prior to visit.     BP 136/90 (BP Location: Right Arm, Cuff Size: Normal)   Pulse 78   Temp 98.8 F (37.1 C) (Oral)   Resp 16   Ht 5' 4.5" (1.638 m)   Wt 123  lb 12.8 oz (56.2 kg)   SpO2 100% Comment: room air  BMI 20.92 kg/m    Objective:   Physical Exam  Constitutional: She is oriented to person, place, and time. She appears well-developed and well-nourished.  HENT:  Head: Normocephalic and atraumatic.  Cardiovascular: Normal rate, regular rhythm and normal heart sounds.   No murmur heard. Pulmonary/Chest: Effort normal and breath sounds normal. No respiratory distress. She has no wheezes.  Musculoskeletal: She exhibits no edema.  Neurological: She is alert and oriented to person, place, and time.  Psychiatric: She has a normal mood and affect. Her behavior is normal. Judgment and thought content normal.          Assessment & Plan:

## 2017-01-22 NOTE — Assessment & Plan Note (Addendum)
Stable on effexor, continue same. 

## 2017-01-22 NOTE — Progress Notes (Signed)
Pre visit review using our clinic review tool, if applicable. No additional management support is needed unless otherwise documented below in the visit note. 

## 2017-01-22 NOTE — Patient Instructions (Signed)
Please complete lab work prior to leaving.  Good luck quitting smoking.

## 2017-01-24 ENCOUNTER — Encounter: Payer: Self-pay | Admitting: Family

## 2017-01-24 ENCOUNTER — Other Ambulatory Visit: Payer: Self-pay | Admitting: Family

## 2017-01-24 DIAGNOSIS — E876 Hypokalemia: Secondary | ICD-10-CM

## 2017-01-24 MED ORDER — POTASSIUM CHLORIDE CRYS ER 20 MEQ PO TBCR: 20.0000 meq | EXTENDED_RELEASE_TABLET | Freq: Every day | ORAL | 2 refills | Status: DC

## 2017-01-31 ENCOUNTER — Other Ambulatory Visit (INDEPENDENT_AMBULATORY_CARE_PROVIDER_SITE_OTHER): Payer: BLUE CROSS/BLUE SHIELD

## 2017-01-31 DIAGNOSIS — E876 Hypokalemia: Secondary | ICD-10-CM | POA: Diagnosis not present

## 2017-01-31 LAB — BASIC METABOLIC PANEL
BUN: 13 mg/dL (ref 6–23)
CHLORIDE: 104 meq/L (ref 96–112)
CO2: 25 mEq/L (ref 19–32)
Calcium: 9.7 mg/dL (ref 8.4–10.5)
Creatinine, Ser: 0.82 mg/dL (ref 0.40–1.20)
GFR: 78.73 mL/min (ref >60.00)
Glucose, Bld: 94 mg/dL (ref 70–99)
POTASSIUM: 3.5 meq/L (ref 3.5–5.1)
SODIUM: 137 meq/L (ref 135–145)

## 2017-02-01 DIAGNOSIS — Z3042 Encounter for surveillance of injectable contraceptive: Secondary | ICD-10-CM | POA: Diagnosis not present

## 2017-02-02 ENCOUNTER — Encounter: Payer: Self-pay | Admitting: Family

## 2017-02-06 ENCOUNTER — Encounter: Payer: Self-pay | Admitting: Family

## 2017-02-07 ENCOUNTER — Other Ambulatory Visit: Payer: Self-pay | Admitting: Family

## 2017-03-27 ENCOUNTER — Other Ambulatory Visit: Payer: Self-pay | Admitting: *Deleted

## 2017-03-27 DIAGNOSIS — E876 Hypokalemia: Secondary | ICD-10-CM

## 2017-03-27 MED ORDER — POTASSIUM CHLORIDE CRYS ER 20 MEQ PO TBCR: 20.0000 meq | EXTENDED_RELEASE_TABLET | Freq: Every day | ORAL | 0 refills | Status: DC

## 2017-03-27 NOTE — Progress Notes (Signed)
[facility] Klor-Con 20 mg tab, 90-day supply Last filled by MD on 01/24/17, #30x2 Last AEX - 01/22/17 Refill sent per [facility] refill protocol/SLS D/C PREVIOUS SCRIPTS FOR THIS MEDICATION

## 2017-04-22 ENCOUNTER — Other Ambulatory Visit: Payer: Self-pay | Admitting: Family

## 2017-04-23 DIAGNOSIS — Z3042 Encounter for surveillance of injectable contraceptive: Secondary | ICD-10-CM | POA: Diagnosis not present

## 2017-04-23 NOTE — Telephone Encounter (Signed)
Rx sent to pharmacy. LB 

## 2017-07-12 DIAGNOSIS — Z3042 Encounter for surveillance of injectable contraceptive: Secondary | ICD-10-CM | POA: Diagnosis not present

## 2017-07-22 ENCOUNTER — Other Ambulatory Visit: Payer: Self-pay | Admitting: Family

## 2017-07-22 DIAGNOSIS — E876 Hypokalemia: Secondary | ICD-10-CM

## 2017-08-14 ENCOUNTER — Other Ambulatory Visit: Payer: Self-pay | Admitting: Family

## 2017-08-16 ENCOUNTER — Other Ambulatory Visit: Payer: Self-pay | Admitting: Family

## 2017-08-28 ENCOUNTER — Encounter: Payer: Self-pay | Admitting: Family

## 2017-08-28 DIAGNOSIS — E876 Hypokalemia: Secondary | ICD-10-CM

## 2017-08-29 MED ORDER — AMLODIPINE BESYLATE 10 MG PO TABS: 10.0000 mg | ORAL_TABLET | Freq: Every day | ORAL | 0 refills | Status: DC

## 2017-08-29 MED ORDER — VENLAFAXINE HCL ER 75 MG PO CP24: 75.0000 mg | ORAL_CAPSULE | Freq: Every day | ORAL | 0 refills | Status: DC

## 2017-08-29 MED ORDER — HYDROCHLOROTHIAZIDE 25 MG PO TABS: 25.0000 mg | ORAL_TABLET | Freq: Every day | ORAL | 0 refills | Status: DC

## 2017-08-29 MED ORDER — POTASSIUM CHLORIDE CRYS ER 20 MEQ PO TBCR: 20.0000 meq | EXTENDED_RELEASE_TABLET | Freq: Every day | ORAL | 0 refills | Status: DC

## 2017-09-28 ENCOUNTER — Ambulatory Visit: Payer: BLUE CROSS/BLUE SHIELD | Admitting: Family

## 2017-09-28 ENCOUNTER — Encounter: Payer: Self-pay | Admitting: Family

## 2017-09-28 VITALS — BP 126/84 | HR 81 | Temp 98.6°F | Resp 16 | Ht 64.5 in | Wt 132.0 lb

## 2017-09-28 DIAGNOSIS — Z720 Tobacco use: Secondary | ICD-10-CM

## 2017-09-28 DIAGNOSIS — F411 Generalized anxiety disorder: Secondary | ICD-10-CM

## 2017-09-28 DIAGNOSIS — I10 Essential (primary) hypertension: Secondary | ICD-10-CM | POA: Diagnosis not present

## 2017-09-28 DIAGNOSIS — Z0000 Encounter for general adult medical examination without abnormal findings: Secondary | ICD-10-CM

## 2017-09-28 LAB — BASIC METABOLIC PANEL
BUN: 14 mg/dL (ref 6–23)
CO2: 27 mEq/L (ref 19–32)
CREATININE: 0.78 mg/dL (ref 0.40–1.20)
Calcium: 9.2 mg/dL (ref 8.4–10.5)
Chloride: 101 mEq/L (ref 96–112)
GFR: 83.19 mL/min (ref >60.00)
Glucose, Bld: 102 mg/dL — ABNORMAL HIGH (ref 70–99)
Potassium: 3.3 mEq/L — ABNORMAL LOW (ref 3.5–5.1)
Sodium: 136 mEq/L (ref 135–145)

## 2017-09-28 NOTE — Patient Instructions (Signed)
Please complete lab work prior to leaving.   

## 2017-09-28 NOTE — Progress Notes (Signed)
Subjective:    Patient ID: Alexis English, female    DOB: Aug 07, 1966, 52 y.o.   MRN: [id number]  HPI  Patient is a 52 yr old female who presents today for follow up.  1) HTN- maintained on amlodipine, hctz.  BP Readings from Last 3 Encounters:  09/28/17 126/84  01/22/17 136/90  03/13/16 126/86   2) anxiety- maintained on effexor which was increased previously by her OB/GYN due to night sweats.   3) Tobacco abuse- Last visit we gave her a trial of chantix.  She did not fill chantix.  She is smoking 1/2 PPD.    Review of Systems See HPI  Past Medical History:  Diagnosis Date  . Depression   . History of kidney stones   . Pneumonia 08/2010  . Pulmonary nodule 08/2010   per CT  . Ruptured disc, cervical    C4-5; cervical fusion     Social History   Socioeconomic History  . Marital status: Divorced    Spouse name: Not on file  . Number of children: 2  . Years of education: Not on file  . Highest education level: Not on file  Social Needs  . Financial resource strain: Not on file  . Food insecurity - worry: Not on file  . Food insecurity - inability: Not on file  . Transportation needs - medical: Not on file  . Transportation needs - non-medical: Not on file  Occupational History  . 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 Smoking status: Current Every Day Smoker    Packs/day: 0.50    Years: 26.00    Pack years: 13.00  . Smokeless tobacco: Never Used  . Tobacco comment: trying to stop smoking  Substance and Sexual Activity  . Alcohol use: Yes    Alcohol/week: 0.0 oz    Comment: 4 x weekly  . Drug use: Not on file  . Sexual activity: Not on file  Other Topics Concern  . Not on file  Social History Narrative   Regular exercise:  No       Past Surgical History:  Procedure Laterality Date  . ABDOMINAL HERNIA REPAIR     as a child  . CERVICAL FUSION  2000  . CESAREAN SECTION     x2  . CYSTECTOMY     from back x 2    Family History  Problem Relation Age of Onset  . Heart attack Mother   . Arthritis Mother   . Anxiety disorder Mother   . Heart attack Father     Allergies  Allergen Reactions  . Hydrocodone Itching  . Bupropion Hcl     REACTION: hives  . Buspar [Buspirone] Other (See Comments)    Nausea, dizziness, weakness, muscle cramps  . Propoxyphene N-Acetaminophen     REACTION: hives    Current Outpatient Medications on File Prior to Visit  Medication Sig Dispense Refill  . ALPRAZolam (XANAX) 0.5 MG tablet Take 0.5 mg by mouth as needed.  0  . amLODipine (NORVASC) 10 MG tablet Take 1 tablet (10 mg total) by mouth daily. 30 tablet 0  . hydrochlorothiazide (HYDRODIURIL) 25 MG tablet Take 1 tablet (25 mg total) by mouth daily. 30 tablet 0  . medroxyPROGESTERone (DEPO-PROVERA) 150 MG/ML injection Inject 1 mL (150 mg total) into the muscle every 3 (three) months. 1 mL   . potassium chloride SA (KLOR-CON M20) 20 MEQ tablet Take  1 tablet (20 mEq total) by mouth daily. 30 tablet 0  . venlafaxine XR (EFFEXOR-XR) 150 MG 24 hr capsule Take 150 mg by mouth daily.  0   No current facility-administered medications on file prior to visit.     BP 126/84 (BP Location: Right Arm, Patient Position: Sitting, Cuff Size: Small)   Pulse 81   Temp 98.6 F (37 C) (Oral)   Resp 16   Ht 5' 4.5" (1.638 m)   Wt 132 lb (59.9 kg)   SpO2 100%   BMI 22.31 kg/m       Objective:   Physical Exam  Constitutional: She appears well-developed and well-nourished.  HENT:  Head: Normocephalic and atraumatic.  Cardiovascular: Normal rate, regular rhythm and normal heart sounds.  No murmur heard. Pulmonary/Chest: Effort normal and breath sounds normal. No respiratory distress. She has no wheezes.  Neurological: She is alert.  Psychiatric: She has a normal mood and affect. Her behavior is normal. Judgment and thought content normal.          Assessment & Plan:

## 2017-10-01 ENCOUNTER — Telehealth: Payer: Self-pay | Admitting: Family

## 2017-10-01 DIAGNOSIS — E876 Hypokalemia: Secondary | ICD-10-CM

## 2017-10-01 MED ORDER — POTASSIUM CHLORIDE CRYS ER 20 MEQ PO TBCR: 20.0000 meq | EXTENDED_RELEASE_TABLET | Freq: Every day | ORAL | 5 refills | Status: DC

## 2017-10-01 NOTE — Assessment & Plan Note (Signed)
We discussed chantix.  She plans to set a quit date and fill chantix.

## 2017-10-01 NOTE — Assessment & Plan Note (Signed)
BP stable on amlodipine/hctz, continue same.

## 2017-10-01 NOTE — Telephone Encounter (Signed)
Left message on pt's voicemail to check mychart account. Message sent. Lab appt made for 10/08/17 and future lab order has been entered.

## 2017-10-01 NOTE — Telephone Encounter (Signed)
K+ is low. Take 2 tabs today then one tab once daily, repeat bmet in 1 week.

## 2017-10-01 NOTE — Assessment & Plan Note (Signed)
Stable on effexor, continue same. 

## 2017-10-02 ENCOUNTER — Other Ambulatory Visit: Payer: Self-pay | Admitting: Family

## 2017-10-02 DIAGNOSIS — Z0000 Encounter for general adult medical examination without abnormal findings: Secondary | ICD-10-CM | POA: Diagnosis not present

## 2017-10-02 DIAGNOSIS — Z01419 Encounter for gynecological examination (general) (routine) without abnormal findings: Secondary | ICD-10-CM | POA: Diagnosis not present

## 2017-10-02 DIAGNOSIS — Z720 Tobacco use: Secondary | ICD-10-CM | POA: Diagnosis not present

## 2017-10-02 DIAGNOSIS — Z3042 Encounter for surveillance of injectable contraceptive: Secondary | ICD-10-CM | POA: Diagnosis not present

## 2017-10-02 DIAGNOSIS — Z716 Tobacco abuse counseling: Secondary | ICD-10-CM | POA: Diagnosis not present

## 2017-10-02 NOTE — Telephone Encounter (Signed)
LV: 09/28/17 NV: 10/11/17 LR:  08/29/17

## 2017-10-04 NOTE — Telephone Encounter (Signed)
Patient has taken this medication with same dose one tablet daily in the past starting in 01/2017. It was discontinued in 12/18. Patient would like to know if the dose she picked up is the correct one? Please advise.

## 2017-10-08 ENCOUNTER — Other Ambulatory Visit: Payer: BLUE CROSS/BLUE SHIELD

## 2017-10-11 ENCOUNTER — Other Ambulatory Visit: Payer: BLUE CROSS/BLUE SHIELD

## 2017-10-12 NOTE — Telephone Encounter (Signed)
See 10/01/17 phone note.

## 2017-10-16 ENCOUNTER — Other Ambulatory Visit (INDEPENDENT_AMBULATORY_CARE_PROVIDER_SITE_OTHER): Payer: BLUE CROSS/BLUE SHIELD

## 2017-10-16 DIAGNOSIS — E876 Hypokalemia: Secondary | ICD-10-CM | POA: Diagnosis not present

## 2017-10-16 LAB — BASIC METABOLIC PANEL
BUN: 16 mg/dL (ref 6–23)
CO2: 26 mEq/L (ref 19–32)
Calcium: 8.9 mg/dL (ref 8.4–10.5)
Chloride: 105 mEq/L (ref 96–112)
Creatinine, Ser: 0.77 mg/dL (ref 0.40–1.20)
GFR: 84.42 mL/min (ref >60.00)
Glucose, Bld: 102 mg/dL — ABNORMAL HIGH (ref 70–99)
POTASSIUM: 4 meq/L (ref 3.5–5.1)
SODIUM: 139 meq/L (ref 135–145)

## 2017-10-25 ENCOUNTER — Encounter: Payer: Self-pay | Admitting: Family

## 2017-10-26 MED ORDER — AMLODIPINE BESYLATE 10 MG PO TABS: 10.0000 mg | ORAL_TABLET | Freq: Every day | ORAL | 1 refills | Status: DC

## 2017-10-26 MED ORDER — HYDROCHLOROTHIAZIDE 25 MG PO TABS: 25.0000 mg | ORAL_TABLET | Freq: Every day | ORAL | 1 refills | Status: DC

## 2017-12-04 ENCOUNTER — Encounter: Payer: Self-pay | Admitting: Family

## 2018-01-31 ENCOUNTER — Encounter: Payer: Self-pay | Admitting: Gastroenterology

## 2018-01-31 ENCOUNTER — Encounter: Payer: Self-pay | Admitting: Family

## 2018-01-31 DIAGNOSIS — E876 Hypokalemia: Secondary | ICD-10-CM

## 2018-01-31 MED ORDER — POTASSIUM CHLORIDE CRYS ER 20 MEQ PO TBCR: 30.0000 meq | EXTENDED_RELEASE_TABLET | Freq: Every day | ORAL | 5 refills | Status: DC

## 2018-02-06 ENCOUNTER — Other Ambulatory Visit: Payer: Self-pay

## 2018-02-06 ENCOUNTER — Ambulatory Visit (AMBULATORY_SURGERY_CENTER): Payer: Self-pay | Admitting: *Deleted

## 2018-02-06 VITALS — Ht 63.0 in | Wt 129.0 lb

## 2018-02-06 DIAGNOSIS — Z1211 Encounter for screening for malignant neoplasm of colon: Secondary | ICD-10-CM

## 2018-02-06 DIAGNOSIS — Z800 Family history of malignant neoplasm of digestive organs: Secondary | ICD-10-CM

## 2018-02-06 MED ORDER — CLENPIQ 10-3.5-12 MG-GM -GM/160ML PO SOLN: 1.0000 | Freq: Once | ORAL | 0 refills | Status: AC

## 2018-02-06 NOTE — Progress Notes (Signed)
Patient denies any allergies to eggs or soy. Patient denies any problems with anesthesia/sedation. Patient denies any oxygen use at home. Patient denies taking any diet/weight loss medications or blood thinners. EMMI education assisgned to patient on colonoscopy, this was explained and instructions given to patient. Clenpiq free coupon given to pt.

## 2018-02-12 ENCOUNTER — Encounter: Payer: Self-pay | Admitting: Family

## 2018-02-14 ENCOUNTER — Telehealth: Payer: Self-pay | Admitting: Gastroenterology

## 2018-02-14 NOTE — Telephone Encounter (Signed)
Patient had not yet gone to pharmacy, she had just called to inquire if medication was able to be picked up and was then informed by pharmacist it was not covered by her insurance. I informed her she would need to take the free coupon she received into the pharmacist as that is what gives them the instructions on how to process billing for the prescription. She states understanding.  She also inquired about starting prep earlier as she goes to bed early. I informed her she could start prep at 4pm, would not suggest any earlier and to make sure she stays on clear liquids.

## 2018-02-14 NOTE — Telephone Encounter (Signed)
Pt called to inform that her insurance does not cover clenpiq. She wants to know if she can have another prep. Also requested a call back with the status of this request. Thank you.

## 2018-02-18 ENCOUNTER — Ambulatory Visit: Payer: BLUE CROSS/BLUE SHIELD | Admitting: Family Medicine

## 2018-02-20 ENCOUNTER — Other Ambulatory Visit: Payer: Self-pay

## 2018-02-20 ENCOUNTER — Encounter: Payer: Self-pay | Admitting: Gastroenterology

## 2018-02-20 ENCOUNTER — Ambulatory Visit (AMBULATORY_SURGERY_CENTER): Payer: BLUE CROSS/BLUE SHIELD | Admitting: Gastroenterology

## 2018-02-20 VITALS — BP 124/83 | HR 76 | Temp 99.5°F | Resp 12 | Ht 64.0 in | Wt 132.0 lb

## 2018-02-20 DIAGNOSIS — K635 Polyp of colon: Secondary | ICD-10-CM

## 2018-02-20 DIAGNOSIS — D125 Benign neoplasm of sigmoid colon: Secondary | ICD-10-CM | POA: Diagnosis not present

## 2018-02-20 DIAGNOSIS — Z1211 Encounter for screening for malignant neoplasm of colon: Secondary | ICD-10-CM | POA: Diagnosis present

## 2018-02-20 MED ORDER — SODIUM CHLORIDE 0.9 % IV SOLN
500.0000 mL | Freq: Once | INTRAVENOUS | Status: DC
Start: 2018-02-20 — End: 2022-03-09

## 2018-02-20 NOTE — Progress Notes (Signed)
I have reviewed the patient's medical history in detail and updated the computerized patient record.

## 2018-02-20 NOTE — Progress Notes (Signed)
Report to PACU, RN, vss, BBS= Clear.  

## 2018-02-20 NOTE — Progress Notes (Signed)
Called to room to assist during endoscopic procedure.  Patient ID and intended procedure confirmed with present staff. Received instructions for my participation in the procedure from the performing physician.  

## 2018-02-20 NOTE — Op Note (Signed)
[facility] Patient Name: Alexis English Procedure Date: 02/20/2018 8:56 AM MRN: [id number] Endoscopist: Jackquline Denmark , MD Age: 52 Referring MD:  Date of Birth: 1966/06/28 Gender: Female Account #: [id number] Procedure:                Colonoscopy Indications:              Colon cancer screening in patient at increased                            risk: Colorectal cancer in father Stage III-                            undergoing treatment. Medicines:                Monitored Anesthesia Care Procedure:                Pre-Anesthesia Assessment:                           - Prior to the procedure, a History and Physical                            was performed, and patient medications and                            allergies were reviewed. The patient is competent.                            The risks and benefits of the procedure and the                            sedation options and risks were discussed with the                            patient. All questions were answered and informed                            consent was obtained. Patient identification and                            proposed procedure were verified by the physician                            in the procedure room. Mental Status Examination:                            alert and oriented. Prophylactic Antibiotics: The                            patient does not require prophylactic antibiotics.                            Prior Anticoagulants: The patient has taken no  previous anticoagulant or antiplatelet agents. ASA                            Grade Assessment: II - A patient with mild systemic                            disease. After reviewing the risks and benefits,                            the patient was deemed in satisfactory condition to                            undergo the procedure. The anesthesia plan was to                            use monitored anesthesia care  (MAC). Immediately                            prior to administration of medications, the patient                            was re-assessed for adequacy to receive sedatives.                            The heart rate, respiratory rate, oxygen                            saturations, blood pressure, adequacy of pulmonary                            ventilation, and response to care were monitored                            throughout the procedure. The physical status of                            the patient was re-assessed after the procedure.                           After obtaining informed consent, the colonoscope                            was passed under direct vision. Throughout the                            procedure, the patient's blood pressure, pulse, and                            oxygen saturations were monitored continuously. The                            Colonoscope was introduced through the anus and  advanced to the 2 cm into the ileum. The                            colonoscopy was performed without difficulty. The                            patient tolerated the procedure well. The quality                            of the bowel preparation was good. Scope In: 9:04:45 AM Scope Out: 9:21:15 AM Scope Withdrawal Time: 0 hours 13 minutes 4 seconds  Total Procedure Duration: 0 hours 16 minutes 30 seconds  Findings:                 A 6 mm polyp was found in the sigmoid colon. The                            polyp was sessile. The polyp was removed with a                            cold snare. Resection and retrieval were complete.                            Estimated blood loss: none.                           Small non-bleeding internal hemorrhoids were found                            during retroflexion. Complications:            No immediate complications. Estimated Blood Loss:     Estimated blood loss: none. Impression:               -  Colonic polyp status post polypectomy.                           - Family history of colon cancer. Recommendation:           - Patient has a contact number available for                            emergencies. The signs and symptoms of potential                            delayed complications were discussed with the                            patient. Return to normal activities tomorrow.                            Written discharge instructions were provided to the                            patient.                           -  Resume previous diet.                           - Continue present medications.                           - Await pathology results.                           - Repeat colonoscopy in 3 years for surveillance                            based on pathology results and due to family                            history of colon cancer in a first-degree relative.                           - Return to GI clinic PRN. Jackquline Denmark, MD 02/20/2018 9:28:59 AM This report has been signed electronically.

## 2018-02-20 NOTE — Patient Instructions (Signed)
YOU HAD AN ENDOSCOPIC PROCEDURE TODAY AT [facility]:   Refer to the procedure report that was given to you for any specific questions about what was found during the examination.  If the procedure report does not answer your questions, please call your gastroenterologist to clarify.  If you requested that your care partner not be given the details of your procedure findings, then the procedure report has been included in a sealed envelope for you to review at your convenience later.  YOU SHOULD EXPECT: Some feelings of bloating in the abdomen. Passage of more gas than usual.  Walking can help get rid of the air that was put into your GI tract during the procedure and reduce the bloating. If you had a lower endoscopy (such as a colonoscopy or flexible sigmoidoscopy) you may notice spotting of blood in your stool or on the toilet paper. If you underwent a bowel prep for your procedure, you may not have a normal bowel movement for a few days.  Please Note:  You might notice some irritation and congestion in your nose or some drainage.  This is from the oxygen used during your procedure.  There is no need for concern and it should clear up in a day or so.  SYMPTOMS TO REPORT IMMEDIATELY:   Following lower endoscopy (colonoscopy or flexible sigmoidoscopy):  Excessive amounts of blood in the stool  Significant tenderness or worsening of abdominal pains  Swelling of the abdomen that is new, acute  Fever of 100F or higher   For urgent or emergent issues, a gastroenterologist can be reached at any hour by calling [phone].   DIET:  We do recommend a small meal at first, but then you may proceed to your regular diet.  Drink plenty of fluids but you should avoid alcoholic beverages for 24 hours.  ACTIVITY:  You should plan to take it easy for the rest of today and you should NOT DRIVE or use heavy machinery until tomorrow (because of the sedation medicines used during the test).     FOLLOW UP: Our staff will call the number listed on your records the next business day following your procedure to check on you and address any questions or concerns that you may have regarding the information given to you following your procedure. If we do not reach you, we will leave a message.  However, if you are feeling well and you are not experiencing any problems, there is no need to return our call.  We will assume that you have returned to your regular daily activities without incident.  If any biopsies were taken you will be contacted by phone or by letter within the next 1-3 weeks.  Please call us at [phone] if you have not heard about the biopsies in 3 weeks.    SIGNATURES/CONFIDENTIALITY: You and/or your care partner have signed paperwork which will be entered into your electronic medical record.  These signatures attest to the fact that that the information above on your After Visit Summary has been reviewed and is understood.  Full responsibility of the confidentiality of this discharge information lies with you and/or your care-partner.   Handouts were given to your care partner on polyps and hemorrhoids. You may resume your current medications today. Await biopsy results. Repeat colonoscopy in 3 years for surveillance based on pathology results due to family history of colon cancer in a first-degree relative. Please call if any questions or concerns.

## 2018-02-20 NOTE — Progress Notes (Signed)
No problems noted in the recovery room. maw 

## 2018-02-21 ENCOUNTER — Telehealth: Payer: Self-pay | Admitting: *Deleted

## 2018-02-21 ENCOUNTER — Telehealth: Payer: Self-pay

## 2018-02-21 NOTE — Telephone Encounter (Signed)
  Follow up Call-  Call back number 02/20/2018  Post procedure Call Back phone  # [phone]  Permission to leave phone message Yes  Some recent data might be hidden     Patient questions:  Do you have a fever, pain , or abdominal swelling? No. Pain Score  0 *  Have you tolerated food without any problems? Yes.    Have you been able to return to your normal activities? Yes.    Do you have any questions about your discharge instructions: Diet   No. Medications  No. Follow up visit  No.  Do you have questions or concerns about your Care? No.  Actions: * If pain score is 4 or above: No action needed, pain <4.

## 2018-02-21 NOTE — Telephone Encounter (Signed)
Left message

## 2018-02-25 ENCOUNTER — Encounter: Payer: Self-pay | Admitting: Gastroenterology

## 2018-02-26 ENCOUNTER — Encounter: Payer: Self-pay | Admitting: Family

## 2018-02-26 ENCOUNTER — Ambulatory Visit: Payer: BLUE CROSS/BLUE SHIELD | Admitting: Family

## 2018-02-26 VITALS — BP 125/85 | HR 77 | Temp 98.8°F | Resp 16 | Ht 64.5 in | Wt 127.8 lb

## 2018-02-26 DIAGNOSIS — M5412 Radiculopathy, cervical region: Secondary | ICD-10-CM | POA: Diagnosis not present

## 2018-02-26 MED ORDER — CYCLOBENZAPRINE HCL 5 MG PO TABS: 5.0000 mg | ORAL_TABLET | Freq: Every day | ORAL | 0 refills | Status: DC

## 2018-02-26 MED ORDER — METHYLPREDNISOLONE 4 MG PO TBPK: ORAL_TABLET | ORAL | 0 refills | Status: DC

## 2018-02-26 NOTE — Patient Instructions (Signed)
Please begin medrol dose pak (steroids) for neck pain.  You may use flexeril (muscle relaxer) at night.  Call if symptoms worsen or if not improved in 1 week.

## 2018-02-26 NOTE — Progress Notes (Signed)
Subjective:    Patient ID: Alexis English, female    DOB: 09-03-1966, 52 y.o.   MRN: [id number]  HPI   Patient reports + neck pain.  Reports that for the last 1 month she has "pins and needles through her thumb and finger" taking advil without improvement. She denies associated weakness.  Had MRI in 2013 for same symptoms and this noted the following:  1.  C4-C5 ACDF with no adverse features. 2.  Adjacent segment disease at C5-C6 with spinal stenosis which mildly flattens the spinal cord.  No cord signal abnormality. Severe bilateral C6 foraminal stenosis. 3.  Mild adjacent segment disease at C3-C4 with borderline to mild spinal stenosis and mild right C4 foraminal stenosis. 4.  Mild disc degeneration at C6-C7 resulting in mild spinal stenosis and right C7 foraminal stenosis.  She was seen by neurosurgery at that time and tells me that surgery was recommended which she declined.   Review of Systems    see HPI  Past Medical History:  Diagnosis Date  . Anxiety   . Depression   . History of kidney stones   . Hypertension   . Pneumonia 08/2010  . Pulmonary nodule 08/2010   per CT  . Ruptured disc, cervical    C4-5; cervical fusion  . Smoker      Social History   Socioeconomic History  . Marital status: Divorced    Spouse name: Not on file  . Number of children: 2  . Years of education: Not on file  . Highest education level: Not on file  Occupational History  . 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 Financial resource strain: Not on file  . Food insecurity:    Worry: Not on file    Inability: Not on file  . Transportation needs:    Medical: Not on file    Non-medical: Not on file  Tobacco Use  . Smoking status: Current Every Day Smoker    Packs/day: 0.50    Years: 26.00    Pack years: 13.00    Types: Cigarettes  . Smokeless tobacco: Never Used  . Tobacco comment: trying to stop  smoking  Substance and Sexual Activity  . Alcohol use: Yes    Alcohol/week: 8.4 oz    Types: 14 Glasses of wine per week  . Drug use: Not Currently  . Sexual activity: Not on file  Lifestyle  . Physical activity:    Days per week: Not on file    Minutes per session: Not on file  . Stress: Not on file  Relationships  . Social connections:    Talks on phone: Not on file    Gets together: Not on file    Attends religious service: Not on file    Active member of club or organization: Not on file    Attends meetings of clubs or organizations: Not on file    Relationship status: Not on file  . Intimate partner violence:    Fear of current or ex partner: Not on file    Emotionally abused: Not on file    Physically abused: Not on file    Forced sexual activity: Not on file  Other Topics Concern  . Not on file  Social History Narrative   Regular exercise:  No       Past Surgical History:  Procedure Laterality Date  . ABDOMINAL HERNIA REPAIR  as a child  . CERVICAL FUSION  2000  . CESAREAN SECTION     x2  . CYSTECTOMY     from back x 2  . TONSILLECTOMY      Family History  Problem Relation Age of Onset  . Heart attack Mother   . Arthritis Mother   . Anxiety disorder Mother   . Heart attack Father   . Colon cancer Father 30       father having chemo now for colon cancer     Allergies  Allergen Reactions  . Hydrocodone Itching  . Bupropion Hcl     REACTION: hives  . Buspar [Buspirone] Other (See Comments)    Nausea, dizziness, weakness, muscle cramps  . Propoxyphene Hives  . Propoxyphene N-Acetaminophen     REACTION: hives    Current Outpatient Medications on File Prior to Visit  Medication Sig Dispense Refill  . amLODipine (NORVASC) 10 MG tablet Take 1 tablet (10 mg total) by mouth daily. 90 tablet 1  . hydrochlorothiazide (HYDRODIURIL) 25 MG tablet Take 1 tablet (25 mg total) by mouth daily. 90 tablet 1  . potassium chloride SA (KLOR-CON M20) 20 MEQ  tablet Take 1.5 tablets (30 mEq total) by mouth daily. 45 tablet 5  . venlafaxine XR (EFFEXOR-XR) 150 MG 24 hr capsule Take 150 mg by mouth daily.  0   Current Facility-Administered Medications on File Prior to Visit  Medication Dose Route Frequency Provider Last Rate Last Dose  . 0.9 %  sodium chloride infusion  500 mL Intravenous Once Jackquline Denmark, MD        BP 125/85 (BP Location: Right Arm, Patient Position: Sitting, Cuff Size: Small)   Pulse 77   Temp 98.8 F (37.1 C) (Oral)   Resp 16   Ht 5' 4.5" (1.638 m)   Wt 127 lb 12.8 oz (58 kg)   SpO2 100%   BMI 21.60 kg/m    Objective:   Physical Exam  Constitutional: She appears well-developed and well-nourished.  Cardiovascular: Normal rate, regular rhythm and normal heart sounds.  No murmur heard. Pulmonary/Chest: Effort normal and breath sounds normal. No respiratory distress. She has no wheezes.  Neurological: She is alert.  Reflex Scores:      Patellar reflexes are 2+ on the right side. Bilateral UE strength is 5/5 Diminished sensation to monofilament left thumb and forearm in C6 distribution   Psychiatric: She has a normal mood and affect. Her behavior is normal. Judgment and thought content normal.          Assessment & Plan:  C6 radiculopathy- recurrent, deteriorated. will rx with medrol dose pak and order MRI of the cspine to further evaluate. Perhaps she would be a candidate or ESI.

## 2018-03-14 ENCOUNTER — Other Ambulatory Visit: Payer: Self-pay | Admitting: Family

## 2018-03-14 DIAGNOSIS — E876 Hypokalemia: Secondary | ICD-10-CM

## 2018-04-02 ENCOUNTER — Encounter: Payer: Self-pay | Admitting: Family

## 2018-04-02 DIAGNOSIS — M5412 Radiculopathy, cervical region: Secondary | ICD-10-CM

## 2018-04-02 MED ORDER — MELOXICAM 7.5 MG PO TABS: 7.5000 mg | ORAL_TABLET | Freq: Every day | ORAL | 0 refills | Status: DC

## 2018-04-02 MED ORDER — CYCLOBENZAPRINE HCL 5 MG PO TABS: 5.0000 mg | ORAL_TABLET | Freq: Four times a day (QID) | ORAL | 0 refills | Status: DC | PRN

## 2018-04-02 NOTE — Telephone Encounter (Signed)
Debbrah Alar, NP  Sent: Tue April 02, 2018 9:37 AM  To: [name], [name], [name] to send meloxicam 7.5 mg po daily. #20. Flexeril 5 mg po every 6 hrs prn. #20. Recommend follow up with her neurosurgeon. Ok to place referral if she needs it.

## 2018-04-04 NOTE — Addendum Note (Signed)
Addended by: Debbrah Alar on: 04/04/2018 04:48 PM   Modules accepted: Orders

## 2018-04-08 ENCOUNTER — Telehealth: Payer: Self-pay | Admitting: Medical

## 2018-04-08 MED ORDER — GABAPENTIN 100 MG PO CAPS
100.0000 mg | ORAL_CAPSULE | Freq: Three times a day (TID) | ORAL | 3 refills | Status: DC
Start: 2018-04-08 — End: 2018-05-01

## 2018-04-08 NOTE — Telephone Encounter (Signed)
rx gabapentin sent to pt pharmacy. 

## 2018-04-08 NOTE — Telephone Encounter (Signed)
Alexis English - please advise in PCP's absence?

## 2018-04-27 ENCOUNTER — Other Ambulatory Visit: Payer: Self-pay | Admitting: Family

## 2018-05-01 ENCOUNTER — Other Ambulatory Visit: Payer: Self-pay | Admitting: Medical

## 2018-08-02 ENCOUNTER — Other Ambulatory Visit: Payer: Self-pay | Admitting: Family

## 2018-08-02 DIAGNOSIS — E876 Hypokalemia: Secondary | ICD-10-CM

## 2018-10-31 ENCOUNTER — Other Ambulatory Visit: Payer: Self-pay | Admitting: Family

## 2018-10-31 NOTE — Telephone Encounter (Signed)
Last seen 02-26-18 for cervical radiculopathy. No future appointments schedule. Please advise if ok to refill.

## 2018-11-01 NOTE — Telephone Encounter (Signed)
MyChart message sent to patient advising to call for f/up appointment.

## 2018-11-01 NOTE — Telephone Encounter (Signed)
30 day supply of meds sent,needs ov prior to additional refills.

## 2018-11-18 ENCOUNTER — Ambulatory Visit: Payer: BLUE CROSS/BLUE SHIELD | Admitting: Family

## 2018-11-18 ENCOUNTER — Encounter: Payer: Self-pay | Admitting: Family

## 2018-11-18 VITALS — BP 126/84 | HR 76 | Temp 98.6°F | Resp 16 | Ht 64.5 in | Wt 129.0 lb

## 2018-11-18 DIAGNOSIS — E876 Hypokalemia: Secondary | ICD-10-CM

## 2018-11-18 DIAGNOSIS — F32A Depression, unspecified: Secondary | ICD-10-CM

## 2018-11-18 DIAGNOSIS — I10 Essential (primary) hypertension: Secondary | ICD-10-CM

## 2018-11-18 DIAGNOSIS — F329 Major depressive disorder, single episode, unspecified: Secondary | ICD-10-CM

## 2018-11-18 DIAGNOSIS — M2550 Pain in unspecified joint: Secondary | ICD-10-CM

## 2018-11-18 MED ORDER — MELOXICAM 7.5 MG PO TABS: 7.5000 mg | ORAL_TABLET | Freq: Every day | ORAL | 0 refills | Status: DC

## 2018-11-18 MED ORDER — VENLAFAXINE HCL ER 75 MG PO CP24: 75.0000 mg | ORAL_CAPSULE | Freq: Every day | ORAL | 0 refills | Status: DC

## 2018-11-18 MED ORDER — AMLODIPINE BESYLATE 10 MG PO TABS: 10.0000 mg | ORAL_TABLET | Freq: Every day | ORAL | 1 refills | Status: DC

## 2018-11-18 MED ORDER — HYDROCHLOROTHIAZIDE 25 MG PO TABS: 25.0000 mg | ORAL_TABLET | Freq: Every day | ORAL | 1 refills | Status: DC

## 2018-11-18 MED ORDER — POTASSIUM CHLORIDE CRYS ER 20 MEQ PO TBCR: EXTENDED_RELEASE_TABLET | ORAL | 1 refills | Status: DC

## 2018-11-18 NOTE — Progress Notes (Signed)
Subjective:    Patient ID: Alexis English, female    DOB: 05/21/1966, 53 y.o.   MRN: [id number]  HPI  Patient presents today for follow up.    HTN- maintained on amlodipine 10mg  and hctz and kdur. BP Readings from Last 3 Encounters:  11/18/18 126/84  02/26/18 125/85  02/20/18 124/83   Depression- continues effexor. Reports that she feels well on effexor.  Was increased to 150 for hot flashes about a year ago.  Not helping her hot flashes.  She did not note any improvement in her mood with this adjustment.  She is interested in trying to return to the 75 mg daily dosing.  Reports bilateral elbow and knee  Pain. Has been present for a few weeks.  The last 2 days it seems to be better.  She denies joint swelling.  Hypertension-blood pressure stable on current medications.  Continue same.  Review of Systems See HPI  Past Medical History:  Diagnosis Date  . Anxiety   . Depression   . History of kidney stones   . Hypertension   . Pneumonia 08/2010  . Pulmonary nodule 08/2010   per CT  . Ruptured disc, cervical    C4-5; cervical fusion  . Smoker      Social History   Socioeconomic History  . Marital status: Divorced    Spouse name: Not on file  . Number of children: 2  . Years of education: Not on file  . Highest education level: Not on file  Occupational History  . 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 Financial resource strain: Not on file  . Food insecurity:    Worry: Not on file    Inability: Not on file  . Transportation needs:    Medical: Not on file    Non-medical: Not on file  Tobacco Use  . Smoking status: Current Every Day Smoker    Packs/day: 0.50    Years: 26.00    Pack years: 13.00    Types: Cigarettes  . Smokeless tobacco: Never Used  . Tobacco comment: trying to stop smoking  Substance and Sexual Activity  . Alcohol use: Yes    Alcohol/week: 14.0 standard drinks   Types: 14 Glasses of wine per week  . Drug use: Not Currently  . Sexual activity: Not on file  Lifestyle  . Physical activity:    Days per week: Not on file    Minutes per session: Not on file  . Stress: Not on file  Relationships  . Social connections:    Talks on phone: Not on file    Gets together: Not on file    Attends religious service: Not on file    Active member of club or organization: Not on file    Attends meetings of clubs or organizations: Not on file    Relationship status: Not on file  . Intimate partner violence:    Fear of current or ex partner: Not on file    Emotionally abused: Not on file    Physically abused: Not on file    Forced sexual activity: Not on file  Other Topics Concern  . Not on file  Social History Narrative   Regular exercise:  No       Past Surgical History:  Procedure Laterality Date  . ABDOMINAL HERNIA REPAIR     as a child  . CERVICAL FUSION  2000  .  CESAREAN SECTION     x2  . CYSTECTOMY     from back x 2  . TONSILLECTOMY      Family History  Problem Relation Age of Onset  . Heart attack Mother   . Arthritis Mother   . Anxiety disorder Mother   . Heart attack Father   . Colon cancer Father 24       father having chemo now for colon cancer     Allergies  Allergen Reactions  . Hydrocodone Itching  . Bupropion Hcl     REACTION: hives  . Buspar [Buspirone] Other (See Comments)    Nausea, dizziness, weakness, muscle cramps  . Propoxyphene Hives  . Propoxyphene N-Acetaminophen     REACTION: hives    Current Outpatient Medications on File Prior to Visit  Medication Sig Dispense Refill  . amLODipine (NORVASC) 10 MG tablet TAKE 1 TABLET BY MOUTH EVERY DAY 30 tablet 0  . gabapentin (NEURONTIN) 100 MG capsule PLEASE SEE ATTACHED FOR DETAILED DIRECTIONS 180 capsule 2  . hydrochlorothiazide (HYDRODIURIL) 25 MG tablet TAKE 1 TABLET BY MOUTH EVERY DAY 30 tablet 0  . KLOR-CON M20 20 MEQ tablet TAKE 1 & 1/2 TABLETS (30 MEQ  TOTAL) BY MOUTH DAILY. 135 tablet 1  . venlafaxine XR (EFFEXOR-XR) 150 MG 24 hr capsule Take 150 mg by mouth daily.  0   Current Facility-Administered Medications on File Prior to Visit  Medication Dose Route Frequency Provider Last Rate Last Dose  . 0.9 %  sodium chloride infusion  500 mL Intravenous Once Jackquline Denmark, MD        BP 126/84 (BP Location: Right Arm, Patient Position: Sitting, Cuff Size: Small)   Pulse 76   Temp 98.6 F (37 C) (Oral)   Resp 16   Ht 5' 4.5" (1.638 m)   Wt 129 lb (58.5 kg)   SpO2 100%   BMI 21.80 kg/m       Objective:   Physical Exam Constitutional:      Appearance: She is well-developed.  Neck:     Musculoskeletal: Neck supple.     Thyroid: No thyromegaly.  Cardiovascular:     Rate and Rhythm: Normal rate and regular rhythm.     Heart sounds: Normal heart sounds. No murmur.  Pulmonary:     Effort: Pulmonary effort is normal. No respiratory distress.     Breath sounds: Normal breath sounds. No wheezing.  Skin:    General: Skin is warm and dry.  Neurological:     Mental Status: She is alert and oriented to person, place, and time.  Psychiatric:        Behavior: Behavior normal.        Thought Content: Thought content normal.        Judgment: Judgment normal.           Assessment & Plan:   Hypertension-blood pressure stable on current medications.  Continue same.  Obtain follow-up basic metabolic panel  Joint pain-trial of meloxicam once daily for the next 2 weeks.  If symptoms worsen or fail to improve I have advised the patient to reach out to me and I will place orders to do autoimmune studies.  Depression-this is stable on Effexor.  Will decrease dose back to Effexor XR 75 mg once daily.  Patient is advised to let me know if she has worsening mood with decrease in dose.  Otherwise we will plan to see her back in 3 months for follow-up.

## 2018-12-07 ENCOUNTER — Other Ambulatory Visit: Payer: Self-pay | Admitting: Family

## 2018-12-09 NOTE — Telephone Encounter (Signed)
Looks like she never completed bmet last visit. Refill sent for hctz, but needs bmet prior to additional refills please to check her potassium levels.

## 2018-12-31 ENCOUNTER — Other Ambulatory Visit: Payer: Self-pay | Admitting: Family

## 2019-01-27 ENCOUNTER — Other Ambulatory Visit: Payer: Self-pay | Admitting: Family

## 2019-02-14 ENCOUNTER — Other Ambulatory Visit: Payer: Self-pay | Admitting: Family

## 2019-04-30 ENCOUNTER — Other Ambulatory Visit: Payer: Self-pay | Admitting: Family

## 2019-05-12 ENCOUNTER — Other Ambulatory Visit: Payer: Self-pay | Admitting: Family

## 2019-05-12 ENCOUNTER — Encounter: Payer: Self-pay | Admitting: Family

## 2019-05-12 MED ORDER — VENLAFAXINE HCL ER 37.5 MG PO CP24: ORAL_CAPSULE | ORAL | 0 refills | Status: DC

## 2019-05-14 ENCOUNTER — Other Ambulatory Visit: Payer: Self-pay | Admitting: Family

## 2019-05-28 ENCOUNTER — Other Ambulatory Visit: Payer: Self-pay | Admitting: Family

## 2019-06-06 ENCOUNTER — Encounter: Payer: Self-pay | Admitting: Family

## 2019-06-09 ENCOUNTER — Encounter: Payer: Self-pay | Admitting: Family

## 2019-06-09 MED ORDER — VENLAFAXINE HCL ER 75 MG PO CP24: 75.0000 mg | ORAL_CAPSULE | Freq: Every day | ORAL | 0 refills | Status: DC

## 2019-06-11 ENCOUNTER — Other Ambulatory Visit: Payer: Self-pay | Admitting: Family

## 2019-06-25 DIAGNOSIS — N9089 Other specified noninflammatory disorders of vulva and perineum: Secondary | ICD-10-CM | POA: Diagnosis not present

## 2019-06-25 DIAGNOSIS — Z113 Encounter for screening for infections with a predominantly sexual mode of transmission: Secondary | ICD-10-CM | POA: Diagnosis not present

## 2019-06-25 DIAGNOSIS — R300 Dysuria: Secondary | ICD-10-CM | POA: Diagnosis not present

## 2019-06-25 DIAGNOSIS — N898 Other specified noninflammatory disorders of vagina: Secondary | ICD-10-CM | POA: Diagnosis not present

## 2019-07-02 ENCOUNTER — Other Ambulatory Visit: Payer: Self-pay | Admitting: Family

## 2019-07-14 ENCOUNTER — Other Ambulatory Visit: Payer: Self-pay | Admitting: Family

## 2019-07-14 NOTE — Telephone Encounter (Signed)
2 week supply of amlodipine sent to pharmacy. Pt was due for a 3 month follow up in June and is past due. Sent [employer].

## 2019-07-22 ENCOUNTER — Ambulatory Visit: Payer: BC Managed Care – PPO | Admitting: Family

## 2019-07-22 ENCOUNTER — Encounter: Payer: Self-pay | Admitting: Family

## 2019-07-22 ENCOUNTER — Other Ambulatory Visit: Payer: Self-pay

## 2019-07-22 VITALS — BP 114/86 | HR 80 | Temp 98.0°F | Resp 16 | Ht 63.0 in | Wt 121.4 lb

## 2019-07-22 DIAGNOSIS — G4700 Insomnia, unspecified: Secondary | ICD-10-CM

## 2019-07-22 DIAGNOSIS — F418 Other specified anxiety disorders: Secondary | ICD-10-CM

## 2019-07-22 DIAGNOSIS — E876 Hypokalemia: Secondary | ICD-10-CM | POA: Diagnosis not present

## 2019-07-22 DIAGNOSIS — R519 Headache, unspecified: Secondary | ICD-10-CM | POA: Diagnosis not present

## 2019-07-22 DIAGNOSIS — Z23 Encounter for immunization: Secondary | ICD-10-CM

## 2019-07-22 DIAGNOSIS — I10 Essential (primary) hypertension: Secondary | ICD-10-CM

## 2019-07-22 LAB — COMPREHENSIVE METABOLIC PANEL
ALT: 14 U/L (ref 0–35)
AST: 18 U/L (ref 0–37)
Albumin: 4.7 g/dL (ref 3.5–5.2)
Alkaline Phosphatase: 38 U/L — ABNORMAL LOW (ref 39–117)
BUN: 11 mg/dL (ref 6–23)
CO2: 30 mEq/L (ref 19–32)
Calcium: 9.7 mg/dL (ref 8.4–10.5)
Chloride: 99 mEq/L (ref 96–112)
Creatinine, Ser: 0.63 mg/dL (ref 0.40–1.20)
GFR: 99.42 mL/min (ref >60.00)
Glucose, Bld: 87 mg/dL (ref 70–99)
Potassium: 3.7 mEq/L (ref 3.5–5.1)
Sodium: 136 mEq/L (ref 135–145)
Total Bilirubin: 0.6 mg/dL (ref 0.2–1.2)
Total Protein: 7 g/dL (ref 6.0–8.3)

## 2019-07-22 MED ORDER — VENLAFAXINE HCL ER 75 MG PO CP24: 75.0000 mg | ORAL_CAPSULE | Freq: Every day | ORAL | 1 refills | Status: DC

## 2019-07-22 MED ORDER — AMLODIPINE BESYLATE 10 MG PO TABS: 10.0000 mg | ORAL_TABLET | Freq: Every day | ORAL | 1 refills | Status: DC

## 2019-07-22 MED ORDER — POTASSIUM CHLORIDE CRYS ER 20 MEQ PO TBCR: EXTENDED_RELEASE_TABLET | ORAL | 1 refills | Status: DC

## 2019-07-22 MED ORDER — HYDROCHLOROTHIAZIDE 25 MG PO TABS: 25.0000 mg | ORAL_TABLET | Freq: Every day | ORAL | 1 refills | Status: DC

## 2019-07-22 NOTE — Progress Notes (Signed)
Subjective:    Patient ID: Alexis English, female    DOB: 1966-01-07, 53 y.o.   MRN: [id number]  HPI   Patient is a 53 yr old female who presents today for follow up.  HTN- maintained on hctz and amlodipine.  BP Readings from Last 3 Encounters:  07/22/19 114/86  11/18/18 126/84  02/26/18 125/85   Depression- tried to come off of effexor but did not feel well so went back on it.  Reports that her mood is good on effexor. No current issues wit hot flashes.   Reports that she is not sleeping well.  Also reports some "mild headaches."     Review of Systems See HPI  Past Medical History:  Diagnosis Date  . Anxiety   . Depression   . History of kidney stones   . Hypertension   . Pneumonia 08/2010  . Pulmonary nodule 08/2010   per CT  . Ruptured disc, cervical    C4-5; cervical fusion  . Smoker      Social History   Socioeconomic History  . Marital status: Divorced    Spouse name: Not on file  . Number of children: 2  . Years of education: Not on file  . Highest education level: Not on file  Occupational History  . 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 Financial resource strain: Not on file  . Food insecurity    Worry: Not on file    Inability: Not on file  . Transportation needs    Medical: Not on file    Non-medical: Not on file  Tobacco Use  . Smoking status: Current Every Day Smoker    Packs/day: 0.50    Years: 26.00    Pack years: 13.00    Types: Cigarettes  . Smokeless tobacco: Never Used  . Tobacco comment: trying to stop smoking  Substance and Sexual Activity  . Alcohol use: Yes    Alcohol/week: 14.0 standard drinks    Types: 14 Glasses of wine per week  . Drug use: Not Currently  . Sexual activity: Not on file  Lifestyle  . Physical activity    Days per week: Not on file    Minutes per session: Not on file  . Stress: Not on file  Relationships  . Social  Herbalist on phone: Not on file    Gets together: Not on file    Attends religious service: Not on file    Active member of club or organization: Not on file    Attends meetings of clubs or organizations: Not on file    Relationship status: Not on file  . Intimate partner violence    Fear of current or ex partner: Not on file    Emotionally abused: Not on file    Physically abused: Not on file    Forced sexual activity: Not on file  Other Topics Concern  . Not on file  Social History Narrative   Regular exercise:  No       Past Surgical History:  Procedure Laterality Date  . ABDOMINAL HERNIA REPAIR     as a child  . CERVICAL FUSION  2000  . CESAREAN SECTION     x2  . CYSTECTOMY     from back x 2  . TONSILLECTOMY      Family History  Problem Relation Age of Onset  . Heart attack  Mother   . Arthritis Mother   . Anxiety disorder Mother   . Heart attack Father   . Colon cancer Father 24       father having chemo now for colon cancer     Allergies  Allergen Reactions  . Hydrocodone Itching  . Bupropion Hcl     REACTION: hives  . Buspar [Buspirone] Other (See Comments)    Nausea, dizziness, weakness, muscle cramps  . Propoxyphene Hives  . Propoxyphene N-Acetaminophen     REACTION: hives    Current Outpatient Medications on File Prior to Visit  Medication Sig Dispense Refill  . amLODipine (NORVASC) 10 MG tablet TAKE 1 TABLET BY MOUTH EVERY DAY 15 tablet 0  . gabapentin (NEURONTIN) 100 MG capsule PLEASE SEE ATTACHED FOR DETAILED DIRECTIONS 180 capsule 2  . hydrochlorothiazide (HYDRODIURIL) 25 MG tablet TAKE 1 TABLET BY MOUTH EVERY DAY 30 tablet 1  . potassium chloride SA (KLOR-CON M20) 20 MEQ tablet TAKE 1 & 1/2 TABLETS (30 MEQ TOTAL) BY MOUTH DAILY. 135 tablet 1  . venlafaxine XR (EFFEXOR-XR) 75 MG 24 hr capsule Take 1 capsule (75 mg total) by mouth daily with breakfast. 90 capsule 0   Current Facility-Administered Medications on File Prior to Visit   Medication Dose Route Frequency Provider Last Rate Last Dose  . 0.9 %  sodium chloride infusion  500 mL Intravenous Once Jackquline Denmark, MD        BP 114/86 (BP Location: Right Arm, Patient Position: Sitting, Cuff Size: Small)   Pulse 80   Temp 98 F (36.7 C) (Temporal)   Resp 16   Ht 5\' 3"  (1.6 m)   Wt 121 lb 6.4 oz (55.1 kg)   SpO2 99%   BMI 21.51 kg/m       Objective:   Physical Exam Constitutional:      Appearance: She is well-developed.  Neck:     Musculoskeletal: Neck supple.     Thyroid: No thyromegaly.  Cardiovascular:     Rate and Rhythm: Normal rate and regular rhythm.     Heart sounds: Normal heart sounds. No murmur.  Pulmonary:     Effort: Pulmonary effort is normal. No respiratory distress.     Breath sounds: Normal breath sounds. No wheezing.  Skin:    General: Skin is warm and dry.  Neurological:     Mental Status: She is alert and oriented to person, place, and time.  Psychiatric:        Behavior: Behavior normal.        Thought Content: Thought content normal.        Judgment: Judgment normal.           Assessment & Plan:  HTN- bp is stable on current meds. Will obtain follow up CMET. Continue current meds.  Depression/anxiety- stable back on effexor. Continue same.  Insomnia- reports that she tried melatonin which helped but she woke up groggy. Previous dose dried was 10mg .  I recommended trial of melatonin 5mg  to see if this dose is better for her.  Headache- mild. She attributes to an OTC supplement she tried for sleep. I advised pt to monitor and to let me know if HA worsens or fails to improve after she stops the supplement.  Flu shot today.

## 2019-07-22 NOTE — Patient Instructions (Signed)
Please complete lab work prior to leaving.   

## 2019-08-07 ENCOUNTER — Encounter: Payer: Self-pay | Admitting: Family

## 2019-08-07 MED ORDER — CYCLOBENZAPRINE HCL 5 MG PO TABS: 5.0000 mg | ORAL_TABLET | Freq: Three times a day (TID) | ORAL | 0 refills | Status: DC | PRN

## 2020-02-04 ENCOUNTER — Other Ambulatory Visit: Payer: Self-pay | Admitting: Family

## 2020-02-04 DIAGNOSIS — E876 Hypokalemia: Secondary | ICD-10-CM

## 2020-03-09 ENCOUNTER — Encounter: Payer: Self-pay | Admitting: Family Medicine

## 2020-03-09 ENCOUNTER — Encounter: Payer: Self-pay | Admitting: Family

## 2020-03-09 ENCOUNTER — Other Ambulatory Visit: Payer: Self-pay | Admitting: Family

## 2020-03-09 DIAGNOSIS — E876 Hypokalemia: Secondary | ICD-10-CM

## 2020-03-09 MED ORDER — VENLAFAXINE HCL ER 75 MG PO CP24: 75.0000 mg | ORAL_CAPSULE | Freq: Every day | ORAL | 0 refills | Status: DC

## 2020-03-09 MED ORDER — POTASSIUM CHLORIDE CRYS ER 20 MEQ PO TBCR: EXTENDED_RELEASE_TABLET | ORAL | 0 refills | Status: DC

## 2020-03-09 MED ORDER — HYDROCHLOROTHIAZIDE 25 MG PO TABS: 25.0000 mg | ORAL_TABLET | Freq: Every day | ORAL | 0 refills | Status: DC

## 2020-03-09 MED ORDER — AMLODIPINE BESYLATE 10 MG PO TABS: 10.0000 mg | ORAL_TABLET | Freq: Every day | ORAL | 0 refills | Status: DC

## 2020-03-09 NOTE — Telephone Encounter (Signed)
Patient over due for appointment.  Tried to call but phone wasn't working, sent [employer] for patient to call to schedule and then we can send short supply to pharmacy.

## 2020-03-24 ENCOUNTER — Ambulatory Visit: Payer: BC Managed Care – PPO | Admitting: Family

## 2020-03-24 ENCOUNTER — Other Ambulatory Visit: Payer: Self-pay

## 2020-03-24 ENCOUNTER — Encounter: Payer: Self-pay | Admitting: Family

## 2020-03-24 VITALS — BP 114/82 | HR 75 | Temp 98.0°F | Resp 16 | Ht 63.0 in | Wt 125.0 lb

## 2020-03-24 DIAGNOSIS — F329 Major depressive disorder, single episode, unspecified: Secondary | ICD-10-CM

## 2020-03-24 DIAGNOSIS — I10 Essential (primary) hypertension: Secondary | ICD-10-CM | POA: Diagnosis not present

## 2020-03-24 DIAGNOSIS — Z0000 Encounter for general adult medical examination without abnormal findings: Secondary | ICD-10-CM

## 2020-03-24 DIAGNOSIS — G8929 Other chronic pain: Secondary | ICD-10-CM | POA: Diagnosis not present

## 2020-03-24 DIAGNOSIS — M542 Cervicalgia: Secondary | ICD-10-CM

## 2020-03-24 DIAGNOSIS — F32A Depression, unspecified: Secondary | ICD-10-CM

## 2020-03-24 LAB — BASIC METABOLIC PANEL
BUN: 10 mg/dL (ref 6–23)
CO2: 30 mEq/L (ref 19–32)
Calcium: 9.7 mg/dL (ref 8.4–10.5)
Chloride: 98 mEq/L (ref 96–112)
Creatinine, Ser: 0.67 mg/dL (ref 0.40–1.20)
GFR: 92.36 mL/min (ref >60.00)
Glucose, Bld: 88 mg/dL (ref 70–99)
Potassium: 4.2 mEq/L (ref 3.5–5.1)
Sodium: 137 mEq/L (ref 135–145)

## 2020-03-24 MED ORDER — AMLODIPINE BESYLATE 10 MG PO TABS: 10.0000 mg | ORAL_TABLET | Freq: Every day | ORAL | 1 refills | Status: DC

## 2020-03-24 MED ORDER — HYDROCHLOROTHIAZIDE 25 MG PO TABS: 25.0000 mg | ORAL_TABLET | Freq: Every day | ORAL | 1 refills | Status: DC

## 2020-03-24 MED ORDER — VENLAFAXINE HCL ER 75 MG PO CP24: 75.0000 mg | ORAL_CAPSULE | Freq: Every day | ORAL | 1 refills | Status: DC

## 2020-03-24 MED ORDER — BETAMETHASONE VALERATE 0.1 % EX OINT
1.0000 | TOPICAL_OINTMENT | Freq: Two times a day (BID) | CUTANEOUS | 0 refills | Status: DC
Start: 2020-03-24 — End: 2021-10-10

## 2020-03-24 NOTE — Patient Instructions (Addendum)
Please complete lab work prior to leaving.  You may use betamethasone cream twice daily as needed for the rash on your right forearm.

## 2020-03-24 NOTE — Progress Notes (Signed)
Subjective:    Patient ID: Alexis English, female    DOB: 1965/12/23, 54 y.o.   MRN: [id number]  HPI   Patient presents today for follow up.  HTN- maintained on amlodipine and hctz.  Reports good compliance with kdur.    BP Readings from Last 3 Encounters:  03/24/20 114/82  07/22/19 114/86  11/18/18 126/84   Depression- she is maintained on effexor.  She reports that mood remains well controlled.  She would like to continue Effexor at current dose.  gabpentin- uses as needed for neck issues.    Review of Systems See HPI  Past Medical History:  Diagnosis Date  . Anxiety   . Depression   . History of kidney stones   . Hypertension   . Pneumonia 08/2010  . Pulmonary nodule 08/2010   per CT  . Ruptured disc, cervical    C4-5; cervical fusion  . Smoker      Social History   Socioeconomic History  . Marital status: Divorced    Spouse name: Not on file  . Number of children: 2  . Years of education: Not on file  . Highest education level: Not on file  Occupational History  . 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 Smoking status: Current Every Day Smoker    Packs/day: 0.50    Years: 26.00    Pack years: 13.00    Types: Cigarettes  . Smokeless tobacco: Never Used  . Tobacco comment: trying to stop smoking  Vaping Use  . Vaping Use: Never used  Substance and Sexual Activity  . Alcohol use: Yes    Alcohol/week: 14.0 standard drinks    Types: 14 Glasses of wine per week  . Drug use: Not Currently  . Sexual activity: Not on file  Other Topics Concern  . Not on file  Social History Narrative   Regular exercise:  No   Owns a local wedding industry.     Social Determinants of Health   Financial Resource Strain:   . Difficulty of Paying Living Expenses:   Food Insecurity:   . Worried About Charity fundraiser in the Last Year:   . Arboriculturist in the Last Year:   Transportation  Needs:   . Film/video editor (Medical):   Marland Kitchen Lack of Transportation (Non-Medical):   Physical Activity:   . Days of Exercise per Week:   . Minutes of Exercise per Session:   Stress:   . Feeling of Stress :   Social Connections:   . Frequency of Communication with Friends and Family:   . Frequency of Social Gatherings with Friends and Family:   . Attends Religious Services:   . Active Member of Clubs or Organizations:   . Attends Archivist Meetings:   Marland Kitchen Marital Status:   Intimate Partner Violence:   . Fear of Current or Ex-Partner:   . Emotionally Abused:   Marland Kitchen Physically Abused:   . Sexually Abused:     Past Surgical History:  Procedure Laterality Date  . ABDOMINAL HERNIA REPAIR     as a child  . CERVICAL FUSION  2000  . CESAREAN SECTION     x2  . CYSTECTOMY     from back x 2  . TONSILLECTOMY      Family History  Problem Relation Age of Onset  . Heart attack Mother   . Arthritis Mother   .  Anxiety disorder Mother   . Heart attack Father   . Colon cancer Father 76       father having chemo now for colon cancer     Allergies  Allergen Reactions  . Hydrocodone Itching  . Bupropion Hcl     REACTION: hives  . Buspar [Buspirone] Other (See Comments)    Nausea, dizziness, weakness, muscle cramps  . Propoxyphene Hives  . Propoxyphene N-Acetaminophen     REACTION: hives    Current Outpatient Medications on File Prior to Visit  Medication Sig Dispense Refill  . cyclobenzaprine (FLEXERIL) 5 MG tablet Take 1 tablet (5 mg total) by mouth 3 (three) times daily as needed for muscle spasms. 20 tablet 0  . gabapentin (NEURONTIN) 100 MG capsule PLEASE SEE ATTACHED FOR DETAILED DIRECTIONS 180 capsule 2  . potassium chloride SA (KLOR-CON M20) 20 MEQ tablet TAKE 1 & 1/2 TABLETS (30 MEQ TOTAL) BY MOUTH DAILY. 45 tablet 0   Current Facility-Administered Medications on File Prior to Visit  Medication Dose Route Frequency Provider Last Rate Last Admin  . 0.9 %   sodium chloride infusion  500 mL Intravenous Once Jackquline Denmark, MD        BP 114/82 (BP Location: Right Arm, Patient Position: Sitting, Cuff Size: Small)   Pulse 75   Temp 98 F (36.7 C) (Temporal)   Resp 16   Ht 5\' 3"  (1.6 m)   Wt 125 lb (56.7 kg)   SpO2 100%   BMI 22.14 kg/m       Objective:   Physical Exam Constitutional:      Appearance: She is well-developed.  Neck:     Thyroid: No thyromegaly.  Cardiovascular:     Rate and Rhythm: Normal rate and regular rhythm.     Heart sounds: Normal heart sounds. No murmur heard.   Pulmonary:     Effort: Pulmonary effort is normal. No respiratory distress.     Breath sounds: Normal breath sounds. No wheezing.  Musculoskeletal:     Cervical back: Neck supple.  Skin:    General: Skin is warm and dry.  Neurological:     Mental Status: She is alert and oriented to person, place, and time.  Psychiatric:        Behavior: Behavior normal.        Thought Content: Thought content normal.        Judgment: Judgment normal.           Assessment & Plan:  Depression-stable on current dose of Effexor.  Continue same.  Hypertension-blood pressure is stable.  Continue current medications.  Will obtain follow-up basic metabolic panel to assess renal function and electrolytes.  Chronic neck pain-stable with as needed use of gabapentin.  This visit occurred during the SARS-CoV-2 public health emergency.  Safety protocols were in place, including screening questions prior to the visit, additional usage of staff PPE, and extensive cleaning of exam room while observing appropriate contact time as indicated for disinfecting solutions.

## 2020-04-06 ENCOUNTER — Other Ambulatory Visit: Payer: Self-pay | Admitting: Family

## 2020-04-06 DIAGNOSIS — E876 Hypokalemia: Secondary | ICD-10-CM

## 2020-04-09 DIAGNOSIS — Z20822 Contact with and (suspected) exposure to covid-19: Secondary | ICD-10-CM | POA: Diagnosis not present

## 2020-05-06 ENCOUNTER — Other Ambulatory Visit: Payer: Self-pay | Admitting: Family

## 2020-05-06 DIAGNOSIS — E876 Hypokalemia: Secondary | ICD-10-CM

## 2020-06-09 ENCOUNTER — Other Ambulatory Visit: Payer: Self-pay | Admitting: Family

## 2020-06-09 DIAGNOSIS — E876 Hypokalemia: Secondary | ICD-10-CM

## 2020-07-03 ENCOUNTER — Other Ambulatory Visit: Payer: Self-pay | Admitting: Family

## 2020-07-03 DIAGNOSIS — E876 Hypokalemia: Secondary | ICD-10-CM

## 2020-07-31 ENCOUNTER — Other Ambulatory Visit: Payer: Self-pay | Admitting: Family

## 2020-07-31 DIAGNOSIS — E876 Hypokalemia: Secondary | ICD-10-CM

## 2020-09-01 ENCOUNTER — Other Ambulatory Visit: Payer: Self-pay | Admitting: Family

## 2020-09-01 DIAGNOSIS — E876 Hypokalemia: Secondary | ICD-10-CM

## 2020-10-01 ENCOUNTER — Other Ambulatory Visit: Payer: Self-pay | Admitting: Family

## 2020-10-01 DIAGNOSIS — E876 Hypokalemia: Secondary | ICD-10-CM

## 2020-10-08 ENCOUNTER — Other Ambulatory Visit: Payer: Self-pay | Admitting: Family

## 2020-10-08 DIAGNOSIS — E876 Hypokalemia: Secondary | ICD-10-CM

## 2020-11-03 ENCOUNTER — Other Ambulatory Visit: Payer: Self-pay | Admitting: Family Medicine

## 2020-11-03 DIAGNOSIS — E876 Hypokalemia: Secondary | ICD-10-CM

## 2020-11-30 ENCOUNTER — Encounter: Payer: Self-pay | Admitting: Family

## 2020-11-30 DIAGNOSIS — L729 Follicular cyst of the skin and subcutaneous tissue, unspecified: Secondary | ICD-10-CM

## 2020-12-02 MED ORDER — DOXYCYCLINE HYCLATE 100 MG PO TABS: 100.0000 mg | ORAL_TABLET | Freq: Two times a day (BID) | ORAL | 0 refills | Status: DC

## 2020-12-02 NOTE — Addendum Note (Signed)
Addended by: Debbrah Alar on: 12/02/2020 08:11 AM   Modules accepted: Orders

## 2020-12-08 ENCOUNTER — Other Ambulatory Visit: Payer: Self-pay | Admitting: Family

## 2020-12-08 DIAGNOSIS — E876 Hypokalemia: Secondary | ICD-10-CM

## 2020-12-30 ENCOUNTER — Ambulatory Visit (INDEPENDENT_AMBULATORY_CARE_PROVIDER_SITE_OTHER): Payer: BC Managed Care – PPO | Admitting: Medical

## 2020-12-30 ENCOUNTER — Other Ambulatory Visit: Payer: Self-pay

## 2020-12-30 ENCOUNTER — Encounter: Payer: Self-pay | Admitting: Medical

## 2020-12-30 VITALS — BP 110/70 | HR 91 | Resp 18 | Ht 63.0 in | Wt 119.0 lb

## 2020-12-30 DIAGNOSIS — I10 Essential (primary) hypertension: Secondary | ICD-10-CM

## 2020-12-30 DIAGNOSIS — L723 Sebaceous cyst: Secondary | ICD-10-CM

## 2020-12-30 DIAGNOSIS — L089 Local infection of the skin and subcutaneous tissue, unspecified: Secondary | ICD-10-CM | POA: Diagnosis not present

## 2020-12-30 DIAGNOSIS — E876 Hypokalemia: Secondary | ICD-10-CM

## 2020-12-30 DIAGNOSIS — F418 Other specified anxiety disorders: Secondary | ICD-10-CM | POA: Diagnosis not present

## 2020-12-30 LAB — COMPREHENSIVE METABOLIC PANEL
ALT: 10 U/L (ref 0–35)
AST: 15 U/L (ref 0–37)
Albumin: 4.3 g/dL (ref 3.5–5.2)
Alkaline Phosphatase: 34 U/L — ABNORMAL LOW (ref 39–117)
BUN: 10 mg/dL (ref 6–23)
CO2: 30 mEq/L (ref 19–32)
Calcium: 9.8 mg/dL (ref 8.4–10.5)
Chloride: 99 mEq/L (ref 96–112)
Creatinine, Ser: 0.65 mg/dL (ref 0.40–1.20)
GFR: 100.84 mL/min (ref >60.00)
Glucose, Bld: 67 mg/dL — ABNORMAL LOW (ref 70–99)
Potassium: 4.2 mEq/L (ref 3.5–5.1)
Sodium: 139 mEq/L (ref 135–145)
Total Bilirubin: 0.5 mg/dL (ref 0.2–1.2)
Total Protein: 6.8 g/dL (ref 6.0–8.3)

## 2020-12-30 LAB — CBC WITH DIFFERENTIAL/PLATELET
Basophils Absolute: 0.1 10*3/uL (ref 0.0–0.1)
Basophils Relative: 0.9 % (ref 0.0–3.0)
Eosinophils Absolute: 0.1 10*3/uL (ref 0.0–0.7)
Eosinophils Relative: 1 % (ref 0.0–5.0)
HCT: 45.7 % (ref 36.0–46.0)
Hemoglobin: 15.5 g/dL — ABNORMAL HIGH (ref 12.0–15.0)
Lymphocytes Relative: 30.5 % (ref 12.0–46.0)
Lymphs Abs: 2.3 10*3/uL (ref 0.7–4.0)
MCHC: 33.9 g/dL (ref 30.0–36.0)
MCV: 98.5 fl (ref 78.0–100.0)
Monocytes Absolute: 0.7 10*3/uL (ref 0.1–1.0)
Monocytes Relative: 9.3 % (ref 3.0–12.0)
Neutro Abs: 4.4 10*3/uL (ref 1.4–7.7)
Neutrophils Relative %: 58.3 % (ref 43.0–77.0)
Platelets: 369 10*3/uL (ref 150.0–400.0)
RBC: 4.64 Mil/uL (ref 3.87–5.11)
RDW: 13.2 % (ref 11.5–15.5)
WBC: 7.5 10*3/uL (ref 4.0–10.5)

## 2020-12-30 LAB — LIPID PANEL
Cholesterol: 253 mg/dL — ABNORMAL HIGH (ref 0–200)
HDL: 86.4 mg/dL (ref >39.00)
LDL Cholesterol: 146 mg/dL — ABNORMAL HIGH (ref 0–99)
NonHDL: 166.67
Total CHOL/HDL Ratio: 3
Triglycerides: 103 mg/dL (ref 0.0–149.0)
VLDL: 20.6 mg/dL (ref 0.0–40.0)

## 2020-12-30 MED ORDER — AMLODIPINE BESYLATE 10 MG PO TABS: 1.0000 | ORAL_TABLET | Freq: Every day | ORAL | 0 refills | Status: DC

## 2020-12-30 MED ORDER — POTASSIUM CHLORIDE CRYS ER 20 MEQ PO TBCR: 30.0000 meq | EXTENDED_RELEASE_TABLET | Freq: Every day | ORAL | 0 refills | Status: DC

## 2020-12-30 MED ORDER — MUPIROCIN 2 % EX OINT: 1.0000 "application " | TOPICAL_OINTMENT | Freq: Two times a day (BID) | CUTANEOUS | 0 refills | Status: DC

## 2020-12-30 MED ORDER — SULFAMETHOXAZOLE-TRIMETHOPRIM 800-160 MG PO TABS: 1.0000 | ORAL_TABLET | Freq: Two times a day (BID) | ORAL | 0 refills | Status: DC

## 2020-12-30 MED ORDER — HYDROCHLOROTHIAZIDE 25 MG PO TABS: 1.0000 | ORAL_TABLET | Freq: Every day | ORAL | 1 refills | Status: DC

## 2020-12-30 MED ORDER — VENLAFAXINE HCL ER 75 MG PO CP24: 75.0000 mg | ORAL_CAPSULE | Freq: Every day | ORAL | 0 refills | Status: DC

## 2020-12-30 NOTE — Patient Instructions (Addendum)
History of sebaceous cyst procedures twice years ago.  Now recurrent irritation, itching, discomfort and possible discharge recently.  This is despite doxycycline antibiotic.  Today did get wound culture from surface of area.  That will be sent out.  During the interim we will prescribe Bactrim DS twice daily for 5 days.  Also prescribed topical mupirocin on to use twice daily.  Keep area covered if needed as we discussed but can leave open to air 3 to 4 hours in the evening as we discussed.  I did go ahead and place new referral to a different general surgeon office.  If area does not improve and if surgeon does not think procedure indicated then dermatology referral might be indicated.  History of hypertension and blood pressure tightly controlled.  Continue current medication regimen.  Would recommend checking blood pressure every other day and giving [place] update to see what blood pressures are.  If persistent low then consistently then might need dose reduction.  Hx of depession and anxiety. Continue effexor.  Follow-up in 7 days with PCP or as needed.

## 2020-12-30 NOTE — Progress Notes (Signed)
Subjective:    Patient ID: Alexis English, female    DOB: 04/27/1966, 55 y.o.   MRN: [id number]  HPI  Pt in with some left trapezius are irritation. She has history of sebacious cyst procedure. Pt states that cyst was removed. Pt describes complications after procedure. She states these procedures were done in 2007. Pt states had severe complications after 2nd procedure.  Pt states had 2 surgeries done in past with 2 different.  Pt was picking at area last week. She states pulled out creamy whitish appearance. She is not sure what it was.  Pt was given appointment by her pcp with [facility]. Pt wants to be referred to other surgeon. [facility] has called pt but she has not returned call.  Pt has gotten doxycycline. She took full 7 days.  Pt has hx of htn. She is on amlodipine 10 mg daily and hctz 25 mg daily.  Pt has depression and anxiety. On effexor 37.5 mg daily.   Review of Systems  Constitutional: Negative for chills, fatigue and fever.  Respiratory: Negative for cough, choking, shortness of breath and wheezing.   Cardiovascular: Negative for chest pain and palpitations.  Gastrointestinal: Positive for diarrhea. Negative for abdominal pain, constipation and rectal pain.  Genitourinary: Negative for dysuria, frequency and urgency.  Musculoskeletal: Negative for back pain.  Skin: Negative for rash.  Neurological: Negative for seizures, facial asymmetry, weakness and light-headedness.  Hematological: Negative for adenopathy. Does not bruise/bleed easily.  Psychiatric/Behavioral: Negative for confusion and sleep disturbance. The patient is not nervous/anxious.     Past Medical History:  Diagnosis Date  . Anxiety   . Depression   . History of kidney stones   . Hypertension   . Pneumonia 08/2010  . Pulmonary nodule 08/2010   per CT  . Ruptured disc, cervical    C4-5; cervical fusion  . Smoker      Social History   Socioeconomic History  . Marital  status: Divorced    Spouse name: Not on file  . Number of children: 2  . Years of education: Not on file  . Highest education level: Not on file  Occupational History  . 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 Smoking status: Current Every Day Smoker    Packs/day: 0.50    Years: 26.00    Pack years: 13.00    Types: Cigarettes  . Smokeless tobacco: Never Used  . Tobacco comment: trying to stop smoking  Vaping Use  . Vaping Use: Never used  Substance and Sexual Activity  . Alcohol use: Yes    Alcohol/week: 14.0 standard drinks    Types: 14 Glasses of wine per week  . Drug use: Not Currently  . Sexual activity: Not on file  Other Topics Concern  . Not on file  Social History Narrative   Regular exercise:  No   Owns a local wedding industry.     Social Determinants of Health   Financial Resource Strain: Not on file  Food Insecurity: Not on file  Transportation Needs: Not on file  Physical Activity: Not on file  Stress: Not on file  Social Connections: Not on file  Intimate Partner Violence: Not on file    Past Surgical History:  Procedure Laterality Date  . ABDOMINAL HERNIA REPAIR     as a child  . CERVICAL FUSION  2000  . CESAREAN SECTION  x2  . CYSTECTOMY     from back x 2  . TONSILLECTOMY      Family History  Problem Relation Age of Onset  . Heart attack Mother   . Arthritis Mother   . Anxiety disorder Mother   . Heart attack Father   . Colon cancer Father 80       father having chemo now for colon cancer     Allergies  Allergen Reactions  . Hydrocodone Itching  . Bupropion Hcl     REACTION: hives  . Buspar [Buspirone] Other (See Comments)    Nausea, dizziness, weakness, muscle cramps  . Propoxyphene Hives  . Propoxyphene N-Acetaminophen     REACTION: hives    Current Outpatient Medications on File Prior to Visit  Medication Sig Dispense Refill  . amLODipine (NORVASC)  10 MG tablet TAKE 1 TABLET BY MOUTH EVERY DAY 90 tablet 0  . betamethasone valerate ointment (VALISONE) 0.1 % Apply 1 application topically 2 (two) times daily. 30 g 0  . cyclobenzaprine (FLEXERIL) 5 MG tablet Take 1 tablet (5 mg total) by mouth 3 (three) times daily as needed for muscle spasms. 20 tablet 0  . doxycycline (VIBRA-TABS) 100 MG tablet Take 1 tablet (100 mg total) by mouth 2 (two) times daily. 14 tablet 0  . gabapentin (NEURONTIN) 100 MG capsule PLEASE SEE ATTACHED FOR DETAILED DIRECTIONS 180 capsule 2  . hydrochlorothiazide (HYDRODIURIL) 25 MG tablet TAKE 1 TABLET BY MOUTH EVERY DAY 90 tablet 1  . KLOR-CON M20 20 MEQ tablet TAKE 1 AND 1/2 TABLETS BY MOUTH DAILY 45 tablet 0  . venlafaxine XR (EFFEXOR-XR) 75 MG 24 hr capsule TAKE 1 CAPSULE (75 MG TOTAL) BY MOUTH DAILY WITH BREAKFAST. 90 capsule 0   Current Facility-Administered Medications on File Prior to Visit  Medication Dose Route Frequency Provider Last Rate Last Admin  . 0.9 %  sodium chloride infusion  500 mL Intravenous Once Jackquline Denmark, MD        BP 104/89   Pulse 91   Resp 18   Ht 5\' 3"  (1.6 m)   Wt 119 lb (54 kg)   SpO2 99%   BMI 21.08 kg/m        Objective:   Physical Exam  General Mental Status- Alert. General Appearance- Not in acute distress.   Skin General: Color- Normal Color. Moisture- Normal Moisture.  Neck Carotid Arteries- Normal color. Moisture- Normal Moisture. No carotid bruits. No JVD.  Chest and Lung Exam Auscultation: Breath Sounds:-Normal.  Cardiovascular Auscultation:Rythm- Regular. Murmurs & Other Heart Sounds:Auscultation of the heart reveals- No Murmurs.   Neurologic Cranial Nerve exam:- CN III-XII intact(No nystagmus), symmetric smile. Strength:- 5/5 equal and symmetric strength both upper and lower extremities.  Back- upper border of left scapula area of shallow depresses skin with appears of old scar from prior 2 surgeries.No active yellow dc. Some pinkish redness at  edge of prior wound/scar. No warmth. No obvious induration. Area of old scar about 2 cm wide.     Assessment & Plan:  History of sebaceous cyst procedures twice years ago.  Now recurrent irritation, itching, discomfort and possible discharge recently.  This is despite doxycycline antibiotic.  Today did get wound culture from surface of area.  That will be sent out.  During the interim we will prescribe Bactrim DS twice daily for 5 days.  Also prescribed topical mupirocin on to use twice daily.  Keep area covered if needed as we discussed but can leave open to air  3 to 4 hours in the evening as we discussed.  I did go ahead and place new referral to a different general surgeon office.  If area does not improve and if surgeon does not think procedure indicated then dermatology referral might be indicated.  History of hypertension and blood pressure tightly controlled.  Continue current medication regimen.  Would recommend checking blood pressure every other day and giving [place] update to see what blood pressures are.  If persistent low then consistently then might need dose reduction.  History of sebaceous cyst procedures twice years ago.  Now recurrent irritation, itching, discomfort and possible discharge recently.  This is despite doxycycline antibiotic.  Today did get wound culture from surface of area.  That will be sent out.  During the interim we will prescribe Bactrim DS twice daily for 5 days.  Also prescribed topical mupirocin on to use twice daily.  Keep area covered if needed as we discussed but can leave open to air 3 to 4 hours in the evening as we discussed.  I did go ahead and place new referral to a different general surgeon office.  If area does not improve and if surgeon does not think procedure indicated then dermatology referral might be indicated.  History of hypertension and blood pressure tightly controlled.  Continue current medication regimen.  Would recommend checking blood  pressure every other day and giving [place] update to see what blood pressures are.  If persistent low then consistently then might need dose reduction.  Hx of depression and anxiety. Continue effexor.  Follow-up in 7 days with PCP or as needed.  Time spent with patient today was 46  minutes which consisted of chart review, discussing diagnoses, work up, treatment and documentation.

## 2021-01-03 ENCOUNTER — Other Ambulatory Visit: Payer: Self-pay | Admitting: Family

## 2021-01-03 DIAGNOSIS — E876 Hypokalemia: Secondary | ICD-10-CM

## 2021-01-03 LAB — WOUND CULTURE
MICRO NUMBER:: 11770936
RESULT:: NO GROWTH
SPECIMEN QUALITY:: ADEQUATE

## 2021-03-18 ENCOUNTER — Encounter: Payer: Self-pay | Admitting: Family

## 2021-03-18 ENCOUNTER — Other Ambulatory Visit: Payer: Self-pay

## 2021-03-18 MED ORDER — GABAPENTIN 100 MG PO CAPS: ORAL_CAPSULE | ORAL | 0 refills | Status: DC

## 2021-03-18 NOTE — Telephone Encounter (Signed)
Patient requesting refill of Gabapentin for neck "flare up". Last rx in record from 2019 by Percell Miller. Patient says she has been taking as needed and pcp is aware of neck problems.

## 2021-04-06 ENCOUNTER — Other Ambulatory Visit: Payer: Self-pay | Admitting: Medical

## 2021-04-12 ENCOUNTER — Other Ambulatory Visit: Payer: Self-pay | Admitting: Medical

## 2021-06-06 ENCOUNTER — Encounter: Payer: Self-pay | Admitting: Gastroenterology

## 2021-07-07 ENCOUNTER — Other Ambulatory Visit: Payer: Self-pay | Admitting: Family

## 2021-07-07 DIAGNOSIS — E876 Hypokalemia: Secondary | ICD-10-CM

## 2021-07-08 ENCOUNTER — Other Ambulatory Visit: Payer: Self-pay | Admitting: Medical

## 2021-07-11 ENCOUNTER — Other Ambulatory Visit: Payer: Self-pay | Admitting: Medical

## 2021-09-29 ENCOUNTER — Other Ambulatory Visit: Payer: Self-pay | Admitting: Family

## 2021-09-29 DIAGNOSIS — E876 Hypokalemia: Secondary | ICD-10-CM

## 2021-10-05 ENCOUNTER — Other Ambulatory Visit: Payer: Self-pay | Admitting: Family

## 2021-10-05 ENCOUNTER — Other Ambulatory Visit: Payer: Self-pay | Admitting: Medical

## 2021-10-05 DIAGNOSIS — E876 Hypokalemia: Secondary | ICD-10-CM

## 2021-10-06 ENCOUNTER — Encounter: Payer: Self-pay | Admitting: Family

## 2021-10-10 ENCOUNTER — Ambulatory Visit: Payer: BC Managed Care – PPO | Admitting: Family

## 2021-10-10 VITALS — BP 121/80 | HR 78 | Temp 98.6°F | Resp 16 | Wt 120.0 lb

## 2021-10-10 DIAGNOSIS — Z720 Tobacco use: Secondary | ICD-10-CM

## 2021-10-10 DIAGNOSIS — F418 Other specified anxiety disorders: Secondary | ICD-10-CM

## 2021-10-10 DIAGNOSIS — I10 Essential (primary) hypertension: Secondary | ICD-10-CM

## 2021-10-10 DIAGNOSIS — Z0000 Encounter for general adult medical examination without abnormal findings: Secondary | ICD-10-CM

## 2021-10-10 DIAGNOSIS — M542 Cervicalgia: Secondary | ICD-10-CM | POA: Diagnosis not present

## 2021-10-10 DIAGNOSIS — E876 Hypokalemia: Secondary | ICD-10-CM | POA: Diagnosis not present

## 2021-10-10 LAB — BASIC METABOLIC PANEL
BUN: 13 mg/dL (ref 6–23)
CO2: 28 mEq/L (ref 19–32)
Calcium: 9.7 mg/dL (ref 8.4–10.5)
Chloride: 102 mEq/L (ref 96–112)
Creatinine, Ser: 0.63 mg/dL (ref 0.40–1.20)
GFR: 101.05 mL/min (ref >60.00)
Glucose, Bld: 94 mg/dL (ref 70–99)
Potassium: 4 mEq/L (ref 3.5–5.1)
Sodium: 138 mEq/L (ref 135–145)

## 2021-10-10 MED ORDER — VARENICLINE TARTRATE 0.5 MG X 11 & 1 MG X 42 PO TBPK: ORAL_TABLET | ORAL | 0 refills | Status: DC

## 2021-10-10 MED ORDER — POTASSIUM CHLORIDE CRYS ER 20 MEQ PO TBCR: EXTENDED_RELEASE_TABLET | ORAL | 1 refills | Status: DC

## 2021-10-10 NOTE — Assessment & Plan Note (Signed)
Stable on effexor xr. Continue same.

## 2021-10-10 NOTE — Assessment & Plan Note (Addendum)
Uncontrolled. Trial of chantix. Common side effects including rare risk of suicide ideation was discussed with the patient today.  Patient is instructed to go directly to the ED if this occurs.  We discussed that patient can continue to smoke for 1 week after starting chantix, but then must discontinue cigarettes.  She is also instructed to contact us prior to completion of the starter month pack for an rx for the continuation month pack.  5 minutes spent with patient today on tobacco cessation counseling.

## 2021-10-10 NOTE — Assessment & Plan Note (Signed)
Currently stable. Will continue to monitor.

## 2021-10-10 NOTE — Assessment & Plan Note (Signed)
BP stable. Continue amlodipine and hctz.

## 2021-10-10 NOTE — Progress Notes (Signed)
Subjective:     Patient ID: Alexis English, female    DOB: 1966-08-16, 56 y.o.   MRN: [id number]  Chief Complaint  Patient presents with   Hypertension    Here for follow up   Depression    Here for follow up    Hypertension  Depression       Patient is in today for follow up.  HTN- maintained on amlodipine 10mg  and hctz 25mg .  BP Readings from Last 3 Encounters:  10/10/21 121/80  12/30/20 110/70  03/24/20 114/82   Depression/anxiety- reports that this has been stable.    Tobacco abuse-  Still smoking 1/2 PPD.  Thinking about quitting but not sure if she is ready yet.   Wt Readings from Last 3 Encounters:  10/10/21 120 lb (54.4 kg)  12/30/20 119 lb (54 kg)  03/24/20 125 lb (56.7 kg)     Health Maintenance Due  Topic Date Due   HIV Screening  Never done   Hepatitis C Screening  Never done   MAMMOGRAM  02/20/2017   Zoster Vaccines- Shingrix (1 of 2) Never done   PAP SMEAR-Modifier  07/19/2018   COVID-19 Vaccine (3 - Booster for Moderna series) 07/11/2020   COLONOSCOPY (Pts 45-38yrs Insurance coverage will need to be confirmed)  02/20/2021   INFLUENZA VACCINE  04/18/2021    Past Medical History:  Diagnosis Date   Anxiety    Depression    History of kidney stones    Hypertension    Pneumonia 08/2010   Pulmonary nodule 08/2010   per CT   Ruptured disc, cervical    C4-5; cervical fusion   Smoker     Past Surgical History:  Procedure Laterality Date   ABDOMINAL HERNIA REPAIR     as a child   CERVICAL FUSION  2000   CESAREAN SECTION     x2   CYSTECTOMY     from back x 2   TONSILLECTOMY      Family History  Problem Relation Age of Onset   Heart attack Mother    Arthritis Mother    Anxiety disorder Mother    Heart attack Father    Colon cancer Father 56       father having chemo now for colon cancer     Social History   Socioeconomic History   Marital status: Divorced    Spouse name: Not on file   Number of children: 2   Years of  education: Not on file   Highest education level: Not on file  Occupational History   Occupation: OWNER    Employer: [employer]    Comment: wedding Academic librarian  Tobacco Use   Smoking status: Every Day    Packs/day: 0.50    Years: 26.00    Pack years: 13.00    Types: Cigarettes   Smokeless tobacco: Never   Tobacco comments:    trying to stop smoking  Vaping Use   Vaping Use: Never used  Substance and Sexual Activity   Alcohol use: Yes    Alcohol/week: 14.0 standard drinks    Types: 14 Glasses of wine per week   Drug use: Not Currently   Sexual activity: Not on file  Other Topics Concern   Not on file  Social History Narrative   Regular exercise:  No   Owns a local wedding industry.     Social Determinants of Health   Financial Resource Strain: Not on file  Food Insecurity: Not on  file  Transportation Needs: Not on file  Physical Activity: Not on file  Stress: Not on file  Social Connections: Not on file  Intimate Partner Violence: Not on file    Outpatient Medications Prior to Visit  Medication Sig Dispense Refill   amLODipine (NORVASC) 10 MG tablet TAKE 1 TABLET BY MOUTH EVERY DAY 90 tablet 0   gabapentin (NEURONTIN) 100 MG capsule 1 cap PO TID prn neck pain 45 capsule 0   hydrochlorothiazide (HYDRODIURIL) 25 MG tablet TAKE 1 TABLET (25 MG TOTAL) BY MOUTH DAILY. 90 tablet 1   venlafaxine XR (EFFEXOR-XR) 75 MG 24 hr capsule TAKE 1 CAPSULE BY MOUTH DAILY WITH BREAKFAST. 90 capsule 0   KLOR-CON M20 20 MEQ tablet TAKE 1 AND 1/2 TABLETS (30 MEQ TOTAL) BY MOUTH DAILY. 135 tablet 0   betamethasone valerate ointment (VALISONE) 0.1 % Apply 1 application topically 2 (two) times daily. 30 g 0   cyclobenzaprine (FLEXERIL) 5 MG tablet Take 1 tablet (5 mg total) by mouth 3 (three) times daily as needed for muscle spasms. 20 tablet 0   mupirocin ointment (BACTROBAN) 2 % Apply 1 application topically 2 (two) times daily. 22 g 0   sulfamethoxazole-trimethoprim  (BACTRIM DS) 800-160 MG tablet Take 1 tablet by mouth 2 (two) times daily. 10 tablet 0   Facility-Administered Medications Prior to Visit  Medication Dose Route Frequency Provider Last Rate Last Admin   0.9 %  sodium chloride infusion  500 mL Intravenous Once Jackquline Denmark, MD        Allergies  Allergen Reactions   Hydrocodone Itching   Bupropion Hcl     REACTION: hives   Buspar [Buspirone] Other (See Comments)    Nausea, dizziness, weakness, muscle cramps   Propoxyphene Hives   Propoxyphene N-Acetaminophen     REACTION: hives   Zoloft [Sertraline]     hives    Review of Systems  Psychiatric/Behavioral:  Positive for depression.   See HPI    Objective:    Physical Exam Constitutional:      General: She is not in acute distress.    Appearance: Normal appearance. She is well-developed.  HENT:     Head: Normocephalic and atraumatic.     Right Ear: External ear normal.     Left Ear: External ear normal.  Eyes:     General: No scleral icterus. Neck:     Thyroid: No thyromegaly.  Cardiovascular:     Rate and Rhythm: Normal rate and regular rhythm.     Heart sounds: Normal heart sounds. No murmur heard. Pulmonary:     Effort: Pulmonary effort is normal. No respiratory distress.     Breath sounds: No wheezing.     Comments: Diminished breath sounds to bases Musculoskeletal:     Cervical back: Neck supple.  Skin:    General: Skin is warm and dry.  Neurological:     Mental Status: She is alert and oriented to person, place, and time.  Psychiatric:        Mood and Affect: Mood normal.        Behavior: Behavior normal.        Thought Content: Thought content normal.        Judgment: Judgment normal.    BP 121/80 (BP Location: Right Arm, Patient Position: Sitting, Cuff Size: Small)    Pulse 78    Temp 98.6 F (37 C) (Oral)    Resp 16    Wt 120 lb (54.4 kg)    SpO2  100%    BMI 21.26 kg/m  Wt Readings from Last 3 Encounters:  10/10/21 120 lb (54.4 kg)  12/30/20 119 lb  (54 kg)  03/24/20 125 lb (56.7 kg)       Assessment & Plan:   Problem List Items Addressed This Visit       Unprioritized   Tobacco abuse    Uncontrolled. Trial of chantix. Common side effects including rare risk of suicide ideation was discussed with the patient today.  Patient is instructed to go directly to the ED if this occurs.  We discussed that patient can continue to smoke for 1 week after starting chantix, but then must discontinue cigarettes.  She is also instructed to contact us prior to completion of the starter month pack for an rx for the continuation month pack.  5 minutes spent with patient today on tobacco cessation counseling.        Preventative health care - Primary   Relevant Orders   MM 3D SCREEN BREAST BILATERAL   NECK PAIN, CHRONIC    Currently stable. Will continue to monitor.      HTN (hypertension)    BP stable. Continue amlodipine and hctz.       Depression with anxiety    Stable on effexor xr. Continue same.       Other Visit Diagnoses     Hypokalemia       Relevant Medications   potassium chloride SA (KLOR-CON M20) 20 MEQ tablet   Other Relevant Orders   Basic metabolic panel       I have discontinued Shawnay Bramel. Grassia's cyclobenzaprine, betamethasone valerate ointment, mupirocin ointment, and sulfamethoxazole-trimethoprim. I have also changed her Klor-Con M20 to potassium chloride SA. Additionally, I am having her start on varenicline. Lastly, I am having her maintain her gabapentin, hydrochlorothiazide, amLODipine, and venlafaxine XR. We will continue to administer sodium chloride.  Meds ordered this encounter  Medications   potassium chloride SA (KLOR-CON M20) 20 MEQ tablet    Sig: TAKE 1 AND 1/2 TABLETS (30 MEQ TOTAL) BY MOUTH DAILY.    Dispense:  135 tablet    Refill:  1   varenicline (CHANTIX PAK) 0.5 MG X 11 & 1 MG X 42 tablet    Sig: Take one 0.5 mg tablet by mouth once daily for 3 days, then increase to one 0.5 mg tablet  twice daily for 4 days, then increase to one 1 mg tablet twice daily.    Dispense:  53 tablet    Refill:  0    Order Specific Question:   Supervising Provider    Answer:   Penni Homans A [[id number]]

## 2021-10-11 ENCOUNTER — Telehealth (HOSPITAL_BASED_OUTPATIENT_CLINIC_OR_DEPARTMENT_OTHER): Payer: Self-pay

## 2021-10-16 ENCOUNTER — Encounter: Payer: Self-pay | Admitting: Family

## 2021-10-21 ENCOUNTER — Encounter: Payer: Self-pay | Admitting: Family

## 2021-10-31 ENCOUNTER — Other Ambulatory Visit: Payer: Self-pay

## 2021-10-31 ENCOUNTER — Encounter (HOSPITAL_BASED_OUTPATIENT_CLINIC_OR_DEPARTMENT_OTHER): Payer: Self-pay

## 2021-10-31 ENCOUNTER — Ambulatory Visit (HOSPITAL_BASED_OUTPATIENT_CLINIC_OR_DEPARTMENT_OTHER)
Admission: RE | Admit: 2021-10-31 | Discharge: 2021-10-31 | Disposition: A | Discharge status: Home / Self Care | Payer: BC Managed Care – PPO | Source: Ambulatory Visit | Attending: Family | Admitting: Family

## 2021-10-31 DIAGNOSIS — Z1231 Encounter for screening mammogram for malignant neoplasm of breast: Secondary | ICD-10-CM | POA: Insufficient documentation

## 2021-10-31 DIAGNOSIS — Z0000 Encounter for general adult medical examination without abnormal findings: Secondary | ICD-10-CM

## 2021-11-03 ENCOUNTER — Other Ambulatory Visit: Payer: Self-pay | Admitting: Family

## 2021-11-03 DIAGNOSIS — R928 Other abnormal and inconclusive findings on diagnostic imaging of breast: Secondary | ICD-10-CM

## 2021-11-29 ENCOUNTER — Ambulatory Visit
Admission: RE | Admit: 2021-11-29 | Discharge: 2021-11-29 | Disposition: A | Discharge status: Home / Self Care | Payer: BC Managed Care – PPO | Source: Ambulatory Visit | Attending: Family | Admitting: Family

## 2021-11-29 ENCOUNTER — Other Ambulatory Visit: Payer: Self-pay | Admitting: Family

## 2021-11-29 DIAGNOSIS — N6001 Solitary cyst of right breast: Secondary | ICD-10-CM | POA: Diagnosis not present

## 2021-11-29 DIAGNOSIS — R928 Other abnormal and inconclusive findings on diagnostic imaging of breast: Secondary | ICD-10-CM

## 2021-11-29 DIAGNOSIS — N6489 Other specified disorders of breast: Secondary | ICD-10-CM | POA: Diagnosis not present

## 2021-11-29 DIAGNOSIS — R922 Inconclusive mammogram: Secondary | ICD-10-CM | POA: Diagnosis not present

## 2021-12-30 ENCOUNTER — Other Ambulatory Visit: Payer: Self-pay | Admitting: Medical

## 2022-01-04 ENCOUNTER — Other Ambulatory Visit: Payer: Self-pay | Admitting: Medical

## 2022-01-13 ENCOUNTER — Other Ambulatory Visit: Payer: Self-pay | Admitting: Medical

## 2022-03-02 ENCOUNTER — Encounter (HOSPITAL_COMMUNITY): Payer: Self-pay

## 2022-03-02 ENCOUNTER — Ambulatory Visit
Admission: EM | Admit: 2022-03-02 | Discharge: 2022-03-02 | Disposition: A | Discharge status: Other Acute Inpatient Hospital | Payer: BC Managed Care – PPO | Attending: Urgent Care | Admitting: Urgent Care

## 2022-03-02 ENCOUNTER — Other Ambulatory Visit: Payer: Self-pay

## 2022-03-02 ENCOUNTER — Emergency Department (HOSPITAL_COMMUNITY): Payer: BC Managed Care – PPO

## 2022-03-02 ENCOUNTER — Encounter: Payer: Self-pay | Admitting: Emergency Medicine

## 2022-03-02 ENCOUNTER — Inpatient Hospital Stay (HOSPITAL_COMMUNITY)
Admission: EM | Admit: 2022-03-02 | Discharge: 2022-03-09 | DRG: 029 | Disposition: A | Discharge status: Home / Self Care | Payer: BC Managed Care – PPO | Source: Ambulatory Visit | Attending: Internal Medicine | Admitting: Internal Medicine

## 2022-03-02 DIAGNOSIS — F418 Other specified anxiety disorders: Secondary | ICD-10-CM | POA: Diagnosis not present

## 2022-03-02 DIAGNOSIS — Z800 Family history of malignant neoplasm of digestive organs: Secondary | ICD-10-CM

## 2022-03-02 DIAGNOSIS — F17210 Nicotine dependence, cigarettes, uncomplicated: Secondary | ICD-10-CM | POA: Diagnosis not present

## 2022-03-02 DIAGNOSIS — F419 Anxiety disorder, unspecified: Secondary | ICD-10-CM | POA: Diagnosis present

## 2022-03-02 DIAGNOSIS — W1839XA Other fall on same level, initial encounter: Secondary | ICD-10-CM | POA: Diagnosis not present

## 2022-03-02 DIAGNOSIS — I10 Essential (primary) hypertension: Secondary | ICD-10-CM | POA: Diagnosis present

## 2022-03-02 DIAGNOSIS — R29818 Other symptoms and signs involving the nervous system: Secondary | ICD-10-CM | POA: Diagnosis not present

## 2022-03-02 DIAGNOSIS — Z789 Other specified health status: Secondary | ICD-10-CM

## 2022-03-02 DIAGNOSIS — F32A Depression, unspecified: Secondary | ICD-10-CM | POA: Diagnosis not present

## 2022-03-02 DIAGNOSIS — M5031 Other cervical disc degeneration,  high cervical region: Secondary | ICD-10-CM | POA: Diagnosis not present

## 2022-03-02 DIAGNOSIS — Z8249 Family history of ischemic heart disease and other diseases of the circulatory system: Secondary | ICD-10-CM | POA: Diagnosis not present

## 2022-03-02 DIAGNOSIS — R208 Other disturbances of skin sensation: Secondary | ICD-10-CM | POA: Diagnosis not present

## 2022-03-02 DIAGNOSIS — R2681 Unsteadiness on feet: Secondary | ICD-10-CM

## 2022-03-02 DIAGNOSIS — F17200 Nicotine dependence, unspecified, uncomplicated: Secondary | ICD-10-CM

## 2022-03-02 DIAGNOSIS — S0990XA Unspecified injury of head, initial encounter: Secondary | ICD-10-CM | POA: Diagnosis not present

## 2022-03-02 DIAGNOSIS — Z79899 Other long term (current) drug therapy: Secondary | ICD-10-CM

## 2022-03-02 DIAGNOSIS — Z981 Arthrodesis status: Secondary | ICD-10-CM

## 2022-03-02 DIAGNOSIS — M4802 Spinal stenosis, cervical region: Secondary | ICD-10-CM | POA: Diagnosis present

## 2022-03-02 DIAGNOSIS — R55 Syncope and collapse: Secondary | ICD-10-CM | POA: Diagnosis not present

## 2022-03-02 DIAGNOSIS — S199XXA Unspecified injury of neck, initial encounter: Secondary | ICD-10-CM | POA: Diagnosis not present

## 2022-03-02 DIAGNOSIS — M4312 Spondylolisthesis, cervical region: Secondary | ICD-10-CM | POA: Diagnosis not present

## 2022-03-02 DIAGNOSIS — S14129A Central cord syndrome at unspecified level of cervical spinal cord, initial encounter: Secondary | ICD-10-CM | POA: Diagnosis not present

## 2022-03-02 DIAGNOSIS — M50021 Cervical disc disorder at C4-C5 level with myelopathy: Secondary | ICD-10-CM | POA: Diagnosis not present

## 2022-03-02 DIAGNOSIS — Z8261 Family history of arthritis: Secondary | ICD-10-CM

## 2022-03-02 DIAGNOSIS — M4712 Other spondylosis with myelopathy, cervical region: Secondary | ICD-10-CM | POA: Diagnosis not present

## 2022-03-02 DIAGNOSIS — I629 Nontraumatic intracranial hemorrhage, unspecified: Secondary | ICD-10-CM | POA: Diagnosis not present

## 2022-03-02 DIAGNOSIS — M50322 Other cervical disc degeneration at C5-C6 level: Secondary | ICD-10-CM | POA: Diagnosis not present

## 2022-03-02 DIAGNOSIS — M5001 Cervical disc disorder with myelopathy,  high cervical region: Secondary | ICD-10-CM | POA: Diagnosis present

## 2022-03-02 DIAGNOSIS — S14123A Central cord syndrome at C3 level of cervical spinal cord, initial encounter: Principal | ICD-10-CM | POA: Diagnosis present

## 2022-03-02 DIAGNOSIS — Y92009 Unspecified place in unspecified non-institutional (private) residence as the place of occurrence of the external cause: Secondary | ICD-10-CM

## 2022-03-02 DIAGNOSIS — Z9889 Other specified postprocedural states: Secondary | ICD-10-CM | POA: Diagnosis not present

## 2022-03-02 DIAGNOSIS — S0081XA Abrasion of other part of head, initial encounter: Secondary | ICD-10-CM

## 2022-03-02 DIAGNOSIS — F1090 Alcohol use, unspecified, uncomplicated: Secondary | ICD-10-CM

## 2022-03-02 LAB — URINALYSIS, ROUTINE W REFLEX MICROSCOPIC
Bilirubin Urine: NEGATIVE
Glucose, UA: NEGATIVE mg/dL
Ketones, ur: 20 mg/dL — AB
Leukocytes,Ua: NEGATIVE
Nitrite: NEGATIVE
Protein, ur: NEGATIVE mg/dL
Specific Gravity, Urine: 1.005 (ref 1.005–1.030)
pH: 6 (ref 5.0–8.0)

## 2022-03-02 LAB — CBC
HCT: 43.4 % (ref 36.0–46.0)
Hemoglobin: 15.4 g/dL — ABNORMAL HIGH (ref 12.0–15.0)
MCH: 34.1 pg — ABNORMAL HIGH (ref 26.0–34.0)
MCHC: 35.5 g/dL (ref 30.0–36.0)
MCV: 96 fL (ref 80.0–100.0)
Platelets: 386 10*3/uL (ref 150–400)
RBC: 4.52 MIL/uL (ref 3.87–5.11)
RDW: 13.2 % (ref 11.5–15.5)
WBC: 13.3 10*3/uL — ABNORMAL HIGH (ref 4.0–10.5)
nRBC: 0 % (ref 0.0–0.2)

## 2022-03-02 LAB — COMPREHENSIVE METABOLIC PANEL
ALT: 20 U/L (ref 0–44)
AST: 25 U/L (ref 15–41)
Albumin: 4.6 g/dL (ref 3.5–5.0)
Alkaline Phosphatase: 31 U/L — ABNORMAL LOW (ref 38–126)
Anion gap: 12 (ref 5–15)
BUN: 9 mg/dL (ref 6–20)
CO2: 22 mmol/L (ref 22–32)
Calcium: 9.5 mg/dL (ref 8.9–10.3)
Chloride: 103 mmol/L (ref 98–111)
Creatinine, Ser: 0.59 mg/dL (ref 0.44–1.00)
GFR, Estimated: >60 mL/min (ref >60)
Glucose, Bld: 91 mg/dL (ref 70–99)
Potassium: 3.8 mmol/L (ref 3.5–5.1)
Sodium: 137 mmol/L (ref 135–145)
Total Bilirubin: 1 mg/dL (ref 0.3–1.2)
Total Protein: 7.2 g/dL (ref 6.5–8.1)

## 2022-03-02 LAB — I-STAT BETA HCG BLOOD, ED (MC, WL, AP ONLY): I-stat hCG, quantitative: <5 m[IU]/mL (ref <5)

## 2022-03-02 LAB — TROPONIN I (HIGH SENSITIVITY)
Troponin I (High Sensitivity): 3 ng/L (ref <18)
Troponin I (High Sensitivity): 3 ng/L (ref <18)

## 2022-03-02 LAB — ETHANOL: Alcohol, Ethyl (B): <10 mg/dL (ref <10)

## 2022-03-02 LAB — RAPID URINE DRUG SCREEN, HOSP PERFORMED
Amphetamines: NOT DETECTED
Barbiturates: NOT DETECTED
Benzodiazepines: NOT DETECTED
Cocaine: NOT DETECTED
Opiates: NOT DETECTED
Tetrahydrocannabinol: POSITIVE — AB

## 2022-03-02 MED ORDER — THIAMINE HCL 100 MG/ML IJ SOLN
100.0000 mg | Freq: Every day | INTRAMUSCULAR | Status: DC
  Filled 2022-03-02 (×3): qty 2

## 2022-03-02 MED ORDER — MORPHINE SULFATE (PF) 4 MG/ML IV SOLN
4.0000 mg | Freq: Once | INTRAVENOUS | Status: AC
  Administered 2022-03-02: 4 mg via INTRAVENOUS
  Filled 2022-03-02: qty 1

## 2022-03-02 MED ORDER — FENTANYL CITRATE PF 50 MCG/ML IJ SOSY
50.0000 ug | PREFILLED_SYRINGE | Freq: Once | INTRAMUSCULAR | Status: AC
  Administered 2022-03-02: 50 ug via INTRAVENOUS
  Filled 2022-03-02: qty 1

## 2022-03-02 MED ORDER — LORAZEPAM 2 MG/ML IJ SOLN
1.0000 mg | Freq: Once | INTRAMUSCULAR | Status: AC | PRN
  Administered 2022-03-02: 1 mg via INTRAVENOUS
  Filled 2022-03-02: qty 1

## 2022-03-02 MED ORDER — SODIUM CHLORIDE 0.9 % IV BOLUS
1000.0000 mL | Freq: Once | INTRAVENOUS | Status: AC
  Administered 2022-03-02: 1000 mL via INTRAVENOUS

## 2022-03-02 NOTE — ED Notes (Signed)
Patient transported to MRI 

## 2022-03-02 NOTE — ED Triage Notes (Signed)
Pt here with loss of consciousness last night and hit her head and then again at 3am. Pt feels shaky and unstable and slightly dizzy. Feels most unsteady when getting up from seated position. Pt reports having 3 glasses of wine, but that is not a lot for her.

## 2022-03-02 NOTE — ED Provider Triage Note (Signed)
Emergency Medicine Provider Triage Evaluation Note  Alexis English , a 56 y.o. female  was evaluated in triage.  Pt complains of syncope x 2 yesterday with facial injuries   Review of Systems  Positive: loc Negative: Fever   Physical Exam  BP (!) 155/91 (BP Location: Left Arm)   Pulse 79   Temp 99.6 F (37.6 C) (Oral)   Resp 16   Ht '5\' 3"'$  (1.6 m)   Wt 54.4 kg   LMP 03/21/2012   SpO2 99%   BMI 21.26 kg/m  Gen:   Awake, no distress , bruisess face  Resp:  Normal effort  MSK:   Moves extremities without difficulty  Other:    Medical Decision Making  Medically screening exam initiated at 12:30 PM.  Appropriate orders placed.  TROY HARTZOG was informed that the remainder of the evaluation will be completed by another provider, this initial triage assessment does not replace that evaluation, and the importance of remaining in the ED until their evaluation is complete.     Fransico Meadow, [place] 03/02/22 1232

## 2022-03-02 NOTE — ED Provider Notes (Addendum)
[place]   MRN: [id number] DOB: June 28, 1966  Subjective:   Alexis English is a 56 y.o. female presenting for suffering 2 syncopal events yesterday going into early this day. The first occurred at ~5:30pm. Patient was having a glass wine (had 3 at the time). Was talking with a friend on the phone at the time, got up and suddenly felt woozy, passed out, hit her forehead as she landed on her wooden deck. She was out for ~2 minutes. She got herself up and with a lot of effort was able to get back into her bed.  She was not able to sleep from superficial pain of her skin on her back. Has had a burning sensation that is severe with any contact made to her skin of the shoulders and upper back. She then again got up to go to the bathroom at ~3am. She passed out again, hit a trash can on the way down. She was out for another ~2 minutes. She laid there for a while and had a difficult time getting herself up due to weakness of her arms.  Has ~3 glasses of wine daily and therefore there are glasses of wine that she had at the time was not out of the ordinary for her. Has a history of HTN. Smokes 1/2ppd.  No history of cerebrovascular disease, heart disease.  She does have a history of pulmonary nodule.   Current Facility-Administered Medications:    0.9 %  sodium chloride infusion, 500 mL, Intravenous, Once, Jackquline Denmark, MD  Current Outpatient Medications:    amLODipine (NORVASC) 10 MG tablet, TAKE 1 TABLET BY MOUTH EVERY DAY, Disp: 90 tablet, Rfl: 0   gabapentin (NEURONTIN) 100 MG capsule, 1 cap PO TID prn neck pain, Disp: 45 capsule, Rfl: 0   hydrochlorothiazide (HYDRODIURIL) 25 MG tablet, TAKE 1 TABLET (25 MG TOTAL) BY MOUTH DAILY., Disp: 90 tablet, Rfl: 1   potassium chloride SA (KLOR-CON M20) 20 MEQ tablet, TAKE 1 AND 1/2 TABLETS (30 MEQ TOTAL) BY MOUTH DAILY., Disp: 135 tablet, Rfl: 1   venlafaxine XR (EFFEXOR-XR) 75 MG 24 hr capsule, TAKE 1 CAPSULE BY MOUTH DAILY WITH  BREAKFAST., Disp: 90 capsule, Rfl: 0   Allergies  Allergen Reactions   Hydrocodone Itching   Bupropion Hcl     REACTION: hives   Buspar [Buspirone] Other (See Comments)    Nausea, dizziness, weakness, muscle cramps   Chantix [Varenicline]     dizziness   Propoxyphene Hives   Propoxyphene N-Acetaminophen     REACTION: hives   Zoloft [Sertraline]     hives    Past Medical History:  Diagnosis Date   Anxiety    Depression    History of kidney stones    Hypertension    Pneumonia 08/2010   Pulmonary nodule 08/2010   per CT   Ruptured disc, cervical    C4-5; cervical fusion   Smoker      Past Surgical History:  Procedure Laterality Date   ABDOMINAL HERNIA REPAIR     as a child   CERVICAL FUSION  2000   CESAREAN SECTION     x2   CYSTECTOMY     from back x 2   TONSILLECTOMY      Family History  Problem Relation Age of Onset   Heart attack Mother    Arthritis Mother    Anxiety disorder Mother    Heart attack Father    Colon cancer Father 41  father having chemo now for colon cancer     Social History   Tobacco Use   Smoking status: Every Day    Packs/day: 0.50    Years: 26.00    Total pack years: 13.00    Types: Cigarettes   Smokeless tobacco: Never   Tobacco comments:    trying to stop smoking  Vaping Use   Vaping Use: Never used  Substance Use Topics   Alcohol use: Yes    Alcohol/week: 14.0 standard drinks of alcohol    Types: 14 Glasses of wine per week   Drug use: Not Currently    ROS   Objective:   Vitals: BP (!) 170/98   Pulse 95   Temp 98.4 F (36.9 C)   Resp 20   SpO2 98%   Physical Exam Constitutional:      General: She is not in acute distress.    Appearance: Normal appearance. She is well-developed. She is not ill-appearing, toxic-appearing or diaphoretic.  HENT:     Head: Normocephalic. Abrasion present. No raccoon eyes, Battle's sign, contusion, masses, right periorbital erythema, left periorbital erythema or  laceration. Hair is normal.     Jaw: No trismus, tenderness, swelling, pain on movement or malocclusion.      Right Ear: External ear normal.     Left Ear: External ear normal.     Nose: Nose normal.     Mouth/Throat:     Mouth: Mucous membranes are moist.  Eyes:     General: No scleral icterus.       Right eye: No discharge.        Left eye: No discharge.     Extraocular Movements: Extraocular movements intact.     Conjunctiva/sclera: Conjunctivae normal.     Pupils: Pupils are equal, round, and reactive to light.  Cardiovascular:     Rate and Rhythm: Normal rate and regular rhythm.     Heart sounds: Normal heart sounds. No murmur heard.    No friction rub. No gallop.  Pulmonary:     Effort: Pulmonary effort is normal. No respiratory distress.     Breath sounds: No stridor. No wheezing, rhonchi or rales.     Comments: Slightly decreased lung sounds. Chest:     Chest wall: No tenderness.  Skin:    General: Skin is warm and dry.  Neurological:     General: No focal deficit present.     Mental Status: She is alert and oriented to person, place, and time.     Cranial Nerves: No cranial nerve deficit, dysarthria or facial asymmetry.     Motor: Weakness present. No tremor, abnormal muscle tone or pronator drift.     Coordination: Romberg sign negative. Coordination abnormal.     Gait: Gait abnormal.     Deep Tendon Reflexes: Reflexes normal.  Psychiatric:        Mood and Affect: Mood is anxious.        Behavior: Behavior normal.        Thought Content: Thought content normal.        Judgment: Judgment normal.    ED ECG REPORT   Date: 03/02/2022  EKG Time: 11:12 AM  Rate: 74bpm  Rhythm: normal sinus rhythm,  unchanged from previous tracings  Axis: Right  Intervals:none  ST&T Change: non-specific t-wave flattening  Narrative Interpretation: sinus rhythm with rightward axis at 74bpm, non-specific t-wave flattening in V2-V3. Comparable to previous ecg.   Assessment and Plan  :   PDMP not  reviewed this encounter.  1. Syncope and collapse   2. Burning sensation   3. Alcohol use   4. Smoker   5. Essential hypertension    Patient suffered 2 separate syncope and collapse episodes within the past 24 hours. She has facial abrasions that confirm it was complete collapse with injury to her face. She has neuropathic symptoms in addition. Alcohol use may be playing a role. However, at this stage patient is in need of a higher level of testing and care than we can provide in the urgent care setting. Deferred transport by ambulance given clear cardiopulmonary exam, normal neurologic exam. Presents with a friend that agrees to take her to the hospital now.       Jaynee Eagles, PA-C 03/02/22 1148

## 2022-03-02 NOTE — Discharge Instructions (Addendum)
Please head straight to the emergency room as I am concerned that you need a higher level of testing and care than we can provide. You have had 2 separate episodes of syncope and collapse,  have ongoing weakness and an unsteady gait. This requires imaging such as a head CT or MRI, labs and more cardiac testing. Please go straight to the emergency room now for this levels of testing and care.

## 2022-03-02 NOTE — ED Triage Notes (Signed)
Reports yesterday had 3 glasses of wine and went to stand up and had syncopal episode and face planted.  She was on the phone with friend.  Made her way upstairs to bed and got up at 3am had another syncopal episode.

## 2022-03-02 NOTE — ED Provider Notes (Signed)
Emergency Department Provider Note   I have reviewed the triage vital signs and the nursing notes.   HISTORY  Chief Complaint Loss of Consciousness   HPI NYLIAH NIERENBERG is a 56 y.o. female past medical history reviewed below presents emergency department for evaluation after syncope episode x2.  Patient admits that she does drink regularly often 2 to 3 glasses of wine per night.  She states she never had significant issue with this.  She was drinking yesterday and upon standing she immediately lost consciousness and woke up on the floor.  No prodrome.  She found that she was very weak and unable to walk and so crawled upstairs to her bed and lay down.  She woke up around 3 AM and found that she was unable to walk.  She attempted to walk but again passed out without prodrome.  Specifically no chest pain/palpitations, shortness of breath, sudden headache.  She is experiencing generalized weakness and unsteady gait.  Denies any numbness.  She has a burning pain sensation to the bilateral upper extremities.  She initially went to urgent care and was referred here for further evaluation. She is able to stand only with her friend's significant assistance.    Past Medical History:  Diagnosis Date   Anxiety    Depression    History of kidney stones    Hypertension    Pneumonia 08/2010   Pulmonary nodule 08/2010   per CT   Ruptured disc, cervical    C4-5; cervical fusion   Smoker     Review of Systems  Constitutional: No fever/chills Eyes: No visual changes. ENT: No sore throat. Cardiovascular: Denies chest pain. Positive syncope x 2.  Respiratory: Denies shortness of breath. Gastrointestinal: No abdominal pain.  No nausea, no vomiting.  No diarrhea.  No constipation. Genitourinary: Negative for dysuria. Musculoskeletal: Negative for back pain. Skin: Negative for rash. Neurological: Mild HA starting this afternoon. Positive gait instability.     ____________________________________________   PHYSICAL EXAM:  VITAL SIGNS: ED Triage Vitals  Enc Vitals Group     BP 03/02/22 1221 (!) 155/91     Pulse Rate 03/02/22 1221 79     Resp 03/02/22 1221 16     Temp 03/02/22 1221 99.6 F (37.6 C)     Temp Source 03/02/22 1221 Oral     SpO2 03/02/22 1221 99 %     Weight 03/02/22 1220 120 lb (54.4 kg)     Height 03/02/22 1220 '5\' 3"'$  (1.6 m)   Constitutional: Alert and oriented. No acute distress.  Eyes: Conjunctivae are normal. No nystagmus.  Head: Atraumatic. Nose: No congestion/rhinnorhea. Mouth/Throat: Mucous membranes are moist.   Neck: No stridor.  No cervical spine tenderness to palpation. Cardiovascular: Normal rate, regular rhythm. Good peripheral circulation. Grossly normal heart sounds.   Respiratory: Normal respiratory effort.  No retractions. Lungs CTAB. Gastrointestinal: Soft and nontender. No distention.  Musculoskeletal: No lower extremity tenderness nor edema. No gross deformities of extremities. Neurologic:  Normal speech and language. 4+/5 strength in the bilateral upper/lower extremities. No numbness. No pronator drift.  Skin:  Skin is warm, dry and intact. No rash noted.   ____________________________________________   LABS (all labs ordered are listed, but only abnormal results are displayed)  Labs Reviewed  CBC - Abnormal; Notable for the following components:      Result Value   WBC 13.3 (*)    Hemoglobin 15.4 (*)    MCH 34.1 (*)    All other components within  normal limits  URINALYSIS, ROUTINE W REFLEX MICROSCOPIC - Abnormal; Notable for the following components:   Color, Urine STRAW (*)    Hgb urine dipstick SMALL (*)    Ketones, ur 20 (*)    Bacteria, UA RARE (*)    All other components within normal limits  COMPREHENSIVE METABOLIC PANEL - Abnormal; Notable for the following components:   Alkaline Phosphatase 31 (*)    All other components within normal limits  RAPID URINE DRUG SCREEN, HOSP  PERFORMED - Abnormal; Notable for the following components:   Tetrahydrocannabinol POSITIVE (*)    All other components within normal limits  ETHANOL  VITAMIN B1  I-STAT BETA HCG BLOOD, ED ([facility], [facility], [facility] ONLY)  CBG MONITORING, ED  TROPONIN I (HIGH SENSITIVITY)  TROPONIN I (HIGH SENSITIVITY)   ____________________________________________  EKG  Rate: 85 PR: 148 QTc: 440  Sinus rhythm. Narrow QRS. No ST changes.   ____________________________________________  RADIOLOGY  CT Head Wo Contrast  Result Date: 03/02/2022 CLINICAL DATA:  Syncope/presyncope, cerebrovascular cause suspected; Neck trauma, dangerous injury mechanism (Age 17-64y) EXAM: CT HEAD WITHOUT CONTRAST CT CERVICAL SPINE WITHOUT CONTRAST TECHNIQUE: Multidetector CT imaging of the head and cervical spine was performed following the standard protocol without intravenous contrast. Multiplanar CT image reconstructions of the cervical spine were also generated. RADIATION DOSE REDUCTION: This exam was performed according to the departmental dose-optimization program which includes automated exposure control, adjustment of the mA and/or kV according to patient size and/or use of iterative reconstruction technique. COMPARISON:  None Available. FINDINGS: CT HEAD FINDINGS Brain: No evidence of acute infarction, hemorrhage, hydrocephalus, extra-axial collection or mass lesion/mass effect. Vascular: No hyperdense vessel or unexpected calcification. Skull: Normal. Negative for fracture or focal lesion. Sinuses/Orbits: No acute finding. Other: Negative for scalp hematoma. CT CERVICAL SPINE FINDINGS Alignment: Facet joints are aligned without dislocation or traumatic listhesis. Dens and lateral masses are aligned. Straightening of the cervical lordosis. Skull base and vertebrae: Prior C4-5 ACDF with solid ankylosis. No acute fracture. No pathologic bone process identified. Soft tissues and spinal canal: No prevertebral fluid or swelling. No visible  canal hematoma. Disc levels:  Degenerative disc disease at C3-4 and C5-6. Upper chest: Negative. Other: None. IMPRESSION: 1. No acute intracranial abnormality. 2. No acute fracture or subluxation of the cervical spine. 3. Prior C4-5 ACDF with solid ankylosis. Adjacent segment disease at C3-4 and C5-6. Electronically Signed   By: Davina Poke D.O.   On: 03/02/2022 13:01   CT Cervical Spine Wo Contrast  Result Date: 03/02/2022 CLINICAL DATA:  Syncope/presyncope, cerebrovascular cause suspected; Neck trauma, dangerous injury mechanism (Age 2-64y) EXAM: CT HEAD WITHOUT CONTRAST CT CERVICAL SPINE WITHOUT CONTRAST TECHNIQUE: Multidetector CT imaging of the head and cervical spine was performed following the standard protocol without intravenous contrast. Multiplanar CT image reconstructions of the cervical spine were also generated. RADIATION DOSE REDUCTION: This exam was performed according to the departmental dose-optimization program which includes automated exposure control, adjustment of the mA and/or kV according to patient size and/or use of iterative reconstruction technique. COMPARISON:  None Available. FINDINGS: CT HEAD FINDINGS Brain: No evidence of acute infarction, hemorrhage, hydrocephalus, extra-axial collection or mass lesion/mass effect. Vascular: No hyperdense vessel or unexpected calcification. Skull: Normal. Negative for fracture or focal lesion. Sinuses/Orbits: No acute finding. Other: Negative for scalp hematoma. CT CERVICAL SPINE FINDINGS Alignment: Facet joints are aligned without dislocation or traumatic listhesis. Dens and lateral masses are aligned. Straightening of the cervical lordosis. Skull base and vertebrae: Prior C4-5 ACDF with solid  ankylosis. No acute fracture. No pathologic bone process identified. Soft tissues and spinal canal: No prevertebral fluid or swelling. No visible canal hematoma. Disc levels:  Degenerative disc disease at C3-4 and C5-6. Upper chest: Negative. Other:  None. IMPRESSION: 1. No acute intracranial abnormality. 2. No acute fracture or subluxation of the cervical spine. 3. Prior C4-5 ACDF with solid ankylosis. Adjacent segment disease at C3-4 and C5-6. Electronically Signed   By: Davina Poke D.O.   On: 03/02/2022 13:01    ____________________________________________   PROCEDURES  Procedure(s) performed:   Procedures  None ____________________________________________   INITIAL IMPRESSION / ASSESSMENT AND PLAN / ED COURSE  Pertinent labs & imaging results that were available during my care of the patient were reviewed by me and considered in my medical decision making (see chart for details).   This patient is Presenting for Evaluation of syncope, which does require a range of treatment options, and is a complaint that involves a high risk of morbidity and mortality.  The Differential Diagnoses includes but not exclusive to cardiogenic syncope, seizure, dehydration, EtOH intoxication, Warnicke.   Critical Interventions-    Medications  thiamine (B-1) injection 100 mg (has no administration in time range)  sodium chloride 0.9 % bolus 1,000 mL (0 mLs Intravenous Stopped 03/02/22 2106)  fentaNYL (SUBLIMAZE) injection 50 mcg (50 mcg Intravenous Given 03/02/22 1957)  LORazepam (ATIVAN) injection 1 mg (1 mg Intravenous Given 03/02/22 2205)  morphine (PF) 4 MG/ML injection 4 mg (4 mg Intravenous Given 03/02/22 2109)    Reassessment after intervention: Symptoms not significantly improved with IVF and pain medication.    I did obtain Additional Historical Information from friends at bedside.  I decided to review pertinent External Data, and in summary patient seen today at [facility] and transferred here for evaluation.   Clinical Laboratory Tests Ordered, included patient with WBC count of 13.3.  No fevers or other infection symptoms.  No evidence of urinary tract infection.  Vitamin B1 level sent but not resulted yet.  Alcohol level negative.   No acute kidney injury or electrolyte disturbance.  Troponin negative.  Radiologic Tests Ordered, included CT head and c spine. I independently interpreted the images and agree with radiology interpretation. MRI pending.   Cardiac Monitor Tracing which shows NSR.   Social Determinants of Health Risk patient with a smoking and EtOH history.   Consult complete with [facility], Dr. Myna Hidalgo. Plan for admit.   Medical Decision Making: Summary:  Patient presents emergency department for evaluation of syncope x2 and significant gait abnormality, even after sobering from last night.  Patient is a regular drinker.  No confusion but Warnicke's is a consideration.  I did send a B1 level and will give lower dose thiamine for now.  CT imaging of the head and cervical spine did not show any acute traumatic injury.  We will send patient for MRI brain and C-spine with gait instability but also concerned regarding potential for cardiogenic syncope with no clear prodrome prior to passing out.  Reevaluation with update and discussion with patient. Continues to not feel much better after IVF and pain mgmt. Plan for [facility] admit for syncope evaluation.    Disposition: admit  ____________________________________________  FINAL CLINICAL IMPRESSION(S) / ED DIAGNOSES  Final diagnoses:  Syncope and collapse  Gait instability    Note:  This document was prepared using Dragon voice recognition software and may include unintentional dictation errors.  Nanda Quinton, MD, [facility] Emergency Medicine    Javarri Segal, [facility], MD 03/02/22  [id number]  

## 2022-03-03 ENCOUNTER — Encounter (HOSPITAL_COMMUNITY): Payer: Self-pay | Admitting: Family Medicine

## 2022-03-03 ENCOUNTER — Observation Stay (HOSPITAL_COMMUNITY): Payer: BC Managed Care – PPO

## 2022-03-03 DIAGNOSIS — I10 Essential (primary) hypertension: Secondary | ICD-10-CM | POA: Diagnosis present

## 2022-03-03 DIAGNOSIS — F17210 Nicotine dependence, cigarettes, uncomplicated: Secondary | ICD-10-CM | POA: Diagnosis present

## 2022-03-03 DIAGNOSIS — M4802 Spinal stenosis, cervical region: Secondary | ICD-10-CM | POA: Diagnosis present

## 2022-03-03 DIAGNOSIS — W1839XA Other fall on same level, initial encounter: Secondary | ICD-10-CM | POA: Diagnosis present

## 2022-03-03 DIAGNOSIS — R55 Syncope and collapse: Secondary | ICD-10-CM

## 2022-03-03 DIAGNOSIS — Z8261 Family history of arthritis: Secondary | ICD-10-CM | POA: Diagnosis not present

## 2022-03-03 DIAGNOSIS — Z800 Family history of malignant neoplasm of digestive organs: Secondary | ICD-10-CM | POA: Diagnosis not present

## 2022-03-03 DIAGNOSIS — Z8249 Family history of ischemic heart disease and other diseases of the circulatory system: Secondary | ICD-10-CM | POA: Diagnosis not present

## 2022-03-03 DIAGNOSIS — Z981 Arthrodesis status: Secondary | ICD-10-CM | POA: Diagnosis not present

## 2022-03-03 DIAGNOSIS — M5001 Cervical disc disorder with myelopathy,  high cervical region: Secondary | ICD-10-CM | POA: Diagnosis present

## 2022-03-03 DIAGNOSIS — S14123A Central cord syndrome at C3 level of cervical spinal cord, initial encounter: Secondary | ICD-10-CM | POA: Diagnosis present

## 2022-03-03 DIAGNOSIS — Y92009 Unspecified place in unspecified non-institutional (private) residence as the place of occurrence of the external cause: Secondary | ICD-10-CM | POA: Diagnosis not present

## 2022-03-03 DIAGNOSIS — M4712 Other spondylosis with myelopathy, cervical region: Secondary | ICD-10-CM | POA: Diagnosis present

## 2022-03-03 DIAGNOSIS — Z79899 Other long term (current) drug therapy: Secondary | ICD-10-CM | POA: Diagnosis not present

## 2022-03-03 DIAGNOSIS — F419 Anxiety disorder, unspecified: Secondary | ICD-10-CM | POA: Diagnosis present

## 2022-03-03 DIAGNOSIS — F32A Depression, unspecified: Secondary | ICD-10-CM | POA: Diagnosis present

## 2022-03-03 LAB — HIV ANTIBODY (ROUTINE TESTING W REFLEX): HIV Screen 4th Generation wRfx: NONREACTIVE

## 2022-03-03 LAB — CBC
HCT: 41.2 % (ref 36.0–46.0)
Hemoglobin: 14.3 g/dL (ref 12.0–15.0)
MCH: 33.6 pg (ref 26.0–34.0)
MCHC: 34.7 g/dL (ref 30.0–36.0)
MCV: 96.7 fL (ref 80.0–100.0)
Platelets: 330 10*3/uL (ref 150–400)
RBC: 4.26 MIL/uL (ref 3.87–5.11)
RDW: 13.3 % (ref 11.5–15.5)
WBC: 7.4 10*3/uL (ref 4.0–10.5)
nRBC: 0 % (ref 0.0–0.2)

## 2022-03-03 LAB — ECHOCARDIOGRAM COMPLETE
Area-P 1/2: 3.72 cm2
Height: 63 in
S' Lateral: 2.7 cm
Weight: 1920 oz

## 2022-03-03 LAB — SURGICAL PCR SCREEN
MRSA, PCR: NEGATIVE
Staphylococcus aureus: NEGATIVE

## 2022-03-03 LAB — BASIC METABOLIC PANEL
Anion gap: 7 (ref 5–15)
BUN: 8 mg/dL (ref 6–20)
CO2: 26 mmol/L (ref 22–32)
Calcium: 8.7 mg/dL — ABNORMAL LOW (ref 8.9–10.3)
Chloride: 108 mmol/L (ref 98–111)
Creatinine, Ser: 0.62 mg/dL (ref 0.44–1.00)
GFR, Estimated: >60 mL/min (ref >60)
Glucose, Bld: 114 mg/dL — ABNORMAL HIGH (ref 70–99)
Potassium: 3.5 mmol/L (ref 3.5–5.1)
Sodium: 141 mmol/L (ref 135–145)

## 2022-03-03 LAB — CBG MONITORING, ED: Glucose-Capillary: 101 mg/dL — ABNORMAL HIGH (ref 70–99)

## 2022-03-03 MED ORDER — LORAZEPAM 2 MG/ML IJ SOLN: 1.0000 mg | INTRAMUSCULAR | Status: AC | PRN

## 2022-03-03 MED ORDER — CALCIUM CARBONATE ANTACID 500 MG PO CHEW
1.0000 | CHEWABLE_TABLET | Freq: Three times a day (TID) | ORAL | Status: DC | PRN
  Administered 2022-03-03: 200 mg via ORAL
  Filled 2022-03-03: qty 1

## 2022-03-03 MED ORDER — GABAPENTIN 100 MG PO CAPS: 100.0000 mg | ORAL_CAPSULE | Freq: Three times a day (TID) | ORAL | Status: DC

## 2022-03-03 MED ORDER — ADULT MULTIVITAMIN W/MINERALS CH
1.0000 | ORAL_TABLET | Freq: Every day | ORAL | Status: DC
  Administered 2022-03-03 – 2022-03-08 (×5): 1 via ORAL
  Filled 2022-03-03 (×7): qty 1

## 2022-03-03 MED ORDER — PNEUMOCOCCAL 20-VAL CONJ VACC 0.5 ML IM SUSY
0.5000 mL | PREFILLED_SYRINGE | INTRAMUSCULAR | Status: DC
  Filled 2022-03-03: qty 0.5

## 2022-03-03 MED ORDER — THIAMINE HCL 100 MG/ML IJ SOLN
100.0000 mg | Freq: Every day | INTRAMUSCULAR | Status: DC
  Administered 2022-03-06: 100 mg via INTRAVENOUS
  Filled 2022-03-03: qty 2

## 2022-03-03 MED ORDER — DEXAMETHASONE SODIUM PHOSPHATE 4 MG/ML IJ SOLN
4.0000 mg | Freq: Four times a day (QID) | INTRAMUSCULAR | Status: DC
  Administered 2022-03-03 – 2022-03-07 (×17): 4 mg via INTRAVENOUS
  Filled 2022-03-03 (×17): qty 1

## 2022-03-03 MED ORDER — LORAZEPAM 2 MG/ML IJ SOLN
0.0000 mg | Freq: Four times a day (QID) | INTRAMUSCULAR | Status: DC
  Administered 2022-03-03: 2 mg via INTRAVENOUS
  Filled 2022-03-03: qty 1

## 2022-03-03 MED ORDER — LORAZEPAM 1 MG PO TABS
1.0000 mg | ORAL_TABLET | ORAL | Status: AC | PRN
  Filled 2022-03-03: qty 1

## 2022-03-03 MED ORDER — FOLIC ACID 1 MG PO TABS
1.0000 mg | ORAL_TABLET | Freq: Every day | ORAL | Status: DC
  Administered 2022-03-03 – 2022-03-09 (×6): 1 mg via ORAL
  Filled 2022-03-03 (×7): qty 1

## 2022-03-03 MED ORDER — SENNOSIDES-DOCUSATE SODIUM 8.6-50 MG PO TABS
1.0000 | ORAL_TABLET | Freq: Every evening | ORAL | Status: DC | PRN
  Administered 2022-03-05: 1 via ORAL
  Filled 2022-03-03: qty 1

## 2022-03-03 MED ORDER — ACETAMINOPHEN 325 MG PO TABS
650.0000 mg | ORAL_TABLET | Freq: Four times a day (QID) | ORAL | Status: DC | PRN
  Administered 2022-03-03: 650 mg via ORAL
  Filled 2022-03-03: qty 2

## 2022-03-03 MED ORDER — GABAPENTIN 300 MG PO CAPS
300.0000 mg | ORAL_CAPSULE | Freq: Three times a day (TID) | ORAL | Status: DC
  Administered 2022-03-03 – 2022-03-07 (×10): 300 mg via ORAL
  Filled 2022-03-03 (×11): qty 1

## 2022-03-03 MED ORDER — ONDANSETRON HCL 4 MG/2ML IJ SOLN: 4.0000 mg | Freq: Four times a day (QID) | INTRAMUSCULAR | Status: DC | PRN

## 2022-03-03 MED ORDER — GABAPENTIN 100 MG PO CAPS
100.0000 mg | ORAL_CAPSULE | Freq: Three times a day (TID) | ORAL | Status: DC
  Administered 2022-03-03: 100 mg via ORAL
  Filled 2022-03-03: qty 1

## 2022-03-03 MED ORDER — ONDANSETRON HCL 4 MG PO TABS: 4.0000 mg | ORAL_TABLET | Freq: Four times a day (QID) | ORAL | Status: DC | PRN

## 2022-03-03 MED ORDER — LORAZEPAM 2 MG/ML IJ SOLN: 0.0000 mg | Freq: Two times a day (BID) | INTRAMUSCULAR | Status: DC

## 2022-03-03 MED ORDER — SODIUM CHLORIDE 0.9 % IV SOLN: INTRAVENOUS | Status: AC

## 2022-03-03 MED ORDER — AMLODIPINE BESYLATE 10 MG PO TABS
10.0000 mg | ORAL_TABLET | Freq: Every day | ORAL | Status: DC
  Administered 2022-03-03 – 2022-03-09 (×6): 10 mg via ORAL
  Filled 2022-03-03: qty 1
  Filled 2022-03-03: qty 2
  Filled 2022-03-03 (×5): qty 1

## 2022-03-03 MED ORDER — OXYCODONE HCL 5 MG PO TABS
5.0000 mg | ORAL_TABLET | ORAL | Status: DC | PRN
  Administered 2022-03-03 – 2022-03-06 (×9): 5 mg via ORAL
  Filled 2022-03-03 (×9): qty 1

## 2022-03-03 MED ORDER — HYDROMORPHONE HCL 1 MG/ML IJ SOLN
1.0000 mg | INTRAMUSCULAR | Status: DC | PRN
  Administered 2022-03-03 – 2022-03-07 (×13): 1 mg via INTRAVENOUS
  Filled 2022-03-03 (×15): qty 1

## 2022-03-03 MED ORDER — THIAMINE HCL 100 MG PO TABS
100.0000 mg | ORAL_TABLET | Freq: Every day | ORAL | Status: DC
  Administered 2022-03-03 – 2022-03-09 (×6): 100 mg via ORAL
  Filled 2022-03-03 (×6): qty 1

## 2022-03-03 MED ORDER — ACETAMINOPHEN 650 MG RE SUPP: 650.0000 mg | Freq: Four times a day (QID) | RECTAL | Status: DC | PRN

## 2022-03-03 MED ORDER — VENLAFAXINE HCL ER 75 MG PO CP24
75.0000 mg | ORAL_CAPSULE | Freq: Every day | ORAL | Status: DC
  Administered 2022-03-03 – 2022-03-09 (×7): 75 mg via ORAL
  Filled 2022-03-03 (×8): qty 1

## 2022-03-03 MED ORDER — NICOTINE POLACRILEX 2 MG MT GUM: 2.0000 mg | CHEWING_GUM | OROMUCOSAL | Status: DC | PRN

## 2022-03-03 MED ORDER — SODIUM CHLORIDE 0.9% FLUSH
3.0000 mL | Freq: Two times a day (BID) | INTRAVENOUS | Status: DC
Start: 2022-03-03 — End: 2022-03-09
  Administered 2022-03-03 – 2022-03-09 (×10): 3 mL via INTRAVENOUS

## 2022-03-03 NOTE — ED Notes (Signed)
ED TO INPATIENT HANDOFF REPORT S Name/Age/Gender Alexis English 56 y.o. female Room/Bed: 042C/042C  Code Status   Code Status: Full Code  Home/SNF/Other Home Patient oriented to: self, place, time, and situation Is this baseline? Yes   Triage Complete: Triage complete  Chief Complaint Syncope [R55]  Triage Note Reports yesterday had 3 glasses of wine and went to stand up and had syncopal episode and face planted.  She was on the phone with friend.  Made her way upstairs to bed and got up at 3am had another syncopal episode.     Allergies Allergies  Allergen Reactions   Hydrocodone Itching   Bupropion Hcl     REACTION: hives   Buspar [Buspirone] Other (See Comments)    Nausea, dizziness, weakness, muscle cramps   Chantix [Varenicline]     dizziness   Propoxyphene Hives   Propoxyphene N-Acetaminophen     REACTION: hives   Zoloft [Sertraline]     hives    Level of Care/Admitting Diagnosis ED Disposition     ED Disposition  Admit   Condition  --   [place]: [facility] [100100]  Level of Care: Telemetry Medical [104]  May admit patient to [facility] or [facility] if equivalent level of care is available:: Yes  Covid Evaluation: Asymptomatic - no recent exposure (last 10 days) testing not required  Diagnosis: Syncope [206001]  Admitting Physician: Vianne Bulls [[id number]]  Attending Physician: Tarry Kos  Estimated length of stay: past midnight tomorrow  Certification:: I certify this patient will need inpatient services for at least 2 midnights          B Medical/Surgery History Past Medical History:  Diagnosis Date   Anxiety    Depression    History of kidney stones    Hypertension    Pneumonia 08/2010   Pulmonary nodule 08/2010   per CT   Ruptured disc, cervical    C4-5; cervical fusion   Smoker    Past Surgical History:  Procedure Laterality Date   ABDOMINAL HERNIA REPAIR     as a child    [place]     x2   CYSTECTOMY     from back x 2   TONSILLECTOMY       A IV Location/Drains/Wounds Patient Lines/Drains/Airways Status     Active Line/Drains/Airways     Name Placement date Placement time Site Days   Peripheral IV 03/02/22 20 G Left Antecubital 03/02/22  1956  Antecubital  1            Intake/Output Last 24 hours No intake or output data in the 24 hours ending 03/03/22 1406  Labs/Imaging Results for orders placed or performed during the hospital encounter of 03/02/22 (from the past 48 hour(s))  Ethanol     Status: None   Collection Time: 03/02/22 12:37 PM  Result Value Ref Range   Alcohol, Ethyl (B) <10 <10 mg/dL    Comment: (NOTE) Lowest detectable limit for serum alcohol is 10 mg/dL.  For medical purposes only. Performed at [facility], [place] [address] [place] [address]   CBC     Status: Abnormal   Collection Time: 03/02/22 12:39 PM  Result Value Ref Range   WBC 13.3 (H) 4.0 - 10.5 K/uL   RBC 4.52 3.87 - 5.11 MIL/uL   Hemoglobin 15.4 (H) 12.0 - 15.0 g/dL   HCT 43.4 36.0 - 46.0 %  MCV 96.0 80.0 - 100.0 fL   MCH 34.1 (H) 26.0 - 34.0 pg   MCHC 35.5 30.0 - 36.0 g/dL   RDW 13.2 11.5 - 15.5 %   Platelets 386 150 - 400 K/uL   nRBC 0.0 0.0 - 0.2 %    Comment: Performed at [facility], [place] [address], [place], [place] [address]  Comprehensive metabolic panel     Status: Abnormal   Collection Time: 03/02/22 12:39 PM  Result Value Ref Range   Sodium 137 135 - 145 mmol/L   Potassium 3.8 3.5 - 5.1 mmol/L   Chloride 103 98 - 111 mmol/L   CO2 22 22 - 32 mmol/L   Glucose, Bld 91 70 - 99 mg/dL    Comment: Glucose reference range applies only to samples taken after fasting for at least 8 hours.   BUN 9 6 - 20 mg/dL   Creatinine, Ser 0.59 0.44 - 1.00 mg/dL   Calcium 9.5 8.9 - 10.3 mg/dL   Total Protein 7.2 6.5 - 8.1 g/dL   Albumin 4.6 3.5 - 5.0 g/dL   AST 25 15 - 41 U/L   ALT 20 0 - 44 U/L    Alkaline Phosphatase 31 (L) 38 - 126 U/L   Total Bilirubin 1.0 0.3 - 1.2 mg/dL   GFR, Estimated >60 >60 mL/min    Comment: (NOTE) Calculated using the CKD-EPI Creatinine Equation (2021)    Anion gap 12 5 - 15    Comment: Performed at [facility] [address] [place], [place] [address]  Troponin I (High Sensitivity)     Status: None   Collection Time: 03/02/22 12:39 PM  Result Value Ref Range   Troponin I (High Sensitivity) 3 <18 ng/L    Comment: (NOTE) Elevated high sensitivity troponin I (hsTnI) values and significant  changes across serial measurements may suggest ACS but many other  chronic and acute conditions are known to elevate hsTnI results.  Refer to the "Links" section for chest pain algorithms and additional  guidance. Performed at [facility], [place] [address] [place], [place] [address]   I-Stat beta hCG blood, ED     Status: None   Collection Time: 03/02/22 12:53 PM  Result Value Ref Range   I-stat hCG, quantitative <5.0 <5 mIU/mL   Comment 3            Comment:   GEST. AGE      CONC.  (mIU/mL)   <=1 WEEK        5 - 50     2 WEEKS       50 - 500     3 WEEKS       100 - 10,000     4 WEEKS     1,000 - 30,000        FEMALE AND NON-PREGNANT FEMALE:     LESS THAN 5 mIU/mL   Urinalysis, Routine w reflex microscopic     Status: Abnormal   Collection Time: 03/02/22  1:31 PM  Result Value Ref Range   Color, Urine STRAW (A) YELLOW   APPearance CLEAR CLEAR   Specific Gravity, Urine 1.005 1.005 - 1.030   pH 6.0 5.0 - 8.0   Glucose, UA NEGATIVE NEGATIVE mg/dL   Hgb urine dipstick SMALL (A) NEGATIVE   Bilirubin Urine NEGATIVE NEGATIVE   Ketones, ur 20 (A) NEGATIVE mg/dL   Protein, ur NEGATIVE NEGATIVE mg/dL   Nitrite NEGATIVE NEGATIVE   Leukocytes,Ua NEGATIVE  NEGATIVE   RBC / HPF 0-5 0 - 5 RBC/hpf   Bacteria, UA RARE (A) NONE SEEN   Squamous Epithelial / LPF 0-5 0 - 5   Mucus PRESENT     Comment: Performed at [facility], [place] [address],  [place], [place] [address]  Rapid urine drug screen (hospital performed)     Status: Abnormal   Collection Time: 03/02/22  1:32 PM  Result Value Ref Range   Opiates NONE DETECTED NONE DETECTED   Cocaine NONE DETECTED NONE DETECTED   Benzodiazepines NONE DETECTED NONE DETECTED   Amphetamines NONE DETECTED NONE DETECTED   Tetrahydrocannabinol POSITIVE (A) NONE DETECTED   Barbiturates NONE DETECTED NONE DETECTED    Comment: (NOTE) DRUG SCREEN FOR MEDICAL PURPOSES ONLY.  IF CONFIRMATION IS NEEDED FOR ANY PURPOSE, NOTIFY LAB WITHIN 5 DAYS.  LOWEST DETECTABLE LIMITS FOR URINE DRUG SCREEN Drug Class                     Cutoff (ng/mL) Amphetamine and metabolites    1000 Barbiturate and metabolites    200 Benzodiazepine                 664 Tricyclics and metabolites     300 Opiates and metabolites        300 Cocaine and metabolites        300 THC                            50 Performed at [facility], [place] [address], [place], [place] [address]   Troponin I (High Sensitivity)     Status: None   Collection Time: 03/02/22  2:21 PM  Result Value Ref Range   Troponin I (High Sensitivity) 3 <18 ng/L    Comment: (NOTE) Elevated high sensitivity troponin I (hsTnI) values and significant  changes across serial measurements may suggest ACS but many other  chronic and acute conditions are known to elevate hsTnI results.  Refer to the "Links" section for chest pain algorithms and additional  guidance. Performed at [facility], [place] [address] [place], [place] [address]   HIV Antibody (routine testing w rflx)     Status: None   Collection Time: 03/03/22  4:14 AM  Result Value Ref Range   HIV Screen 4th Generation wRfx Non Reactive Non Reactive    Comment: Performed at [facility], [place] [address], [place], [place] [address]  Basic metabolic panel     Status: Abnormal   Collection Time: 03/03/22  4:14 AM  Result Value Ref Range   Sodium 141 135 - 145 mmol/L   Potassium 3.5  3.5 - 5.1 mmol/L   Chloride 108 98 - 111 mmol/L   CO2 26 22 - 32 mmol/L   Glucose, Bld 114 (H) 70 - 99 mg/dL    Comment: Glucose reference range applies only to samples taken after fasting for at least 8 hours.   BUN 8 6 - 20 mg/dL   Creatinine, Ser 0.62 0.44 - 1.00 mg/dL   Calcium 8.7 (L) 8.9 - 10.3 mg/dL   GFR, Estimated >60 >60 mL/min    Comment: (NOTE) Calculated using the CKD-EPI Creatinine Equation (2021)    Anion gap 7 5 - 15    Comment: Performed at [facility] [address] [place], [place] [address]  CBC     Status: None   Collection Time:  03/03/22  4:14 AM  Result Value Ref Range   WBC 7.4 4.0 - 10.5 K/uL   RBC 4.26 3.87 - 5.11 MIL/uL   Hemoglobin 14.3 12.0 - 15.0 g/dL   HCT 41.2 36.0 - 46.0 %   MCV 96.7 80.0 - 100.0 fL   MCH 33.6 26.0 - 34.0 pg   MCHC 34.7 30.0 - 36.0 g/dL   RDW 13.3 11.5 - 15.5 %   Platelets 330 150 - 400 K/uL   nRBC 0.0 0.0 - 0.2 %    Comment: Performed at [facility], [place] [address], [place], [place] [address]  CBG monitoring, ED     Status: Abnormal   Collection Time: 03/03/22  6:33 AM  Result Value Ref Range   Glucose-Capillary 101 (H) 70 - 99 mg/dL    Comment: Glucose reference range applies only to samples taken after fasting for at least 8 hours.   ECHOCARDIOGRAM COMPLETE  Result Date: 03/03/2022    ECHOCARDIOGRAM REPORT   Patient Name:   Alexis English Date of Exam: 03/03/2022 Medical Rec #:  [id number]         Height:       63.0 in Accession #:    [id number]        Weight:       120.0 lb Date of Birth:  [birth year]         BSA:          1.556 m Patient Age:    11 years          BP:           123/81 mmHg Patient Gender: F                 HR:           74 bpm. Exam Location:  Inpatient Procedure: 2D Echo, Cardiac Doppler and Color Doppler Indications:    Syncope  History:        Patient has no prior history of Echocardiogram examinations.  Sonographer:    Merrie Roof RDCS Referring Phys: [id number] [place]  1.  Left ventricular ejection fraction, by estimation, is 60 to 65%. The left ventricle has normal function. The left ventricle has no regional wall motion abnormalities. Left ventricular diastolic parameters were normal.  2. Right ventricular systolic function is normal. The right ventricular size is normal.  3. The mitral valve is abnormal. Trivial mitral valve regurgitation. No evidence of mitral stenosis.  4. The aortic valve is tricuspid. Aortic valve regurgitation is not visualized. No aortic stenosis is present.  5. The inferior vena cava is normal in size with greater than 50% respiratory variability, suggesting right atrial pressure of 3 mmHg. FINDINGS  Left Ventricle: Left ventricular ejection fraction, by estimation, is 60 to 65%. The left ventricle has normal function. The left ventricle has no regional wall motion abnormalities. The left ventricular internal cavity size was normal in size. There is  no left ventricular hypertrophy. Left ventricular diastolic parameters were normal. Right Ventricle: The right ventricular size is normal. No increase in right ventricular wall thickness. Right ventricular systolic function is normal. Left Atrium: Left atrial size was normal in size. Right Atrium: Right atrial size was normal in size. Pericardium: There is no evidence of pericardial effusion. Mitral Valve: The mitral valve is abnormal. There is mild thickening of the mitral valve leaflet(s). There is mild calcification of the mitral valve leaflet(s). Trivial mitral valve regurgitation. No evidence of mitral valve  stenosis. Tricuspid Valve: The tricuspid valve is normal in structure. Tricuspid valve regurgitation is trivial. No evidence of tricuspid stenosis. Aortic Valve: The aortic valve is tricuspid. Aortic valve regurgitation is not visualized. No aortic stenosis is present. Pulmonic Valve: The pulmonic valve was normal in structure. Pulmonic valve regurgitation is trivial. No evidence of pulmonic stenosis.  Aorta: The aortic root is normal in size and structure. Venous: The inferior vena cava is normal in size with greater than 50% respiratory variability, suggesting right atrial pressure of 3 mmHg. IAS/Shunts: The interatrial septum was not well visualized.  LEFT VENTRICLE PLAX 2D LVIDd:         4.10 cm   Diastology LVIDs:         2.70 cm   LV e' medial:    10.40 cm/s LV PW:         0.80 cm   LV E/e' medial:  6.2 LV IVS:        0.70 cm   LV e' lateral:   11.60 cm/s LVOT diam:     1.90 cm   LV E/e' lateral: 5.6 LV SV:         45 LV SV Index:   29 LVOT Area:     2.84 cm  RIGHT VENTRICLE RV Basal diam:  3.80 cm TAPSE (M-mode): 2.7 cm LEFT ATRIUM             Index        RIGHT ATRIUM           Index LA diam:        2.50 cm 1.61 cm/m   RA Area:     13.00 cm LA Vol (A2C):   54.0 ml 34.70 ml/m  RA Volume:   32.40 ml  20.82 ml/m LA Vol (A4C):   70.2 ml 45.11 ml/m LA Biplane Vol: 64.0 ml 41.12 ml/m  AORTIC VALVE LVOT Vmax:   82.80 cm/s LVOT Vmean:  54.500 cm/s LVOT VTI:    0.160 m  AORTA Ao Root diam: 3.30 cm Ao Asc diam:  3.50 cm MITRAL VALVE MV Area (PHT): 3.72 cm    SHUNTS MV Decel Time: 204 msec    Systemic VTI:  0.16 m MV E velocity: 65.00 cm/s  Systemic Diam: 1.90 cm MV A velocity: 75.50 cm/s MV E/A ratio:  0.86 Jenkins Rouge MD Electronically signed by Jenkins Rouge MD Signature Date/Time: 03/03/2022/12:21:20 PM    Final    MR Cervical Spine Wo Contrast  Result Date: 03/03/2022 CLINICAL DATA:  Initial evaluation for acute neck pain. EXAM: MRI CERVICAL SPINE WITHOUT CONTRAST TECHNIQUE: Multiplanar, multisequence MR imaging of the cervical spine was performed. No intravenous contrast was administered. COMPARISON:  Prior CT from earlier same day as well as prior MRI from 04/09/2012. FINDINGS: Alignment: Straightening with mild reversal of the normal cervical lordosis. Trace anterolisthesis of C2 on C3, with trace retrolisthesis of C3 on C4. 2 mm retrolisthesis of C5 on C6, with trace retrolisthesis of C6 on C7.  Vertebrae: Prior ACDF at C4-5 with solid arthrodesis. Vertebral body height maintained. Bone marrow signal intensity within normal limits. No discrete or worrisome osseous lesions. Discogenic reactive endplate change present about the C3-4 interspace. No other abnormal marrow edema. Cord: Suspected subtle patchy cord signal abnormality at the level of C3-4, likely compressive myelomalacia of due to stenosis at this level (series 5, image 10). Signal intensity within the visualized cord otherwise within normal limits. Posterior Fossa, vertebral arteries, paraspinal tissues: Unremarkable. Disc levels: C2-C3: Small  left paracentral disc protrusion indents the ventral thecal sac. Mild left-sided facet hypertrophy. No significant spinal stenosis. Foramina remain patent. C3-C4: Degenerative intervertebral disc space narrowing. Broad-based central disc protrusion indents and effaces the ventral thecal sac, contacting and flattening the cord at this level. Associated subtle patchy cord signal changes suspicious for myelomalacia. Severe spinal stenosis with the thecal sac measuring 4-5 mm in AP diameter at its most narrow point. Severe bilateral C4 foraminal narrowing. C4-C5:  Prior fusion.  No residual canal or foraminal stenosis. C5-C6: Broad-based posterior disc osteophyte complex flattens and partially faces the ventral thecal sac. Mild cord flattening without cord signal changes. Resultant moderate to severe spinal stenosis. Left worse than right uncovertebral spurring with resultant severe left worse than right C6 foraminal stenosis. C6-C7: Small left paracentral disc protrusion indents the ventral thecal sac (series 8, image 34). Mild spinal stenosis. Superimposed uncovertebral spurring with resultant mild bilateral C7 foraminal stenosis. C7-T1:  Unremarkable. Visualized upper thoracic spine demonstrates no significant finding. IMPRESSION: 1. Broad-based central disc protrusion at C3-4 with resultant severe spinal  stenosis and flattening of the cervical spinal cord. Associated subtle patchy cord signal abnormality suspicious for associated compressive myelomalacia. 2. Broad-based posterior disc osteophyte complex at C5-6 with resultant moderate to severe spinal stenosis, with severe left worse than right C6 foraminal narrowing. 3. Small left paracentral disc protrusion at C6-7 with resultant mild canal and bilateral C7 foraminal stenosis. 4. Prior ACDF at C4-5 without residual stenosis. Electronically Signed   By: Jeannine Boga M.D.   On: 03/03/2022 00:15   MR BRAIN WO CONTRAST  Result Date: 03/03/2022 CLINICAL DATA:  Initial evaluation for neuro deficit, stroke suspected. EXAM: MRI HEAD WITHOUT CONTRAST TECHNIQUE: Multiplanar, multiecho pulse sequences of the brain and surrounding structures were obtained without intravenous contrast. COMPARISON:  Prior CT from earlier the same day. FINDINGS: Brain: Cerebral volume within normal limits for patient age. No focal parenchymal signal abnormality identified. No abnormal foci of restricted diffusion to suggest acute or subacute ischemia. Gray-white matter differentiation well maintained. No encephalomalacia to suggest chronic infarction. No foci of susceptibility artifact to suggest acute or chronic intracranial hemorrhage. No mass lesion, midline shift or mass effect. No hydrocephalus. No extra-axial fluid collection. Pituitary gland and suprasellar region are normal. Midline structures intact and normal. Vascular: Major intracranial vascular flow voids well maintained. Skull and upper cervical spine: Craniocervical junction within normal limits. Postsurgical changes partially visualize within the upper cervical spine. Bone marrow signal intensity normal. No scalp soft tissue abnormality. Sinuses/Orbits: Globes and orbital soft tissues within normal limits. Paranasal sinuses are clear.  No significant mastoid effusion. Other: None. IMPRESSION: Normal brain MRI. No  acute intracranial abnormality identified. Electronically Signed   By: Jeannine Boga M.D.   On: 03/03/2022 00:08   CT Head Wo Contrast  Result Date: 03/02/2022 CLINICAL DATA:  Syncope/presyncope, cerebrovascular cause suspected; Neck trauma, dangerous injury mechanism (Age 48-64y) EXAM: CT HEAD WITHOUT CONTRAST CT CERVICAL SPINE WITHOUT CONTRAST TECHNIQUE: Multidetector CT imaging of the head and cervical spine was performed following the standard protocol without intravenous contrast. Multiplanar CT image reconstructions of the cervical spine were also generated. RADIATION DOSE REDUCTION: This exam was performed according to the departmental dose-optimization program which includes automated exposure control, adjustment of the mA and/or kV according to patient size and/or use of iterative reconstruction technique. COMPARISON:  None Available. FINDINGS: CT HEAD FINDINGS Brain: No evidence of acute infarction, hemorrhage, hydrocephalus, extra-axial collection or mass lesion/mass effect. Vascular: No hyperdense vessel or unexpected calcification.  Skull: Normal. Negative for fracture or focal lesion. Sinuses/Orbits: No acute finding. Other: Negative for scalp hematoma. CT CERVICAL SPINE FINDINGS Alignment: Facet joints are aligned without dislocation or traumatic listhesis. Dens and lateral masses are aligned. Straightening of the cervical lordosis. Skull base and vertebrae: Prior C4-5 ACDF with solid ankylosis. No acute fracture. No pathologic bone process identified. Soft tissues and spinal canal: No prevertebral fluid or swelling. No visible canal hematoma. Disc levels:  Degenerative disc disease at C3-4 and C5-6. Upper chest: Negative. Other: None. IMPRESSION: 1. No acute intracranial abnormality. 2. No acute fracture or subluxation of the cervical spine. 3. Prior C4-5 ACDF with solid ankylosis. Adjacent segment disease at C3-4 and C5-6. Electronically Signed   By: Davina Poke D.O.   On: 03/02/2022  13:01   CT Cervical Spine Wo Contrast  Result Date: 03/02/2022 CLINICAL DATA:  Syncope/presyncope, cerebrovascular cause suspected; Neck trauma, dangerous injury mechanism (Age 27-64y) EXAM: CT HEAD WITHOUT CONTRAST CT CERVICAL SPINE WITHOUT CONTRAST TECHNIQUE: Multidetector CT imaging of the head and cervical spine was performed following the standard protocol without intravenous contrast. Multiplanar CT image reconstructions of the cervical spine were also generated. RADIATION DOSE REDUCTION: This exam was performed according to the departmental dose-optimization program which includes automated exposure control, adjustment of the mA and/or kV according to patient size and/or use of iterative reconstruction technique. COMPARISON:  None Available. FINDINGS: CT HEAD FINDINGS Brain: No evidence of acute infarction, hemorrhage, hydrocephalus, extra-axial collection or mass lesion/mass effect. Vascular: No hyperdense vessel or unexpected calcification. Skull: Normal. Negative for fracture or focal lesion. Sinuses/Orbits: No acute finding. Other: Negative for scalp hematoma. CT CERVICAL SPINE FINDINGS Alignment: Facet joints are aligned without dislocation or traumatic listhesis. Dens and lateral masses are aligned. Straightening of the cervical lordosis. Skull base and vertebrae: Prior C4-5 ACDF with solid ankylosis. No acute fracture. No pathologic bone process identified. Soft tissues and spinal canal: No prevertebral fluid or swelling. No visible canal hematoma. Disc levels:  Degenerative disc disease at C3-4 and C5-6. Upper chest: Negative. Other: None. IMPRESSION: 1. No acute intracranial abnormality. 2. No acute fracture or subluxation of the cervical spine. 3. Prior C4-5 ACDF with solid ankylosis. Adjacent segment disease at C3-4 and C5-6. Electronically Signed   By: Davina Poke D.O.   On: 03/02/2022 13:01    Pending Labs Unresulted Labs (From admission, onward)     Start     Ordered   03/02/22  1923  Vitamin B1  Once,   URGENT        03/02/22 1925            Vitals/Pain Today's Vitals   03/03/22 1245 03/03/22 1300 03/03/22 1315 03/03/22 1330  BP: 98/77 119/78 106/83 106/74  Pulse: (!) 104 (!) 102 98 92  Resp: '17 16 14 13  '$ Temp:      TempSrc:      SpO2: 92% 93% 95% 95%  Weight:      Height:      PainSc:        Isolation Precautions No active isolations  Medications Medications  thiamine (B-1) injection 100 mg (0 mg Intravenous Hold 03/03/22 0920)  HYDROmorphone (DILAUDID) injection 1 mg (1 mg Intravenous Given 03/03/22 0628)  venlafaxine XR (EFFEXOR-XR) 24 hr capsule 75 mg (75 mg Oral Given 03/03/22 0933)  amLODipine (NORVASC) tablet 10 mg (10 mg Oral Given 03/03/22 0933)  sodium chloride flush (NS) 0.9 % injection 3 mL (3 mLs Intravenous Given 03/03/22 0938)  0.9 %  sodium  chloride infusion ( Intravenous New Bag/Given 03/03/22 0434)  acetaminophen (TYLENOL) tablet 650 mg (has no administration in time range)    Or  acetaminophen (TYLENOL) suppository 650 mg (has no administration in time range)  oxyCODONE (Oxy IR/ROXICODONE) immediate release tablet 5 mg (5 mg Oral Given 03/03/22 0427)  senna-docusate (Senokot-S) tablet 1 tablet (has no administration in time range)  ondansetron (ZOFRAN) tablet 4 mg (has no administration in time range)    Or  ondansetron (ZOFRAN) injection 4 mg (has no administration in time range)  LORazepam (ATIVAN) tablet 1-4 mg (has no administration in time range)    Or  LORazepam (ATIVAN) injection 1-4 mg (has no administration in time range)  thiamine tablet 100 mg (100 mg Oral Given 03/03/22 0933)    Or  thiamine (B-1) injection 100 mg ( Intravenous See Alternative 6/54/65 0354)  folic acid (FOLVITE) tablet 1 mg (1 mg Oral Given 03/03/22 0934)  multivitamin with minerals tablet 1 tablet (1 tablet Oral Given 03/03/22 0933)  LORazepam (ATIVAN) injection 0-4 mg (2 mg Intravenous Given 03/03/22 1221)    Followed by  LORazepam (ATIVAN) injection  0-4 mg (has no administration in time range)  dexamethasone (DECADRON) injection 4 mg (4 mg Intravenous Given 03/03/22 1217)  gabapentin (NEURONTIN) capsule 300 mg (300 mg Oral Given 03/03/22 0933)  sodium chloride 0.9 % bolus 1,000 mL (0 mLs Intravenous Stopped 03/02/22 2106)  fentaNYL (SUBLIMAZE) injection 50 mcg (50 mcg Intravenous Given 03/02/22 1957)  LORazepam (ATIVAN) injection 1 mg (1 mg Intravenous Given 03/02/22 2205)  morphine (PF) 4 MG/ML injection 4 mg (4 mg Intravenous Given 03/02/22 2109)    Mobility walks Moderate fall risk   Focused Assessments Neuro Assessment Handoff:  Swallow screen pass? Yes          Neuro Assessment:   Neuro Checks:      Last Documented NIHSS Modified Score:   Has TPA been given? No If patient is a Neuro Trauma and patient is going to OR before floor call report to [place] nurse: [phone] or [phone]   R Recommendations: See Admitting Provider Note

## 2022-03-03 NOTE — ED Notes (Signed)
Dr. Opyd at bedside  

## 2022-03-03 NOTE — ED Notes (Signed)
This patient is requesting medication for muscle spasms and a headache. Dr. Myna Hidalgo informed via epic secure chat.

## 2022-03-03 NOTE — H&P (Addendum)
History and Physical    VICKI CHAFFIN ERX:[id number] DOB: 06-Jan-1966 DOA: 03/02/2022  PCP: Debbrah Alar, NP   Patient coming from: Home   Chief Complaint: Passed out twice, pain across upper back and b/l shoulders and arms, arm and leg weakness   HPI: KIAHNA BANGHART is a pleasant 56 y.o. female with medical history significant for hypertension, depression, anxiety, and C4-5 fusion in [place] more than 20 years ago, now presenting to the emergency department after 2 syncopal episodes.  Patient reports that she was in her usual state and having an uneventful day on 03/01/2022 when at approximately 5:30 PM she became acutely lightheaded and lost consciousness shortly after standing.  She began to experience severe pain across her upper back, bilateral shoulders, and bilateral arms after this, and reports that she had a a lot of difficulty getting up due to weakness involving the bilateral arms and legs.  She was able to crawl to her bed, had difficulty sleeping due to the pain, and then had a another transient loss of consciousness at roughly 3 AM the next morning while trying to get to the bathroom.  She has been unable to stand without assistance since then due to weakness.  She denies any fevers, chills, chest pain, or palpitations.  ED Course: Upon arrival to the ED, patient is found to be afebrile, saturating well on room air, and with stable blood pressure.  EKG features sinus rhythm with RAD.  Head CT is negative for acute intracranial abnormality and CT of the cervical spine is negative for acute fracture or subluxation.  She had a normal brain MRI and MRI of the cervical spine which is concerning for severe stenosis at C3-4 and C5-6.  She was treated with IV fluids, fentanyl, morphine, and thiamine in the ED.  Review of Systems:  All other systems reviewed and apart from HPI, are negative.  Past Medical History:  Diagnosis Date   Anxiety    Depression    History of  kidney stones    Hypertension    Pneumonia 08/2010   Pulmonary nodule 08/2010   per CT   Ruptured disc, cervical    C4-5; cervical fusion   Smoker     Past Surgical History:  Procedure Laterality Date   ABDOMINAL HERNIA REPAIR     as a child   CERVICAL FUSION  2000   CESAREAN SECTION     x2   CYSTECTOMY     from back x 2   TONSILLECTOMY      Social History:   reports that she has been smoking cigarettes. She has a 13.00 pack-year smoking history. She has never used smokeless tobacco. She reports current alcohol use of about 14.0 standard drinks of alcohol per week. She reports that she does not currently use drugs.  Allergies  Allergen Reactions   Hydrocodone Itching   Bupropion Hcl     REACTION: hives   Buspar [Buspirone] Other (See Comments)    Nausea, dizziness, weakness, muscle cramps   Chantix [Varenicline]     dizziness   Propoxyphene Hives   Propoxyphene N-Acetaminophen     REACTION: hives   Zoloft [Sertraline]     hives    Family History  Problem Relation Age of Onset   Heart attack Mother    Arthritis Mother    Anxiety disorder Mother    Heart attack Father    Colon cancer Father 29       father having chemo now for  colon cancer      Prior to Admission medications   Medication Sig Start Date End Date Taking? Authorizing Provider  amLODipine (NORVASC) 10 MG tablet TAKE 1 TABLET BY MOUTH EVERY DAY Patient taking differently: Take 10 mg by mouth daily. 01/13/22  Yes Saguier, Percell Miller, PA-C  gabapentin (NEURONTIN) 100 MG capsule 1 cap PO TID prn neck pain Patient taking differently: Take 100 mg by mouth 3 (three) times daily as needed. 1 cap PO TID prn neck pain 03/18/21  Yes Debbrah Alar, NP  hydrochlorothiazide (HYDRODIURIL) 25 MG tablet TAKE 1 TABLET (25 MG TOTAL) BY MOUTH DAILY. Patient taking differently: Take 25 mg by mouth daily. 12/30/21  Yes Debbrah Alar, NP  potassium chloride SA (KLOR-CON M20) 20 MEQ tablet TAKE 1 AND 1/2 TABLETS (30  MEQ TOTAL) BY MOUTH DAILY. Patient taking differently: Take 30 mEq by mouth daily. TAKE 1 AND 1/2 TABLETS (30 MEQ TOTAL) BY MOUTH DAILY. 10/10/21  Yes Debbrah Alar, NP  venlafaxine XR (EFFEXOR-XR) 75 MG 24 hr capsule TAKE 1 CAPSULE BY MOUTH DAILY WITH BREAKFAST. Patient taking differently: Take 75 mg by mouth daily with breakfast. 01/04/22  Yes Saguier, Iris Pert    Physical Exam: Vitals:   03/02/22 2015 03/02/22 2030 03/02/22 2115 03/03/22 0150  BP:    123/72  Pulse: 78 76 75 76  Resp: '20 11 17 18  '$ Temp:      TempSrc:      SpO2: 96% 96% 98% 96%  Weight:      Height:        Constitutional: NAD, calm  Eyes: PERTLA, lids and conjunctivae normal ENMT: Mucous membranes are moist. Posterior pharynx clear of any exudate or lesions.   Neck: supple, no masses  Respiratory:  no wheezing, no crackles. No accessory muscle use.  Cardiovascular: S1 & S2 heard, regular rate and rhythm. No extremity edema.  Abdomen: No distension, no tenderness, soft. Bowel sounds active.  Musculoskeletal: no clubbing / cyanosis. No joint deformity upper and lower extremities.   Skin: no significant rashes, lesions, ulcers. Warm, dry, well-perfused. Neurologic: CN 2-12 grossly intact. Sensation to light touch diminished in b/l UEs. Strength 3-4/5 in proximal UEs b/l. Alert and oriented to person, place, and situation.  Psychiatric: Pleasant. Cooperative.    Labs and Imaging on Admission: I have personally reviewed following labs and imaging studies  CBC: Recent Labs  Lab 03/02/22 1239  WBC 13.3*  HGB 15.4*  HCT 43.4  MCV 96.0  PLT 409   Basic Metabolic Panel: Recent Labs  Lab 03/02/22 1239  NA 137  K 3.8  CL 103  CO2 22  GLUCOSE 91  BUN 9  CREATININE 0.59  CALCIUM 9.5   GFR: Estimated Creatinine Clearance: 66.5 mL/min (by C-G formula based on SCr of 0.59 mg/dL). Liver Function Tests: Recent Labs  Lab 03/02/22 1239  AST 25  ALT 20  ALKPHOS 31*  BILITOT 1.0  PROT 7.2   ALBUMIN 4.6   No results for input(s): "LIPASE", "AMYLASE" in the last 168 hours. No results for input(s): "AMMONIA" in the last 168 hours. Coagulation Profile: No results for input(s): "INR", "PROTIME" in the last 168 hours. Cardiac Enzymes: No results for input(s): "CKTOTAL", "CKMB", "CKMBINDEX", "TROPONINI" in the last 168 hours. BNP (last 3 results) No results for input(s): "PROBNP" in the last 8760 hours. HbA1C: No results for input(s): "HGBA1C" in the last 72 hours. CBG: No results for input(s): "GLUCAP" in the last 168 hours. Lipid Profile: No results for input(s): "CHOL", "  HDL", "LDLCALC", "TRIG", "CHOLHDL", "LDLDIRECT" in the last 72 hours. Thyroid Function Tests: No results for input(s): "TSH", "T4TOTAL", "FREET4", "T3FREE", "THYROIDAB" in the last 72 hours. Anemia Panel: No results for input(s): "VITAMINB12", "FOLATE", "FERRITIN", "TIBC", "IRON", "RETICCTPCT" in the last 72 hours. Urine analysis:    Component Value Date/Time   COLORURINE STRAW (A) 03/02/2022 1331   APPEARANCEUR CLEAR 03/02/2022 1331   LABSPEC 1.005 03/02/2022 1331   PHURINE 6.0 03/02/2022 1331   GLUCOSEU NEGATIVE 03/02/2022 1331   GLUCOSEU NEGATIVE 03/13/2016 1048   HGBUR SMALL (A) 03/02/2022 1331   BILIRUBINUR NEGATIVE 03/02/2022 1331   BILIRUBINUR neg 02/22/2016 1450   KETONESUR 20 (A) 03/02/2022 1331   PROTEINUR NEGATIVE 03/02/2022 1331   UROBILINOGEN 0.2 03/13/2016 1048   NITRITE NEGATIVE 03/02/2022 1331   LEUKOCYTESUR NEGATIVE 03/02/2022 1331   Sepsis Labs: '@LABRCNTIP'$ (procalcitonin:4,lacticidven:4) )No results found for this or any previous visit (from the past 240 hour(s)).   Radiological Exams on Admission: MR Cervical Spine Wo Contrast  Result Date: 03/03/2022 CLINICAL DATA:  Initial evaluation for acute neck pain. EXAM: MRI CERVICAL SPINE WITHOUT CONTRAST TECHNIQUE: Multiplanar, multisequence MR imaging of the cervical spine was performed. No intravenous contrast was administered.  COMPARISON:  Prior CT from earlier same day as well as prior MRI from 04/09/2012. FINDINGS: Alignment: Straightening with mild reversal of the normal cervical lordosis. Trace anterolisthesis of C2 on C3, with trace retrolisthesis of C3 on C4. 2 mm retrolisthesis of C5 on C6, with trace retrolisthesis of C6 on C7. Vertebrae: Prior ACDF at C4-5 with solid arthrodesis. Vertebral body height maintained. Bone marrow signal intensity within normal limits. No discrete or worrisome osseous lesions. Discogenic reactive endplate change present about the C3-4 interspace. No other abnormal marrow edema. Cord: Suspected subtle patchy cord signal abnormality at the level of C3-4, likely compressive myelomalacia of due to stenosis at this level (series 5, image 10). Signal intensity within the visualized cord otherwise within normal limits. Posterior Fossa, vertebral arteries, paraspinal tissues: Unremarkable. Disc levels: C2-C3: Small left paracentral disc protrusion indents the ventral thecal sac. Mild left-sided facet hypertrophy. No significant spinal stenosis. Foramina remain patent. C3-C4: Degenerative intervertebral disc space narrowing. Broad-based central disc protrusion indents and effaces the ventral thecal sac, contacting and flattening the cord at this level. Associated subtle patchy cord signal changes suspicious for myelomalacia. Severe spinal stenosis with the thecal sac measuring 4-5 mm in AP diameter at its most narrow point. Severe bilateral C4 foraminal narrowing. C4-C5:  Prior fusion.  No residual canal or foraminal stenosis. C5-C6: Broad-based posterior disc osteophyte complex flattens and partially faces the ventral thecal sac. Mild cord flattening without cord signal changes. Resultant moderate to severe spinal stenosis. Left worse than right uncovertebral spurring with resultant severe left worse than right C6 foraminal stenosis. C6-C7: Small left paracentral disc protrusion indents the ventral thecal sac  (series 8, image 34). Mild spinal stenosis. Superimposed uncovertebral spurring with resultant mild bilateral C7 foraminal stenosis. C7-T1:  Unremarkable. Visualized upper thoracic spine demonstrates no significant finding. IMPRESSION: 1. Broad-based central disc protrusion at C3-4 with resultant severe spinal stenosis and flattening of the cervical spinal cord. Associated subtle patchy cord signal abnormality suspicious for associated compressive myelomalacia. 2. Broad-based posterior disc osteophyte complex at C5-6 with resultant moderate to severe spinal stenosis, with severe left worse than right C6 foraminal narrowing. 3. Small left paracentral disc protrusion at C6-7 with resultant mild canal and bilateral C7 foraminal stenosis. 4. Prior ACDF at C4-5 without residual stenosis. Electronically Signed   By:  Jeannine Boga M.D.   On: 03/03/2022 00:15   MR BRAIN WO CONTRAST  Result Date: 03/03/2022 CLINICAL DATA:  Initial evaluation for neuro deficit, stroke suspected. EXAM: MRI HEAD WITHOUT CONTRAST TECHNIQUE: Multiplanar, multiecho pulse sequences of the brain and surrounding structures were obtained without intravenous contrast. COMPARISON:  Prior CT from earlier the same day. FINDINGS: Brain: Cerebral volume within normal limits for patient age. No focal parenchymal signal abnormality identified. No abnormal foci of restricted diffusion to suggest acute or subacute ischemia. Gray-white matter differentiation well maintained. No encephalomalacia to suggest chronic infarction. No foci of susceptibility artifact to suggest acute or chronic intracranial hemorrhage. No mass lesion, midline shift or mass effect. No hydrocephalus. No extra-axial fluid collection. Pituitary gland and suprasellar region are normal. Midline structures intact and normal. Vascular: Major intracranial vascular flow voids well maintained. Skull and upper cervical spine: Craniocervical junction within normal limits. Postsurgical  changes partially visualize within the upper cervical spine. Bone marrow signal intensity normal. No scalp soft tissue abnormality. Sinuses/Orbits: Globes and orbital soft tissues within normal limits. Paranasal sinuses are clear.  No significant mastoid effusion. Other: None. IMPRESSION: Normal brain MRI. No acute intracranial abnormality identified. Electronically Signed   By: Jeannine Boga M.D.   On: 03/03/2022 00:08   CT Head Wo Contrast  Result Date: 03/02/2022 CLINICAL DATA:  Syncope/presyncope, cerebrovascular cause suspected; Neck trauma, dangerous injury mechanism (Age 69-64y) EXAM: CT HEAD WITHOUT CONTRAST CT CERVICAL SPINE WITHOUT CONTRAST TECHNIQUE: Multidetector CT imaging of the head and cervical spine was performed following the standard protocol without intravenous contrast. Multiplanar CT image reconstructions of the cervical spine were also generated. RADIATION DOSE REDUCTION: This exam was performed according to the departmental dose-optimization program which includes automated exposure control, adjustment of the mA and/or kV according to patient size and/or use of iterative reconstruction technique. COMPARISON:  None Available. FINDINGS: CT HEAD FINDINGS Brain: No evidence of acute infarction, hemorrhage, hydrocephalus, extra-axial collection or mass lesion/mass effect. Vascular: No hyperdense vessel or unexpected calcification. Skull: Normal. Negative for fracture or focal lesion. Sinuses/Orbits: No acute finding. Other: Negative for scalp hematoma. CT CERVICAL SPINE FINDINGS Alignment: Facet joints are aligned without dislocation or traumatic listhesis. Dens and lateral masses are aligned. Straightening of the cervical lordosis. Skull base and vertebrae: Prior C4-5 ACDF with solid ankylosis. No acute fracture. No pathologic bone process identified. Soft tissues and spinal canal: No prevertebral fluid or swelling. No visible canal hematoma. Disc levels:  Degenerative disc disease at  C3-4 and C5-6. Upper chest: Negative. Other: None. IMPRESSION: 1. No acute intracranial abnormality. 2. No acute fracture or subluxation of the cervical spine. 3. Prior C4-5 ACDF with solid ankylosis. Adjacent segment disease at C3-4 and C5-6. Electronically Signed   By: Davina Poke D.O.   On: 03/02/2022 13:01   CT Cervical Spine Wo Contrast  Result Date: 03/02/2022 CLINICAL DATA:  Syncope/presyncope, cerebrovascular cause suspected; Neck trauma, dangerous injury mechanism (Age 87-64y) EXAM: CT HEAD WITHOUT CONTRAST CT CERVICAL SPINE WITHOUT CONTRAST TECHNIQUE: Multidetector CT imaging of the head and cervical spine was performed following the standard protocol without intravenous contrast. Multiplanar CT image reconstructions of the cervical spine were also generated. RADIATION DOSE REDUCTION: This exam was performed according to the departmental dose-optimization program which includes automated exposure control, adjustment of the mA and/or kV according to patient size and/or use of iterative reconstruction technique. COMPARISON:  None Available. FINDINGS: CT HEAD FINDINGS Brain: No evidence of acute infarction, hemorrhage, hydrocephalus, extra-axial collection or mass lesion/mass effect. Vascular:  No hyperdense vessel or unexpected calcification. Skull: Normal. Negative for fracture or focal lesion. Sinuses/Orbits: No acute finding. Other: Negative for scalp hematoma. CT CERVICAL SPINE FINDINGS Alignment: Facet joints are aligned without dislocation or traumatic listhesis. Dens and lateral masses are aligned. Straightening of the cervical lordosis. Skull base and vertebrae: Prior C4-5 ACDF with solid ankylosis. No acute fracture. No pathologic bone process identified. Soft tissues and spinal canal: No prevertebral fluid or swelling. No visible canal hematoma. Disc levels:  Degenerative disc disease at C3-4 and C5-6. Upper chest: Negative. Other: None. IMPRESSION: 1. No acute intracranial abnormality. 2.  No acute fracture or subluxation of the cervical spine. 3. Prior C4-5 ACDF with solid ankylosis. Adjacent segment disease at C3-4 and C5-6. Electronically Signed   By: Davina Poke D.O.   On: 03/02/2022 13:01    EKG: Independently reviewed. SR, RAD, QTc 440 ms.   Assessment/Plan   1. Syncope  - Presents after 2 syncopal episodes that occurred shortly after standing - No arrhythmia, block, or QT prolongation on EKG in ED  - Continue cardiac monitoring, check orthostatic vitals, check echocardiogram    2. Cervical spinal stenosis  - Presents with severe pain across upper back, shoulders, and arms with b/l UE and LE weakness after syncope with fall the evening of 6/14  - MRI concerning for severe stenosis at C3-4 and C5-6  - Appreciate Dr. Reatha Armour of neurosurgery reviewing images; he plans to see her in consultation   3. Hypertension  - Continue Norvasc    4. Depression, anxiety  - Continue Effexor    5. Alcohol dependence  - Drinks daily, usually 3 glasses of wine, sometimes more  - Denies hx of withdrawal  - Monitor with CIWA, use Ativan if needed, supplement vitamins     DVT prophylaxis: SCDs  Code Status: Full  Level of Care: Level of care: Telemetry Medical Family Communication: Friend at bedside  Disposition Plan:  Patient is from: Home  Anticipated d/c is to: TBD Anticipated d/c date is: Possibly as early as 6/17  Patient currently: Pending cardiac monitoring, echo, neurosurgery consultation  Consults called: Neurosurgery   Admission status: Observation     Vianne Bulls, MD [facility]  03/03/2022, 2:58 AM

## 2022-03-03 NOTE — Consult Note (Signed)
   Providing Compassionate, Quality Care - Together  Neurosurgery Consult  Referring physician: Dr. Myna Hidalgo Reason for referral: Cervical stenosis  Chief Complaint: Syncope, fall, bilateral upper extremity weakness and pain  History of Present Illness: This is a 56 year old female with a history of a C4-5 ACDF many years ago in [place], that has noted chronic neck pain for many years.  She had a syncopal event that led to a fall and hitting her head multiple times this week.  Since then she has had severe bilateral neck pain radiating into her shoulders down her arms that is described as a "severe rug burn".  She states she has some difficulty with standing and walking independently due to her severe pain and slight weakness.  She denies any bowel or bladder changes.  She does state her syncopal episode may be related to dehydration, she had had 3 glasses of wine at that time and not eaten anything all day.  Medications: I have reviewed the patient's current medications. Allergies: No Known Allergies  History reviewed. No pertinent family history. Social History:  has no history on file for tobacco use, alcohol use, and drug use.  ROS: All pertinent positives are listed in HPI above  Physical Exam:  Vital signs in last 24 hours: Temp:  [98 F (36.7 C)-98.3 F (36.8 C)] 98 F (36.7 C) (07/25 1814) Pulse Rate:  [58-128] 65 (07/26 0746) Resp:  [11-18] 14 (07/26 0217) BP: (138-182)/(65-125) 153/88 (07/26 0700) SpO2:  [91 %-98 %] 96 % (07/26 0746) PE: Awake alert oriented x3, mild distress due to pain PERRLA Speech fluent and appropriate Bilateral upper/lower extremities 4+/5 throughout, except deltoid, 2/5 bilaterally-somewhat slightly limited due to pain, causes her severe pain to lift her arms above her head. Positive Hoffmann's, brisk bilaterally Positive clonus bilateral lower extremities Hyperreflexive throughout bilateral upper and lower extremities Sensory intact to light  touch throughout   Impression/Assessment:  56 year old female with  C3-4, C5-6 severe stenosis with myelopathy and central cord syndrome   Plan:  -MRI brain and C-spine reviewed.  Severe stenosis at C3-4 with chronic evidence of myelomalacia.  There is moderate to severe stenosis at C5-6 due to disc osteophyte complex without cord signal change. -I discussed these findings with the patient, her symptomatology is consistent with central cord syndrome.  I will add Decadron 4 mg every 6 and gabapentin for pain control. -I discussed with the patient that likely she will need surgical intervention in the form of an ACDF C3-4, C5-6 with removal of hardware at C4-5.  I answered all of her questions.  We discussed possible surgical intervention Monday or Tuesday pending her cardiac work-up for syncope.   Thank you for allowing me to participate in this patient's care.  Please do not hesitate to call with questions or concerns.   Elwin Sleight, [facility] Cell: [phone]

## 2022-03-03 NOTE — Progress Notes (Signed)
  Echocardiogram 2D Echocardiogram has been performed.  Merrie Roof F 03/03/2022, 11:45 AM

## 2022-03-03 NOTE — Progress Notes (Addendum)
PROGRESS NOTE    Alexis English  FMB:[id number] DOB: [birth year] DOA: 03/02/2022  PCP: Debbrah Alar, NP   Brief Narrative:  This 56 y.o. female with PMH significant for hypertension, depression, anxiety, and C4-5 fusion in [place] more than 20 years ago, presented in the ED after 2 syncopal episodes.  Patient reports she was in her usual state then suddenly she became acutely lightheaded and lost consciousness shortly after standing.  She began to experience severe pain in upper back both shoulders and both arms and has difficulty getting up due to weakness involving arms and legs.  She was able to crawl to her bed but had difficulty sleeping due to the pain.  Work-up in the ED CT head negative for acute intracranial abnormality.  CT C-spine negative for subluxation or fracture.  MRI C.Spine showed severe stenosis at C3 and C4 and C5 and C6.  Patient is admitted for further evaluation.  Patient was seen by neurosurgery,  recommended Neurosurgical intervention on Monday.  Patient started on Decadron 4 mg every 6 hours.   Assessment & Plan:   Principal Problem:   Syncope Active Problems:   Depression with anxiety   HTN (hypertension)   Cervical spinal stenosis  Syncope: Patient presented with 2 syncopal episodes that occurred shortly after standing. EKGs : No arrhythmia , block or QT prolongation. Continue cardiac monitoring.  Check orthostatic vitals Echocardiogram completed, report is pending  Cervical spinal stenosis: Patient presented with severe pain across upper back, in shoulder and arms with bilateral upper and lower extremity weakness after syncopal episode. MRI concerning for severe stenosis at C3-C4 and C5- C6 Neurosurgery consult appreciated Dr. Reatha Armour has reviewed the images and recommended neurosurgical intervention on Monday.  Patient has symptomatology consistent with central cord syndrome, recommended Decadron 4 mg every 6 hours and gabapentin for pain  control.  Essential hypertension: Continue Norvasc 10 mg daily  Depression/anxiety: Continue Effexor 75 mg dialy  Alcohol Dependence Patient drinks daily usually 3 glasses of wine Denies any history of withdrawal. Monitor with CIWA protocol with Ativan as needed  DVT prophylaxis: SCDs Code Status: Full code Family Communication: Friend at bedside Disposition Plan:   Status is: Inpatient Remains inpatient appropriate because:   Admitted for syncopal episode twice found to have severe cervical spinal stenosis.  Neurosurgery recommended, neurosurgical intervention on Monday.    Consultants:  Neurosurgery  Procedures: Echocardiogram Antimicrobials: None  Subjective: Patient was seen and examined at bedside.  Overnight events noted.   Patient reports having severe pain in the arms and legs, reports she is not able to stand up.  Objective: Vitals:   03/03/22 1300 03/03/22 1315 03/03/22 1330 03/03/22 1536  BP: 119/78 106/83 106/74 115/71  Pulse: (!) 102 98 92 80  Resp: '16 14 13 18  '$ Temp:    98.6 F (37 C)  TempSrc:    Oral  SpO2: 93% 95% 95% 96%  Weight:      Height:       No intake or output data in the 24 hours ending 03/03/22 1537 Filed Weights   03/02/22 1220  Weight: 54.4 kg    Examination:  General exam: Appears comfortable, not in any acute distress. Respiratory system: CTA bilaterally, no wheezing, no crackles, normal respiratory effort. Cardiovascular system: S1-S2 heard, regular rate and rhythm, no murmur. Gastrointestinal system: Abdomen is soft, non tender, non distended, BS+ Central nervous system: Alert and oriented x 3.  Power in both upper and lower extremities 4/5 Decreased sensation to light  touch. Extremities: No edema, no cyanosis, no clubbing. Skin: No rashes, lesions or ulcers Psychiatry: Judgement and insight appear normal. Mood & affect appropriate.     Data Reviewed: I have personally reviewed following labs and imaging  studies  CBC: Recent Labs  Lab 03/02/22 1239 03/03/22 0414  WBC 13.3* 7.4  HGB 15.4* 14.3  HCT 43.4 41.2  MCV 96.0 96.7  PLT 386 998   Basic Metabolic Panel: Recent Labs  Lab 03/02/22 1239 03/03/22 0414  NA 137 141  K 3.8 3.5  CL 103 108  CO2 22 26  GLUCOSE 91 114*  BUN 9 8  CREATININE 0.59 0.62  CALCIUM 9.5 8.7*   GFR: Estimated Creatinine Clearance: 66.5 mL/min (by C-G formula based on SCr of 0.62 mg/dL). Liver Function Tests: Recent Labs  Lab 03/02/22 1239  AST 25  ALT 20  ALKPHOS 31*  BILITOT 1.0  PROT 7.2  ALBUMIN 4.6   No results for input(s): "LIPASE", "AMYLASE" in the last 168 hours. No results for input(s): "AMMONIA" in the last 168 hours. Coagulation Profile: No results for input(s): "INR", "PROTIME" in the last 168 hours. Cardiac Enzymes: No results for input(s): "CKTOTAL", "CKMB", "CKMBINDEX", "TROPONINI" in the last 168 hours. BNP (last 3 results) No results for input(s): "PROBNP" in the last 8760 hours. HbA1C: No results for input(s): "HGBA1C" in the last 72 hours. CBG: Recent Labs  Lab 03/03/22 0633  GLUCAP 101*   Lipid Profile: No results for input(s): "CHOL", "HDL", "LDLCALC", "TRIG", "CHOLHDL", "LDLDIRECT" in the last 72 hours. Thyroid Function Tests: No results for input(s): "TSH", "T4TOTAL", "FREET4", "T3FREE", "THYROIDAB" in the last 72 hours. Anemia Panel: No results for input(s): "VITAMINB12", "FOLATE", "FERRITIN", "TIBC", "IRON", "RETICCTPCT" in the last 72 hours. Sepsis Labs: No results for input(s): "PROCALCITON", "LATICACIDVEN" in the last 168 hours.  No results found for this or any previous visit (from the past 240 hour(s)).   Radiology Studies: ECHOCARDIOGRAM COMPLETE  Result Date: 03/03/2022    ECHOCARDIOGRAM REPORT   Patient Name:   Alexis English Date of Exam: 03/03/2022 Medical Rec #:  [id number]         Height:       63.0 in Accession #:    [id number]        Weight:       120.0 lb Date of Birth:  [birth year]          BSA:          1.556 m Patient Age:    38 years          BP:           123/81 mmHg Patient Gender: F                 HR:           74 bpm. Exam Location:  Inpatient Procedure: 2D Echo, Cardiac Doppler and Color Doppler Indications:    Syncope  History:        Patient has no prior history of Echocardiogram examinations.  Sonographer:    Merrie Roof RDCS Referring Phys: [id number] [place]  1. Left ventricular ejection fraction, by estimation, is 60 to 65%. The left ventricle has normal function. The left ventricle has no regional wall motion abnormalities. Left ventricular diastolic parameters were normal.  2. Right ventricular systolic function is normal. The right ventricular size is normal.  3. The mitral valve is abnormal. Trivial mitral valve regurgitation. No evidence of mitral stenosis.  4. The  aortic valve is tricuspid. Aortic valve regurgitation is not visualized. No aortic stenosis is present.  5. The inferior vena cava is normal in size with greater than 50% respiratory variability, suggesting right atrial pressure of 3 mmHg. FINDINGS  Left Ventricle: Left ventricular ejection fraction, by estimation, is 60 to 65%. The left ventricle has normal function. The left ventricle has no regional wall motion abnormalities. The left ventricular internal cavity size was normal in size. There is  no left ventricular hypertrophy. Left ventricular diastolic parameters were normal. Right Ventricle: The right ventricular size is normal. No increase in right ventricular wall thickness. Right ventricular systolic function is normal. Left Atrium: Left atrial size was normal in size. Right Atrium: Right atrial size was normal in size. Pericardium: There is no evidence of pericardial effusion. Mitral Valve: The mitral valve is abnormal. There is mild thickening of the mitral valve leaflet(s). There is mild calcification of the mitral valve leaflet(s). Trivial mitral valve regurgitation. No evidence of mitral  valve stenosis. Tricuspid Valve: The tricuspid valve is normal in structure. Tricuspid valve regurgitation is trivial. No evidence of tricuspid stenosis. Aortic Valve: The aortic valve is tricuspid. Aortic valve regurgitation is not visualized. No aortic stenosis is present. Pulmonic Valve: The pulmonic valve was normal in structure. Pulmonic valve regurgitation is trivial. No evidence of pulmonic stenosis. Aorta: The aortic root is normal in size and structure. Venous: The inferior vena cava is normal in size with greater than 50% respiratory variability, suggesting right atrial pressure of 3 mmHg. IAS/Shunts: The interatrial septum was not well visualized.  LEFT VENTRICLE PLAX 2D LVIDd:         4.10 cm   Diastology LVIDs:         2.70 cm   LV e' medial:    10.40 cm/s LV PW:         0.80 cm   LV E/e' medial:  6.2 LV IVS:        0.70 cm   LV e' lateral:   11.60 cm/s LVOT diam:     1.90 cm   LV E/e' lateral: 5.6 LV SV:         45 LV SV Index:   29 LVOT Area:     2.84 cm  RIGHT VENTRICLE RV Basal diam:  3.80 cm TAPSE (M-mode): 2.7 cm LEFT ATRIUM             Index        RIGHT ATRIUM           Index LA diam:        2.50 cm 1.61 cm/m   RA Area:     13.00 cm LA Vol (A2C):   54.0 ml 34.70 ml/m  RA Volume:   32.40 ml  20.82 ml/m LA Vol (A4C):   70.2 ml 45.11 ml/m LA Biplane Vol: 64.0 ml 41.12 ml/m  AORTIC VALVE LVOT Vmax:   82.80 cm/s LVOT Vmean:  54.500 cm/s LVOT VTI:    0.160 m  AORTA Ao Root diam: 3.30 cm Ao Asc diam:  3.50 cm MITRAL VALVE MV Area (PHT): 3.72 cm    SHUNTS MV Decel Time: 204 msec    Systemic VTI:  0.16 m MV E velocity: 65.00 cm/s  Systemic Diam: 1.90 cm MV A velocity: 75.50 cm/s MV E/A ratio:  0.86 Jenkins Rouge MD Electronically signed by Jenkins Rouge MD Signature Date/Time: 03/03/2022/12:21:20 PM    Final    MR Cervical Spine Wo Contrast  Result Date: 03/03/2022  CLINICAL DATA:  Initial evaluation for acute neck pain. EXAM: MRI CERVICAL SPINE WITHOUT CONTRAST TECHNIQUE: Multiplanar,  multisequence MR imaging of the cervical spine was performed. No intravenous contrast was administered. COMPARISON:  Prior CT from earlier same day as well as prior MRI from 04/09/2012. FINDINGS: Alignment: Straightening with mild reversal of the normal cervical lordosis. Trace anterolisthesis of C2 on C3, with trace retrolisthesis of C3 on C4. 2 mm retrolisthesis of C5 on C6, with trace retrolisthesis of C6 on C7. Vertebrae: Prior ACDF at C4-5 with solid arthrodesis. Vertebral body height maintained. Bone marrow signal intensity within normal limits. No discrete or worrisome osseous lesions. Discogenic reactive endplate change present about the C3-4 interspace. No other abnormal marrow edema. Cord: Suspected subtle patchy cord signal abnormality at the level of C3-4, likely compressive myelomalacia of due to stenosis at this level (series 5, image 10). Signal intensity within the visualized cord otherwise within normal limits. Posterior Fossa, vertebral arteries, paraspinal tissues: Unremarkable. Disc levels: C2-C3: Small left paracentral disc protrusion indents the ventral thecal sac. Mild left-sided facet hypertrophy. No significant spinal stenosis. Foramina remain patent. C3-C4: Degenerative intervertebral disc space narrowing. Broad-based central disc protrusion indents and effaces the ventral thecal sac, contacting and flattening the cord at this level. Associated subtle patchy cord signal changes suspicious for myelomalacia. Severe spinal stenosis with the thecal sac measuring 4-5 mm in AP diameter at its most narrow point. Severe bilateral C4 foraminal narrowing. C4-C5:  Prior fusion.  No residual canal or foraminal stenosis. C5-C6: Broad-based posterior disc osteophyte complex flattens and partially faces the ventral thecal sac. Mild cord flattening without cord signal changes. Resultant moderate to severe spinal stenosis. Left worse than right uncovertebral spurring with resultant severe left worse than  right C6 foraminal stenosis. C6-C7: Small left paracentral disc protrusion indents the ventral thecal sac (series 8, image 34). Mild spinal stenosis. Superimposed uncovertebral spurring with resultant mild bilateral C7 foraminal stenosis. C7-T1:  Unremarkable. Visualized upper thoracic spine demonstrates no significant finding. IMPRESSION: 1. Broad-based central disc protrusion at C3-4 with resultant severe spinal stenosis and flattening of the cervical spinal cord. Associated subtle patchy cord signal abnormality suspicious for associated compressive myelomalacia. 2. Broad-based posterior disc osteophyte complex at C5-6 with resultant moderate to severe spinal stenosis, with severe left worse than right C6 foraminal narrowing. 3. Small left paracentral disc protrusion at C6-7 with resultant mild canal and bilateral C7 foraminal stenosis. 4. Prior ACDF at C4-5 without residual stenosis. Electronically Signed   By: Jeannine Boga M.D.   On: 03/03/2022 00:15   MR BRAIN WO CONTRAST  Result Date: 03/03/2022 CLINICAL DATA:  Initial evaluation for neuro deficit, stroke suspected. EXAM: MRI HEAD WITHOUT CONTRAST TECHNIQUE: Multiplanar, multiecho pulse sequences of the brain and surrounding structures were obtained without intravenous contrast. COMPARISON:  Prior CT from earlier the same day. FINDINGS: Brain: Cerebral volume within normal limits for patient age. No focal parenchymal signal abnormality identified. No abnormal foci of restricted diffusion to suggest acute or subacute ischemia. Gray-white matter differentiation well maintained. No encephalomalacia to suggest chronic infarction. No foci of susceptibility artifact to suggest acute or chronic intracranial hemorrhage. No mass lesion, midline shift or mass effect. No hydrocephalus. No extra-axial fluid collection. Pituitary gland and suprasellar region are normal. Midline structures intact and normal. Vascular: Major intracranial vascular flow voids well  maintained. Skull and upper cervical spine: Craniocervical junction within normal limits. Postsurgical changes partially visualize within the upper cervical spine. Bone marrow signal intensity normal. No scalp soft tissue  abnormality. Sinuses/Orbits: Globes and orbital soft tissues within normal limits. Paranasal sinuses are clear.  No significant mastoid effusion. Other: None. IMPRESSION: Normal brain MRI. No acute intracranial abnormality identified. Electronically Signed   By: Jeannine Boga M.D.   On: 03/03/2022 00:08   CT Head Wo Contrast  Result Date: 03/02/2022 CLINICAL DATA:  Syncope/presyncope, cerebrovascular cause suspected; Neck trauma, dangerous injury mechanism (Age 56-64y) EXAM: CT HEAD WITHOUT CONTRAST CT CERVICAL SPINE WITHOUT CONTRAST TECHNIQUE: Multidetector CT imaging of the head and cervical spine was performed following the standard protocol without intravenous contrast. Multiplanar CT image reconstructions of the cervical spine were also generated. RADIATION DOSE REDUCTION: This exam was performed according to the departmental dose-optimization program which includes automated exposure control, adjustment of the mA and/or kV according to patient size and/or use of iterative reconstruction technique. COMPARISON:  None Available. FINDINGS: CT HEAD FINDINGS Brain: No evidence of acute infarction, hemorrhage, hydrocephalus, extra-axial collection or mass lesion/mass effect. Vascular: No hyperdense vessel or unexpected calcification. Skull: Normal. Negative for fracture or focal lesion. Sinuses/Orbits: No acute finding. Other: Negative for scalp hematoma. CT CERVICAL SPINE FINDINGS Alignment: Facet joints are aligned without dislocation or traumatic listhesis. Dens and lateral masses are aligned. Straightening of the cervical lordosis. Skull base and vertebrae: Prior C4-5 ACDF with solid ankylosis. No acute fracture. No pathologic bone process identified. Soft tissues and spinal canal: No  prevertebral fluid or swelling. No visible canal hematoma. Disc levels:  Degenerative disc disease at C3-4 and C5-6. Upper chest: Negative. Other: None. IMPRESSION: 1. No acute intracranial abnormality. 2. No acute fracture or subluxation of the cervical spine. 3. Prior C4-5 ACDF with solid ankylosis. Adjacent segment disease at C3-4 and C5-6. Electronically Signed   By: Davina Poke D.O.   On: 03/02/2022 13:01   CT Cervical Spine Wo Contrast  Result Date: 03/02/2022 CLINICAL DATA:  Syncope/presyncope, cerebrovascular cause suspected; Neck trauma, dangerous injury mechanism (Age 7-64y) EXAM: CT HEAD WITHOUT CONTRAST CT CERVICAL SPINE WITHOUT CONTRAST TECHNIQUE: Multidetector CT imaging of the head and cervical spine was performed following the standard protocol without intravenous contrast. Multiplanar CT image reconstructions of the cervical spine were also generated. RADIATION DOSE REDUCTION: This exam was performed according to the departmental dose-optimization program which includes automated exposure control, adjustment of the mA and/or kV according to patient size and/or use of iterative reconstruction technique. COMPARISON:  None Available. FINDINGS: CT HEAD FINDINGS Brain: No evidence of acute infarction, hemorrhage, hydrocephalus, extra-axial collection or mass lesion/mass effect. Vascular: No hyperdense vessel or unexpected calcification. Skull: Normal. Negative for fracture or focal lesion. Sinuses/Orbits: No acute finding. Other: Negative for scalp hematoma. CT CERVICAL SPINE FINDINGS Alignment: Facet joints are aligned without dislocation or traumatic listhesis. Dens and lateral masses are aligned. Straightening of the cervical lordosis. Skull base and vertebrae: Prior C4-5 ACDF with solid ankylosis. No acute fracture. No pathologic bone process identified. Soft tissues and spinal canal: No prevertebral fluid or swelling. No visible canal hematoma. Disc levels:  Degenerative disc disease at  C3-4 and C5-6. Upper chest: Negative. Other: None. IMPRESSION: 1. No acute intracranial abnormality. 2. No acute fracture or subluxation of the cervical spine. 3. Prior C4-5 ACDF with solid ankylosis. Adjacent segment disease at C3-4 and C5-6. Electronically Signed   By: Davina Poke D.O.   On: 03/02/2022 13:01     Scheduled Meds:  amLODipine  10 mg Oral Daily   dexamethasone (DECADRON) injection  4 mg Intravenous S0Y   folic acid  1 mg Oral Daily  gabapentin  300 mg Oral TID   LORazepam  0-4 mg Intravenous Q6H   Followed by   Derrill Memo ON 03/05/2022] LORazepam  0-4 mg Intravenous Q12H   multivitamin with minerals  1 tablet Oral Daily   sodium chloride flush  3 mL Intravenous Q12H   thiamine injection  100 mg Intravenous Daily   thiamine  100 mg Oral Daily   Or   thiamine  100 mg Intravenous Daily   venlafaxine XR  75 mg Oral Q breakfast   Continuous Infusions:     LOS: 0 days    Time spent: 50 mins    Lilas Diefendorf, MD [facility]   If 7PM-7AM, please contact night-coverage

## 2022-03-04 DIAGNOSIS — M4802 Spinal stenosis, cervical region: Secondary | ICD-10-CM | POA: Diagnosis not present

## 2022-03-04 DIAGNOSIS — R55 Syncope and collapse: Secondary | ICD-10-CM | POA: Diagnosis not present

## 2022-03-04 LAB — GLUCOSE, CAPILLARY
Glucose-Capillary: 148 mg/dL — ABNORMAL HIGH (ref 70–99)
Glucose-Capillary: 160 mg/dL — ABNORMAL HIGH (ref 70–99)

## 2022-03-04 MED ORDER — PANTOPRAZOLE SODIUM 40 MG PO TBEC
40.0000 mg | DELAYED_RELEASE_TABLET | Freq: Every day | ORAL | Status: DC
  Administered 2022-03-04 – 2022-03-09 (×5): 40 mg via ORAL
  Filled 2022-03-04 (×6): qty 1

## 2022-03-04 MED ORDER — CYCLOBENZAPRINE HCL 10 MG PO TABS
5.0000 mg | ORAL_TABLET | Freq: Three times a day (TID) | ORAL | Status: DC
Start: 2022-03-04 — End: 2022-03-09
  Administered 2022-03-04 – 2022-03-09 (×13): 5 mg via ORAL
  Filled 2022-03-04 (×14): qty 1

## 2022-03-04 NOTE — Plan of Care (Signed)

## 2022-03-04 NOTE — Progress Notes (Signed)
Patient ID: Alexis English, female   DOB: 07/22/1966, 56 y.o.   MRN: [id number] Vital signs are stable The patient is anxious about surgery but knows it needs to be done as she is having considerable pain and distress with this process She describes her falling episode in great detail She is feeling weak with her legs and is concerned over matters inside and outside the hospital Reassurance was offered

## 2022-03-04 NOTE — Progress Notes (Signed)
Progress Note:    Alexis English    IOX:[id number] DOB: 1966/08/25 DOA: 03/02/2022  PCP: Debbrah Alar, NP    Brief Narrative:   This 56 y.o. female with PMH significant for hypertension, depression, anxiety, and C4-5 fusion in [place] more than 20 years ago, presented in the ED after 2 syncopal episodes.  Patient reports she was in her usual state then suddenly she became acutely lightheaded and lost consciousness shortly after standing.  She began to experience severe pain in upper back both shoulders and both arms and has difficulty getting up due to weakness involving arms and legs.  She was able to crawl to her bed but had difficulty sleeping due to the pain.  Work-up in the ED CT head negative for acute intracranial abnormality.  CT C-spine negative for subluxation or fracture.  MRI C.Spine showed severe stenosis at C3 and C4 and C5 and C6.  Patient is admitted for further evaluation.  Patient was seen by neurosurgery,  recommended Neurosurgical intervention on Monday.  Patient started on Decadron 4 mg every 6 hours.    Subjective:   Seen and evaluated this morning.  Neurosurgery present at bedside.  Pain relatively well controlled on current pain scale.  We will add a muscle relaxer for additional relief.    Assessment and Plan:   Syncope: Likely secondary to cervical spinal stenosis and consistent with central cord syndrome.  Neurosurgery consulted and following.  Neurosurgical intervention planned on Monday.  Continue Decadron and gabapentin for pain control.  Continue current pain scale.  Start Flexeril.  Cervical spinal stenosis: MRI concerning for severe stenosis at C3-C4 and C5-C6.  Plan as above  Essential hypertension: Continue home Norvasc 10 mg daily  Depression/anxiety: Continue home Effexor 75 mg daily  Alcohol dependence: Patient drinks daily usually 3 glasses of wine.  Denies any history of alcohol withdrawal.  Continue CIWA protocol  Other  information:    DVT prophylaxis: SCDs Code Status: Full Family Communication: None present at bedside Disposition:   Status is: Inpatient Remains inpatient appropriate because: Neurosurgical intervention on Monday       Consultants:   Neurosurgery    Objective:    Vitals:   03/04/22 0500 03/04/22 0515 03/04/22 0821 03/04/22 1152  BP:  122/78 117/77 117/77  Pulse:  69 75 (!) 57  Resp:  '20 16 15  '$ Temp:  98.7 F (37.1 C) 98.7 F (37.1 C) 98 F (36.7 C)  TempSrc:  Oral Oral Oral  SpO2:  98% 97% 96%  Weight: 56.2 kg     Height:       No intake or output data in the 24 hours ending 03/04/22 1323 Filed Weights   03/02/22 1220 03/04/22 0500  Weight: 54.4 kg 56.2 kg       Physical Exam:    General exam: Appears calm and comfortable  Respiratory system: Clear to auscultation. Respiratory effort normal. Cardiovascular system: S1 & S2 heard, RRR. No JVD, murmurs, rubs, gallops or clicks. No pedal edema. Gastrointestinal system: Abdomen is nondistended, soft and nontender. No organomegaly or masses felt. Normal bowel sounds heard. Central nervous system: Alert and oriented. No focal neurological deficits. Extremities: Symmetric 5 x 5 power. Skin: No rashes, lesions or ulcers Psychiatry: Judgement and insight appear normal. Mood & affect appropriate.     Data Reviewed:    I have personally reviewed following labs and imaging studies  CBC: Recent Labs  Lab 03/02/22 1239 03/03/22 0414  WBC 13.3* 7.4  HGB 15.4* 14.3  HCT 43.4 41.2  MCV 96.0 96.7  PLT 386 176    Basic Metabolic Panel: Recent Labs  Lab 03/02/22 1239 03/03/22 0414  NA 137 141  K 3.8 3.5  CL 103 108  CO2 22 26  GLUCOSE 91 114*  BUN 9 8  CREATININE 0.59 0.62  CALCIUM 9.5 8.7*    GFR: Estimated Creatinine Clearance: 66.5 mL/min (by C-G formula based on SCr of 0.62 mg/dL).  Liver Function Tests: Recent Labs  Lab 03/02/22 1239  AST 25  ALT 20  ALKPHOS 31*  BILITOT 1.0   PROT 7.2  ALBUMIN 4.6    CBG: Recent Labs  Lab 03/03/22 0633 03/04/22 0525  GLUCAP 101* 148*     Recent Results (from the past 240 hour(s))  Surgical PCR screen     Status: None   Collection Time: 03/03/22  7:00 PM   Specimen: Nasal Mucosa; Nasal Swab  Result Value Ref Range Status   MRSA, PCR NEGATIVE NEGATIVE Final   Staphylococcus aureus NEGATIVE NEGATIVE Final    Comment: (NOTE) The Xpert SA Assay (FDA approved for NASAL specimens in patients 17 years of age and older), is one component of a comprehensive surveillance program. It is not intended to diagnose infection nor to guide or monitor treatment. Performed at [facility], [place] [address] [place], [place] [address]          Radiology Studies:    ECHOCARDIOGRAM COMPLETE  Result Date: 03/03/2022    ECHOCARDIOGRAM REPORT   Patient Name:   Alexis English Date of Exam: 03/03/2022 Medical Rec #:  [id number]         Height:       63.0 in Accession #:    [id number]        Weight:       120.0 lb Date of Birth:  Jun 30, 1966         BSA:          1.556 m Patient Age:    73 years          BP:           123/81 mmHg Patient Gender: F                 HR:           74 bpm. Exam Location:  Inpatient Procedure: 2D Echo, Cardiac Doppler and Color Doppler Indications:    Syncope  History:        Patient has no prior history of Echocardiogram examinations.  Sonographer:    Merrie Roof RDCS Referring Phys: [id number] Guy  1. Left ventricular ejection fraction, by estimation, is 60 to 65%. The left ventricle has normal function. The left ventricle has no regional wall motion abnormalities. Left ventricular diastolic parameters were normal.  2. Right ventricular systolic function is normal. The right ventricular size is normal.  3. The mitral valve is abnormal. Trivial mitral valve regurgitation. No evidence of mitral stenosis.  4. The aortic valve is tricuspid. Aortic valve regurgitation is not visualized. No  aortic stenosis is present.  5. The inferior vena cava is normal in size with greater than 50% respiratory variability, suggesting right atrial pressure of 3 mmHg. FINDINGS  Left Ventricle: Left ventricular ejection fraction, by estimation, is 60 to 65%. The left ventricle has normal function. The left ventricle has no regional wall motion abnormalities. The left ventricular internal cavity size was normal in size. There is  no left ventricular hypertrophy. Left ventricular diastolic parameters were normal. Right Ventricle: The right ventricular size is normal. No increase in right ventricular wall thickness. Right ventricular systolic function is normal. Left Atrium: Left atrial size was normal in size. Right Atrium: Right atrial size was normal in size. Pericardium: There is no evidence of pericardial effusion. Mitral Valve: The mitral valve is abnormal. There is mild thickening of the mitral valve leaflet(s). There is mild calcification of the mitral valve leaflet(s). Trivial mitral valve regurgitation. No evidence of mitral valve stenosis. Tricuspid Valve: The tricuspid valve is normal in structure. Tricuspid valve regurgitation is trivial. No evidence of tricuspid stenosis. Aortic Valve: The aortic valve is tricuspid. Aortic valve regurgitation is not visualized. No aortic stenosis is present. Pulmonic Valve: The pulmonic valve was normal in structure. Pulmonic valve regurgitation is trivial. No evidence of pulmonic stenosis. Aorta: The aortic root is normal in size and structure. Venous: The inferior vena cava is normal in size with greater than 50% respiratory variability, suggesting right atrial pressure of 3 mmHg. IAS/Shunts: The interatrial septum was not well visualized.  LEFT VENTRICLE PLAX 2D LVIDd:         4.10 cm   Diastology LVIDs:         2.70 cm   LV e' medial:    10.40 cm/s LV PW:         0.80 cm   LV E/e' medial:  6.2 LV IVS:        0.70 cm   LV e' lateral:   11.60 cm/s LVOT diam:     1.90 cm    LV E/e' lateral: 5.6 LV SV:         45 LV SV Index:   29 LVOT Area:     2.84 cm  RIGHT VENTRICLE RV Basal diam:  3.80 cm TAPSE (M-mode): 2.7 cm LEFT ATRIUM             Index        RIGHT ATRIUM           Index LA diam:        2.50 cm 1.61 cm/m   RA Area:     13.00 cm LA Vol (A2C):   54.0 ml 34.70 ml/m  RA Volume:   32.40 ml  20.82 ml/m LA Vol (A4C):   70.2 ml 45.11 ml/m LA Biplane Vol: 64.0 ml 41.12 ml/m  AORTIC VALVE LVOT Vmax:   82.80 cm/s LVOT Vmean:  54.500 cm/s LVOT VTI:    0.160 m  AORTA Ao Root diam: 3.30 cm Ao Asc diam:  3.50 cm MITRAL VALVE MV Area (PHT): 3.72 cm    SHUNTS MV Decel Time: 204 msec    Systemic VTI:  0.16 m MV E velocity: 65.00 cm/s  Systemic Diam: 1.90 cm MV A velocity: 75.50 cm/s MV E/A ratio:  0.86 Jenkins Rouge MD Electronically signed by Jenkins Rouge MD Signature Date/Time: 03/03/2022/12:21:20 PM    Final    MR Cervical Spine Wo Contrast  Result Date: 03/03/2022 CLINICAL DATA:  Initial evaluation for acute neck pain. EXAM: MRI CERVICAL SPINE WITHOUT CONTRAST TECHNIQUE: Multiplanar, multisequence MR imaging of the cervical spine was performed. No intravenous contrast was administered. COMPARISON:  Prior CT from earlier same day as well as prior MRI from 04/09/2012. FINDINGS: Alignment: Straightening with mild reversal of the normal cervical lordosis. Trace anterolisthesis of C2 on C3, with trace retrolisthesis of C3 on C4. 2 mm retrolisthesis of C5 on C6, with trace retrolisthesis  of C6 on C7. Vertebrae: Prior ACDF at C4-5 with solid arthrodesis. Vertebral body height maintained. Bone marrow signal intensity within normal limits. No discrete or worrisome osseous lesions. Discogenic reactive endplate change present about the C3-4 interspace. No other abnormal marrow edema. Cord: Suspected subtle patchy cord signal abnormality at the level of C3-4, likely compressive myelomalacia of due to stenosis at this level (series 5, image 10). Signal intensity within the visualized cord  otherwise within normal limits. Posterior Fossa, vertebral arteries, paraspinal tissues: Unremarkable. Disc levels: C2-C3: Small left paracentral disc protrusion indents the ventral thecal sac. Mild left-sided facet hypertrophy. No significant spinal stenosis. Foramina remain patent. C3-C4: Degenerative intervertebral disc space narrowing. Broad-based central disc protrusion indents and effaces the ventral thecal sac, contacting and flattening the cord at this level. Associated subtle patchy cord signal changes suspicious for myelomalacia. Severe spinal stenosis with the thecal sac measuring 4-5 mm in AP diameter at its most narrow point. Severe bilateral C4 foraminal narrowing. C4-C5:  Prior fusion.  No residual canal or foraminal stenosis. C5-C6: Broad-based posterior disc osteophyte complex flattens and partially faces the ventral thecal sac. Mild cord flattening without cord signal changes. Resultant moderate to severe spinal stenosis. Left worse than right uncovertebral spurring with resultant severe left worse than right C6 foraminal stenosis. C6-C7: Small left paracentral disc protrusion indents the ventral thecal sac (series 8, image 34). Mild spinal stenosis. Superimposed uncovertebral spurring with resultant mild bilateral C7 foraminal stenosis. C7-T1:  Unremarkable. Visualized upper thoracic spine demonstrates no significant finding. IMPRESSION: 1. Broad-based central disc protrusion at C3-4 with resultant severe spinal stenosis and flattening of the cervical spinal cord. Associated subtle patchy cord signal abnormality suspicious for associated compressive myelomalacia. 2. Broad-based posterior disc osteophyte complex at C5-6 with resultant moderate to severe spinal stenosis, with severe left worse than right C6 foraminal narrowing. 3. Small left paracentral disc protrusion at C6-7 with resultant mild canal and bilateral C7 foraminal stenosis. 4. Prior ACDF at C4-5 without residual stenosis.  Electronically Signed   By: Jeannine Boga M.D.   On: 03/03/2022 00:15   MR BRAIN WO CONTRAST  Result Date: 03/03/2022 CLINICAL DATA:  Initial evaluation for neuro deficit, stroke suspected. EXAM: MRI HEAD WITHOUT CONTRAST TECHNIQUE: Multiplanar, multiecho pulse sequences of the brain and surrounding structures were obtained without intravenous contrast. COMPARISON:  Prior CT from earlier the same day. FINDINGS: Brain: Cerebral volume within normal limits for patient age. No focal parenchymal signal abnormality identified. No abnormal foci of restricted diffusion to suggest acute or subacute ischemia. Gray-white matter differentiation well maintained. No encephalomalacia to suggest chronic infarction. No foci of susceptibility artifact to suggest acute or chronic intracranial hemorrhage. No mass lesion, midline shift or mass effect. No hydrocephalus. No extra-axial fluid collection. Pituitary gland and suprasellar region are normal. Midline structures intact and normal. Vascular: Major intracranial vascular flow voids well maintained. Skull and upper cervical spine: Craniocervical junction within normal limits. Postsurgical changes partially visualize within the upper cervical spine. Bone marrow signal intensity normal. No scalp soft tissue abnormality. Sinuses/Orbits: Globes and orbital soft tissues within normal limits. Paranasal sinuses are clear.  No significant mastoid effusion. Other: None. IMPRESSION: Normal brain MRI. No acute intracranial abnormality identified. Electronically Signed   By: Jeannine Boga M.D.   On: 03/03/2022 00:08        Medications:    Scheduled Meds:  amLODipine  10 mg Oral Daily   cyclobenzaprine  5 mg Oral TID   dexamethasone (DECADRON) injection  4 mg Intravenous  C1Y   folic acid  1 mg Oral Daily   gabapentin  300 mg Oral TID   LORazepam  0-4 mg Intravenous Q6H   Followed by   Derrill Memo ON 03/05/2022] LORazepam  0-4 mg Intravenous Q12H   multivitamin with  minerals  1 tablet Oral Daily   pantoprazole  40 mg Oral Daily   pneumococcal 20-valent conjugate vaccine  0.5 mL Intramuscular Tomorrow-1000   sodium chloride flush  3 mL Intravenous Q12H   thiamine injection  100 mg Intravenous Daily   thiamine  100 mg Oral Daily   Or   thiamine  100 mg Intravenous Daily   venlafaxine XR  75 mg Oral Q breakfast   Continuous Infusions:     LOS: 1 day    Time spent: 55 minutes    Leslee Home, MD [facility]   To contact the attending provider between 7A-7P or the covering provider during after hours 7P-7A, please log into the web site [email or web address] and access using universal  password for that web site. If you do not have the password, please call the hospital operator.  03/04/2022, 1:23 PM

## 2022-03-05 DIAGNOSIS — R55 Syncope and collapse: Secondary | ICD-10-CM | POA: Diagnosis not present

## 2022-03-05 LAB — GLUCOSE, CAPILLARY
Glucose-Capillary: 139 mg/dL — ABNORMAL HIGH (ref 70–99)
Glucose-Capillary: 152 mg/dL — ABNORMAL HIGH (ref 70–99)

## 2022-03-05 NOTE — Progress Notes (Signed)
Patient ID: Alexis English, female   DOB: [birth year], 56 y.o.   MRN: [id number] On schedule for tomorrow afternoon. Patient is anticipating surgery. I have advised that Dr. Reatha Armour will be by in the morning to discuss surgical details with her.

## 2022-03-05 NOTE — Plan of Care (Signed)
  Problem: Skin Integrity: Goal: Risk for impaired skin integrity will decrease Outcome: Progressing   Problem: Education: Goal: Knowledge of condition and prescribed therapy will improve Outcome: Progressing   Problem: Cardiac: Goal: Will achieve and/or maintain adequate cardiac output Outcome: Progressing   Problem: Physical Regulation: Goal: Complications related to the disease process, condition or treatment will be avoided or minimized Outcome: Progressing

## 2022-03-05 NOTE — Progress Notes (Signed)
Progress Note:    Alexis English    DZH:[id number] DOB: [birth year] DOA: 03/02/2022  PCP: Debbrah Alar, NP    Brief Narrative:   This 56 y.o. female with PMH significant for hypertension, depression, anxiety, and C4-5 fusion in [place] more than 20 years ago, presented in the ED after 2 syncopal episodes.  Patient reports she was in her usual state then suddenly she became acutely lightheaded and lost consciousness shortly after standing.  She began to experience severe pain in upper back both shoulders and both arms and has difficulty getting up due to weakness involving arms and legs.  She was able to crawl to her bed but had difficulty sleeping due to the pain.  Work-up in the ED CT head negative for acute intracranial abnormality.  CT C-spine negative for subluxation or fracture.  MRI C.Spine showed severe stenosis at C3 and C4 and C5 and C6.  Patient is admitted for further evaluation.  Patient was seen by neurosurgery,  recommended Neurosurgical intervention on Monday.  Patient started on Decadron 4 mg every 6 hours.    Subjective:   Seen and evaluated this morning.  Neurosurgery present at bedside.  Pain relatively well controlled on current pain scale.    Assessment and Plan:   Syncope: Likely secondary to cervical spinal stenosis and consistent with central cord syndrome.  Neurosurgery consulted and following.  Neurosurgical intervention planned on Monday.  Continue Decadron and gabapentin for pain control.  Continue current pain scale. Continue muscle relaxer. ECHO unremarkable. No arrhythmogenic events on telemetry.  Cervical spinal stenosis of C3-4, and C5-6 with myelopathy and central cord syndrome: Plan as above  Essential hypertension: Continue home Norvasc 10 mg daily  Depression/anxiety: Continue home Effexor 75 mg daily  Tobacco use disorder: Counseled on smoking cessation. NRT with nicorette gum  Alcohol dependence: Patient drinks daily  usually 3 glasses of wine.  Denies any history of alcohol withdrawal.  Continue CIWA protocol. MV, thiamine, folic acid.  Other information:    DVT prophylaxis: SCDs Code Status: Full Family Communication: None present at bedside Disposition:   Status is: Inpatient Remains inpatient appropriate because: Neurosurgical intervention on Monday     Consultants:   Neurosurgery    Objective:    Vitals:   03/04/22 2057 03/05/22 0036 03/05/22 0500 03/05/22 0543  BP: 113/70 108/69  124/73  Pulse: (!) 58 63  (!) 59  Resp: 16 16    Temp:  98 F (36.7 C)  98.9 F (37.2 C)  TempSrc:  Oral  Oral  SpO2: 98% 98%  98%  Weight:   56.2 kg   Height:        Intake/Output Summary (Last 24 hours) at 03/05/2022 1118 Last data filed at 03/05/2022 0100 Gross per 24 hour  Intake 600 ml  Output --  Net 600 ml   Filed Weights   03/02/22 1220 03/04/22 0500 03/05/22 0500  Weight: 54.4 kg 56.2 kg 56.2 kg       Physical Exam:    General exam: Appears calm and comfortable  Respiratory system: Clear to auscultation. Respiratory effort normal. Cardiovascular system: S1 & S2 heard, RRR. No JVD, murmurs, rubs, gallops or clicks. No pedal edema. Gastrointestinal system: Abdomen is nondistended, soft and nontender. No organomegaly or masses felt. Normal bowel sounds heard. Central nervous system: Alert and oriented. No focal neurological deficits. Extremities: Symmetric 5 x 5 power. Skin: No rashes, lesions or ulcers Psychiatry: Judgement and insight appear normal. Mood & affect  appropriate.     Data Reviewed:    I have personally reviewed following labs and imaging studies  CBC: Recent Labs  Lab 03/02/22 1239 03/03/22 0414  WBC 13.3* 7.4  HGB 15.4* 14.3  HCT 43.4 41.2  MCV 96.0 96.7  PLT 386 330     Basic Metabolic Panel: Recent Labs  Lab 03/02/22 1239 03/03/22 0414  NA 137 141  K 3.8 3.5  CL 103 108  CO2 22 26  GLUCOSE 91 114*  BUN 9 8  CREATININE 0.59 0.62   CALCIUM 9.5 8.7*     GFR: Estimated Creatinine Clearance: 66.5 mL/min (by C-G formula based on SCr of 0.62 mg/dL).  Liver Function Tests: Recent Labs  Lab 03/02/22 1239  AST 25  ALT 20  ALKPHOS 31*  BILITOT 1.0  PROT 7.2  ALBUMIN 4.6     CBG: Recent Labs  Lab 03/03/22 0633 03/04/22 0525 03/04/22 2223 03/05/22 0508  GLUCAP 101* 148* 160* 139*      Recent Results (from the past 240 hour(s))  Surgical PCR screen     Status: None   Collection Time: 03/03/22  7:00 PM   Specimen: Nasal Mucosa; Nasal Swab  Result Value Ref Range Status   MRSA, PCR NEGATIVE NEGATIVE Final   Staphylococcus aureus NEGATIVE NEGATIVE Final    Comment: (NOTE) The Xpert SA Assay (FDA approved for NASAL specimens in patients 38 years of age and older), is one component of a comprehensive surveillance program. It is not intended to diagnose infection nor to guide or monitor treatment. Performed at [facility], [place] [address] [place], [place] [address]          Radiology Studies:    ECHOCARDIOGRAM COMPLETE  Result Date: 03/03/2022    ECHOCARDIOGRAM REPORT   Patient Name:   Alexis English Date of Exam: 03/03/2022 Medical Rec #:  [id number]         Height:       63.0 in Accession #:    [id number]        Weight:       120.0 lb Date of Birth:  02-17-1966         BSA:          1.556 m Patient Age:    67 years          BP:           123/81 mmHg Patient Gender: F                 HR:           74 bpm. Exam Location:  Inpatient Procedure: 2D Echo, Cardiac Doppler and Color Doppler Indications:    Syncope  History:        Patient has no prior history of Echocardiogram examinations.  Sonographer:    Merrie Roof RDCS Referring Phys: [id number] [place]  1. Left ventricular ejection fraction, by estimation, is 60 to 65%. The left ventricle has normal function. The left ventricle has no regional wall motion abnormalities. Left ventricular diastolic parameters were normal.  2.  Right ventricular systolic function is normal. The right ventricular size is normal.  3. The mitral valve is abnormal. Trivial mitral valve regurgitation. No evidence of mitral stenosis.  4. The aortic valve is tricuspid. Aortic valve regurgitation is not visualized. No aortic stenosis is present.  5. The inferior vena cava is normal in size with greater than 50% respiratory variability, suggesting right atrial pressure of  3 mmHg. FINDINGS  Left Ventricle: Left ventricular ejection fraction, by estimation, is 60 to 65%. The left ventricle has normal function. The left ventricle has no regional wall motion abnormalities. The left ventricular internal cavity size was normal in size. There is  no left ventricular hypertrophy. Left ventricular diastolic parameters were normal. Right Ventricle: The right ventricular size is normal. No increase in right ventricular wall thickness. Right ventricular systolic function is normal. Left Atrium: Left atrial size was normal in size. Right Atrium: Right atrial size was normal in size. Pericardium: There is no evidence of pericardial effusion. Mitral Valve: The mitral valve is abnormal. There is mild thickening of the mitral valve leaflet(s). There is mild calcification of the mitral valve leaflet(s). Trivial mitral valve regurgitation. No evidence of mitral valve stenosis. Tricuspid Valve: The tricuspid valve is normal in structure. Tricuspid valve regurgitation is trivial. No evidence of tricuspid stenosis. Aortic Valve: The aortic valve is tricuspid. Aortic valve regurgitation is not visualized. No aortic stenosis is present. Pulmonic Valve: The pulmonic valve was normal in structure. Pulmonic valve regurgitation is trivial. No evidence of pulmonic stenosis. Aorta: The aortic root is normal in size and structure. Venous: The inferior vena cava is normal in size with greater than 50% respiratory variability, suggesting right atrial pressure of 3 mmHg. IAS/Shunts: The interatrial  septum was not well visualized.  LEFT VENTRICLE PLAX 2D LVIDd:         4.10 cm   Diastology LVIDs:         2.70 cm   LV e' medial:    10.40 cm/s LV PW:         0.80 cm   LV E/e' medial:  6.2 LV IVS:        0.70 cm   LV e' lateral:   11.60 cm/s LVOT diam:     1.90 cm   LV E/e' lateral: 5.6 LV SV:         45 LV SV Index:   29 LVOT Area:     2.84 cm  RIGHT VENTRICLE RV Basal diam:  3.80 cm TAPSE (M-mode): 2.7 cm LEFT ATRIUM             Index        RIGHT ATRIUM           Index LA diam:        2.50 cm 1.61 cm/m   RA Area:     13.00 cm LA Vol (A2C):   54.0 ml 34.70 ml/m  RA Volume:   32.40 ml  20.82 ml/m LA Vol (A4C):   70.2 ml 45.11 ml/m LA Biplane Vol: 64.0 ml 41.12 ml/m  AORTIC VALVE LVOT Vmax:   82.80 cm/s LVOT Vmean:  54.500 cm/s LVOT VTI:    0.160 m  AORTA Ao Root diam: 3.30 cm Ao Asc diam:  3.50 cm MITRAL VALVE MV Area (PHT): 3.72 cm    SHUNTS MV Decel Time: 204 msec    Systemic VTI:  0.16 m MV E velocity: 65.00 cm/s  Systemic Diam: 1.90 cm MV A velocity: 75.50 cm/s MV E/A ratio:  0.86 Jenkins Rouge MD Electronically signed by Jenkins Rouge MD Signature Date/Time: 03/03/2022/12:21:20 PM    Final         Medications:    Scheduled Meds:  amLODipine  10 mg Oral Daily   cyclobenzaprine  5 mg Oral TID   dexamethasone (DECADRON) injection  4 mg Intravenous O3J   folic acid  1 mg Oral Daily  gabapentin  300 mg Oral TID   LORazepam  0-4 mg Intravenous Q12H   multivitamin with minerals  1 tablet Oral Daily   pantoprazole  40 mg Oral Daily   pneumococcal 20-valent conjugate vaccine  0.5 mL Intramuscular Tomorrow-1000   sodium chloride flush  3 mL Intravenous Q12H   thiamine injection  100 mg Intravenous Daily   thiamine  100 mg Oral Daily   Or   thiamine  100 mg Intravenous Daily   venlafaxine XR  75 mg Oral Q breakfast   Continuous Infusions:     LOS: 2 days    Time spent: 55 minutes    Leslee Home, MD [facility]   To contact the attending provider between 7A-7P or  the covering provider during after hours 7P-7A, please log into the web site [email or web address] and access using universal [place] password for that web site. If you do not have the password, please call the hospital operator.  03/05/2022, 11:18 AM

## 2022-03-06 ENCOUNTER — Encounter (HOSPITAL_COMMUNITY)
Admission: EM | Disposition: A | Discharge status: Home / Self Care | Payer: Self-pay | Source: Ambulatory Visit | Attending: Internal Medicine

## 2022-03-06 ENCOUNTER — Inpatient Hospital Stay (HOSPITAL_COMMUNITY): Payer: BC Managed Care – PPO | Admitting: Certified Registered"

## 2022-03-06 ENCOUNTER — Inpatient Hospital Stay (HOSPITAL_COMMUNITY): Payer: BC Managed Care – PPO

## 2022-03-06 ENCOUNTER — Encounter (HOSPITAL_COMMUNITY): Payer: Self-pay | Admitting: Family Medicine

## 2022-03-06 ENCOUNTER — Other Ambulatory Visit: Payer: Self-pay

## 2022-03-06 DIAGNOSIS — R55 Syncope and collapse: Secondary | ICD-10-CM | POA: Diagnosis not present

## 2022-03-06 HISTORY — PX: ANTERIOR CERVICAL DECOMP/DISCECTOMY FUSION: SHX1161

## 2022-03-06 LAB — TYPE AND SCREEN
ABO/RH(D): A POS
Antibody Screen: NEGATIVE

## 2022-03-06 LAB — CBC
HCT: 41.5 % (ref 36.0–46.0)
Hemoglobin: 14.5 g/dL (ref 12.0–15.0)
MCH: 34.5 pg — ABNORMAL HIGH (ref 26.0–34.0)
MCHC: 34.9 g/dL (ref 30.0–36.0)
MCV: 98.8 fL (ref 80.0–100.0)
Platelets: 331 10*3/uL (ref 150–400)
RBC: 4.2 MIL/uL (ref 3.87–5.11)
RDW: 13.3 % (ref 11.5–15.5)
WBC: 13 10*3/uL — ABNORMAL HIGH (ref 4.0–10.5)
nRBC: 0 % (ref 0.0–0.2)

## 2022-03-06 LAB — BASIC METABOLIC PANEL
Anion gap: 11 (ref 5–15)
BUN: 17 mg/dL (ref 6–20)
CO2: 25 mmol/L (ref 22–32)
Calcium: 8.9 mg/dL (ref 8.9–10.3)
Chloride: 105 mmol/L (ref 98–111)
Creatinine, Ser: 0.77 mg/dL (ref 0.44–1.00)
GFR, Estimated: >60 mL/min (ref >60)
Glucose, Bld: 125 mg/dL — ABNORMAL HIGH (ref 70–99)
Potassium: 4.6 mmol/L (ref 3.5–5.1)
Sodium: 141 mmol/L (ref 135–145)

## 2022-03-06 LAB — VITAMIN B1: Vitamin B1 (Thiamine): 108.3 nmol/L (ref 66.5–200.0)

## 2022-03-06 LAB — ABO/RH: ABO/RH(D): A POS

## 2022-03-06 LAB — PROTIME-INR
INR: 0.9 (ref 0.8–1.2)
Prothrombin Time: 12.5 seconds (ref 11.4–15.2)

## 2022-03-06 LAB — APTT: aPTT: 22 seconds — ABNORMAL LOW (ref 24–36)

## 2022-03-06 LAB — GLUCOSE, CAPILLARY: Glucose-Capillary: 134 mg/dL — ABNORMAL HIGH (ref 70–99)

## 2022-03-06 SURGERY — ANTERIOR CERVICAL DECOMPRESSION/DISCECTOMY FUSION 2 LEVEL/HARDWARE REMOVAL
Anesthesia: General | Site: Spine Cervical

## 2022-03-06 MED ORDER — PHENYLEPHRINE HCL-NACL 20-0.9 MG/250ML-% IV SOLN
INTRAVENOUS | Status: DC | PRN
  Administered 2022-03-06: 30 ug/min via INTRAVENOUS

## 2022-03-06 MED ORDER — EPHEDRINE SULFATE-NACL 50-0.9 MG/10ML-% IV SOSY
PREFILLED_SYRINGE | INTRAVENOUS | Status: DC | PRN
  Administered 2022-03-06: 5 mg via INTRAVENOUS

## 2022-03-06 MED ORDER — ACETAMINOPHEN 500 MG PO TABS
ORAL_TABLET | ORAL | Status: AC
  Filled 2022-03-06: qty 2

## 2022-03-06 MED ORDER — MENTHOL 3 MG MT LOZG
1.0000 | LOZENGE | OROMUCOSAL | Status: DC | PRN
  Filled 2022-03-06: qty 9

## 2022-03-06 MED ORDER — EPHEDRINE 5 MG/ML INJ
INTRAVENOUS | Status: AC
Start: 2022-03-06 — End: ?
  Filled 2022-03-06: qty 5

## 2022-03-06 MED ORDER — FENTANYL CITRATE (PF) 100 MCG/2ML IJ SOLN
INTRAMUSCULAR | Status: AC
  Filled 2022-03-06: qty 2

## 2022-03-06 MED ORDER — CHLORHEXIDINE GLUCONATE 0.12 % MT SOLN: 15.0000 mL | Freq: Once | OROMUCOSAL | Status: AC

## 2022-03-06 MED ORDER — PROPOFOL 10 MG/ML IV BOLUS
INTRAVENOUS | Status: DC | PRN
  Administered 2022-03-06: 160 mg via INTRAVENOUS
  Administered 2022-03-06: 20 mg via INTRAVENOUS

## 2022-03-06 MED ORDER — METHOCARBAMOL 500 MG PO TABS
500.0000 mg | ORAL_TABLET | Freq: Four times a day (QID) | ORAL | Status: DC | PRN
  Administered 2022-03-07 – 2022-03-08 (×4): 500 mg via ORAL
  Filled 2022-03-06 (×4): qty 1

## 2022-03-06 MED ORDER — METHOCARBAMOL 1000 MG/10ML IJ SOLN: 500.0000 mg | Freq: Four times a day (QID) | INTRAVENOUS | Status: DC | PRN

## 2022-03-06 MED ORDER — FENTANYL CITRATE (PF) 100 MCG/2ML IJ SOLN
50.0000 ug | Freq: Once | INTRAMUSCULAR | Status: AC
  Administered 2022-03-06: 50 ug via INTRAVENOUS

## 2022-03-06 MED ORDER — ONDANSETRON HCL 4 MG/2ML IJ SOLN
INTRAMUSCULAR | Status: AC
  Filled 2022-03-06: qty 2

## 2022-03-06 MED ORDER — FENTANYL CITRATE (PF) 100 MCG/2ML IJ SOLN
25.0000 ug | INTRAMUSCULAR | Status: DC | PRN
  Administered 2022-03-06 (×2): 25 ug via INTRAVENOUS
  Administered 2022-03-06 (×2): 50 ug via INTRAVENOUS

## 2022-03-06 MED ORDER — ONDANSETRON HCL 4 MG/2ML IJ SOLN
INTRAMUSCULAR | Status: DC | PRN
  Administered 2022-03-06: 4 mg via INTRAVENOUS

## 2022-03-06 MED ORDER — 0.9 % SODIUM CHLORIDE (POUR BTL) OPTIME
TOPICAL | Status: DC | PRN
  Administered 2022-03-06: 1000 mL

## 2022-03-06 MED ORDER — FENTANYL CITRATE (PF) 250 MCG/5ML IJ SOLN
INTRAMUSCULAR | Status: DC | PRN
Start: 2022-03-06 — End: 2022-03-06
  Administered 2022-03-06 (×2): 50 ug via INTRAVENOUS
  Administered 2022-03-06: 100 ug via INTRAVENOUS
  Administered 2022-03-06: 50 ug via INTRAVENOUS

## 2022-03-06 MED ORDER — PHENOL 1.4 % MT LIQD: 1.0000 | OROMUCOSAL | Status: DC | PRN

## 2022-03-06 MED ORDER — LIDOCAINE 2% (20 MG/ML) 5 ML SYRINGE
INTRAMUSCULAR | Status: DC | PRN
  Administered 2022-03-06: 80 mg via INTRAVENOUS
  Administered 2022-03-06: 20 mg via INTRAVENOUS

## 2022-03-06 MED ORDER — MIDAZOLAM HCL 2 MG/2ML IJ SOLN
INTRAMUSCULAR | Status: DC | PRN
  Administered 2022-03-06: 2 mg via INTRAVENOUS

## 2022-03-06 MED ORDER — OXYCODONE HCL 5 MG PO TABS
10.0000 mg | ORAL_TABLET | ORAL | Status: DC | PRN
  Administered 2022-03-07 – 2022-03-09 (×12): 10 mg via ORAL
  Filled 2022-03-06 (×12): qty 2

## 2022-03-06 MED ORDER — SODIUM CHLORIDE 0.9% FLUSH: 3.0000 mL | INTRAVENOUS | Status: DC | PRN

## 2022-03-06 MED ORDER — MIDAZOLAM HCL 2 MG/2ML IJ SOLN
INTRAMUSCULAR | Status: AC
Start: 2022-03-06 — End: ?
  Filled 2022-03-06: qty 2

## 2022-03-06 MED ORDER — ONDANSETRON HCL 4 MG/2ML IJ SOLN: 4.0000 mg | Freq: Once | INTRAMUSCULAR | Status: DC | PRN

## 2022-03-06 MED ORDER — ORAL CARE MOUTH RINSE: 15.0000 mL | Freq: Once | OROMUCOSAL | Status: AC

## 2022-03-06 MED ORDER — SODIUM CHLORIDE 0.9 % IV SOLN: INTRAVENOUS | Status: AC

## 2022-03-06 MED ORDER — CEFAZOLIN SODIUM-DEXTROSE 2-4 GM/100ML-% IV SOLN
2.0000 g | INTRAVENOUS | Status: AC
Start: 2022-03-06 — End: 2022-03-06
  Administered 2022-03-06: 2 g via INTRAVENOUS
  Filled 2022-03-06 (×2): qty 100

## 2022-03-06 MED ORDER — SUGAMMADEX SODIUM 200 MG/2ML IV SOLN
INTRAVENOUS | Status: DC | PRN
  Administered 2022-03-06: 200 mg via INTRAVENOUS

## 2022-03-06 MED ORDER — SODIUM CHLORIDE 0.9% FLUSH
3.0000 mL | Freq: Two times a day (BID) | INTRAVENOUS | Status: DC
  Administered 2022-03-06 – 2022-03-09 (×5): 3 mL via INTRAVENOUS

## 2022-03-06 MED ORDER — ACETAMINOPHEN 500 MG PO TABS
1000.0000 mg | ORAL_TABLET | Freq: Once | ORAL | Status: AC
  Administered 2022-03-06: 1000 mg via ORAL

## 2022-03-06 MED ORDER — THROMBIN 5000 UNITS EX SOLR: OROMUCOSAL | Status: DC | PRN

## 2022-03-06 MED ORDER — OXYCODONE HCL 5 MG PO TABS
5.0000 mg | ORAL_TABLET | ORAL | Status: DC | PRN
  Administered 2022-03-07: 5 mg via ORAL
  Filled 2022-03-06 (×3): qty 1

## 2022-03-06 MED ORDER — LACTATED RINGERS IV SOLN: INTRAVENOUS | Status: DC

## 2022-03-06 MED ORDER — GLYCOPYRROLATE 0.2 MG/ML IJ SOLN
INTRAMUSCULAR | Status: DC | PRN
  Administered 2022-03-06: .1 mg via INTRAVENOUS

## 2022-03-06 MED ORDER — FENTANYL CITRATE (PF) 250 MCG/5ML IJ SOLN
INTRAMUSCULAR | Status: AC
  Filled 2022-03-06: qty 5

## 2022-03-06 MED ORDER — ROCURONIUM BROMIDE 10 MG/ML (PF) SYRINGE
PREFILLED_SYRINGE | INTRAVENOUS | Status: DC | PRN
  Administered 2022-03-06: 30 mg via INTRAVENOUS
  Administered 2022-03-06: 50 mg via INTRAVENOUS

## 2022-03-06 MED ORDER — DEXAMETHASONE SODIUM PHOSPHATE 10 MG/ML IJ SOLN
INTRAMUSCULAR | Status: DC | PRN
  Administered 2022-03-06: 10 mg via INTRAVENOUS

## 2022-03-06 MED ORDER — CHLORHEXIDINE GLUCONATE 0.12 % MT SOLN
OROMUCOSAL | Status: AC
  Administered 2022-03-06: 15 mL via OROMUCOSAL
  Filled 2022-03-06: qty 15

## 2022-03-06 MED ORDER — CEFAZOLIN SODIUM-DEXTROSE 2-4 GM/100ML-% IV SOLN
2.0000 g | Freq: Three times a day (TID) | INTRAVENOUS | Status: AC
  Administered 2022-03-06 – 2022-03-07 (×2): 2 g via INTRAVENOUS
  Filled 2022-03-06 (×2): qty 100

## 2022-03-06 MED ORDER — TRIAMCINOLONE ACETONIDE 40 MG/ML IJ SUSP
INTRAMUSCULAR | Status: AC
  Filled 2022-03-06: qty 5

## 2022-03-06 MED ORDER — THROMBIN 5000 UNITS EX SOLR
CUTANEOUS | Status: AC
Start: 2022-03-06 — End: ?
  Filled 2022-03-06: qty 5000

## 2022-03-06 MED ORDER — SODIUM CHLORIDE 0.9 % IV SOLN
250.0000 mL | INTRAVENOUS | Status: DC
  Administered 2022-03-06: 250 mL via INTRAVENOUS

## 2022-03-06 SURGICAL SUPPLY — 60 items
APL SKNCLS STERI-STRIP NONHPOA (GAUZE/BANDAGES/DRESSINGS) ×1
BAG COUNTER SPONGE SURGICOUNT (BAG) ×3 IMPLANT
BAG SPNG CNTER NS LX DISP (BAG) ×2
BAND INSRT 18 STRL LF DISP RB (MISCELLANEOUS) ×2
BAND RUBBER #18 3X1/16 STRL (MISCELLANEOUS) ×4 IMPLANT
BENZOIN TINCTURE PRP APPL 2/3 (GAUZE/BANDAGES/DRESSINGS) ×1 IMPLANT
BIT DRILL NEURO 2X3.1 SFT TUCH (MISCELLANEOUS) ×1 IMPLANT
BUR CARBIDE MATCH 3.0 (BURR) ×3 IMPLANT
CANISTER SUCT 3000ML PPV (MISCELLANEOUS) ×2 IMPLANT
CARTRIDGE OIL MAESTRO DRILL (MISCELLANEOUS) ×1 IMPLANT
COVER MAYO STAND STRL (DRAPES) ×4 IMPLANT
DRAPE C-ARM 42X72 X-RAY (DRAPES) ×2 IMPLANT
DRAPE HALF SHEET 40X57 (DRAPES) ×1 IMPLANT
DRAPE LAPAROTOMY 100X72X124 (DRAPES) ×2 IMPLANT
DRAPE MICROSCOPE LEICA (MISCELLANEOUS) ×2 IMPLANT
DRILL NEURO 2X3.1 SOFT TOUCH (MISCELLANEOUS) ×2
DRSG OPSITE POSTOP 4X6 (GAUZE/BANDAGES/DRESSINGS) ×1 IMPLANT
DURAPREP 6ML APPLICATOR 50/CS (WOUND CARE) ×2 IMPLANT
ELECT COATED BLADE 2.86 ST (ELECTRODE) ×2 IMPLANT
ELECT REM PT RETURN 9FT ADLT (ELECTROSURGICAL) ×2
ELECTRODE REM PT RTRN 9FT ADLT (ELECTROSURGICAL) ×1 IMPLANT
EVACUATOR 1/8 PVC DRAIN (DRAIN) ×1 IMPLANT
GLOVE BIOGEL PI IND STRL 8 (GLOVE) ×1 IMPLANT
GLOVE BIOGEL PI INDICATOR 8 (GLOVE) ×2
GLOVE ECLIPSE 8.0 STRL XLNG CF (GLOVE) ×4 IMPLANT
GLOVE SURG ENC MOIS LTX SZ8 (GLOVE) ×3 IMPLANT
GLOVE SURG UNDER POLY LF SZ8.5 (GLOVE) ×3 IMPLANT
GOWN STRL REUS W/ TWL LRG LVL3 (GOWN DISPOSABLE) IMPLANT
GOWN STRL REUS W/ TWL XL LVL3 (GOWN DISPOSABLE) ×2 IMPLANT
GOWN STRL REUS W/TWL 2XL LVL3 (GOWN DISPOSABLE) IMPLANT
GOWN STRL REUS W/TWL LRG LVL3 (GOWN DISPOSABLE) ×4
GOWN STRL REUS W/TWL XL LVL3 (GOWN DISPOSABLE) ×4
HEMOSTAT POWDER KIT SURGIFOAM (HEMOSTASIS) ×2 IMPLANT
IMPL CERV LORD 12X14X6 7D (Neuro Prosthesis/Implant) IMPLANT
IMPLANT CERV LORD 12X14X6 7D (Neuro Prosthesis/Implant) ×2 IMPLANT
KIT BASIN OR (CUSTOM PROCEDURE TRAY) ×2 IMPLANT
KIT TURNOVER KIT B (KITS) ×2 IMPLANT
NDL SPNL 18GX3.5 QUINCKE PK (NEEDLE) ×1 IMPLANT
NEEDLE HYPO 22GX1.5 SAFETY (NEEDLE) ×2 IMPLANT
NEEDLE SPNL 18GX3.5 QUINCKE PK (NEEDLE) ×2 IMPLANT
NS IRRIG 1000ML POUR BTL (IV SOLUTION) ×2 IMPLANT
OIL CARTRIDGE MAESTRO DRILL (MISCELLANEOUS) ×2
PACK LAMINECTOMY NEURO (CUSTOM PROCEDURE TRAY) ×2 IMPLANT
PAD ARMBOARD 7.5X6 YLW CONV (MISCELLANEOUS) ×6 IMPLANT
PIN DISTRACTION 14MM (PIN) ×4 IMPLANT
PLATE CERV CONS OZARK 3X54 (Plate) ×1 IMPLANT
PUTTY BONE 100 VESUVIUS 2.5CC (Putty) ×1 IMPLANT
RASP HELIOCORDIAL MED (MISCELLANEOUS) ×1 IMPLANT
SCREW FIXED ST OZARK 4X16 (Screw) ×2 IMPLANT
SCREW VA ST OZARK 4X14 (Screw) ×6 IMPLANT
SPACER LORD CASC 14X12X7 7D (Spacer) ×1 IMPLANT
SPONGE INTESTINAL PEANUT (DISPOSABLE) ×3 IMPLANT
SPONGE SURGIFOAM ABS GEL SZ50 (HEMOSTASIS) ×2 IMPLANT
STAPLER VISISTAT 35W (STAPLE) ×1 IMPLANT
STRIP CLOSURE SKIN 1/2X4 (GAUZE/BANDAGES/DRESSINGS) ×1 IMPLANT
TAPE SURG TRANSPORE 1 IN (GAUZE/BANDAGES/DRESSINGS) ×1 IMPLANT
TAPE SURGICAL TRANSPORE 1 IN (GAUZE/BANDAGES/DRESSINGS) ×2
TOWEL GREEN STERILE (TOWEL DISPOSABLE) ×2 IMPLANT
TOWEL GREEN STERILE FF (TOWEL DISPOSABLE) ×2 IMPLANT
WATER STERILE IRR 1000ML POUR (IV SOLUTION) ×2 IMPLANT

## 2022-03-06 NOTE — Plan of Care (Signed)
  Problem: Education: Goal: Knowledge of General Education information will improve Description: Including pain rating scale, medication(s)/side effects and non-pharmacologic comfort measures Outcome: Progressing   Problem: Health Behavior/Discharge Planning: Goal: Ability to manage health-related needs will improve Outcome: Progressing   Problem: Clinical Measurements: Goal: Ability to maintain clinical measurements within normal limits will improve Outcome: Progressing Goal: Will remain free from infection Outcome: Progressing Goal: Diagnostic test results will improve Outcome: Progressing Goal: Cardiovascular complication will be avoided Outcome: Progressing   Problem: Activity: Goal: Risk for activity intolerance will decrease Outcome: Progressing   Problem: Nutrition: Goal: Adequate nutrition will be maintained Outcome: Progressing   Problem: Coping: Goal: Level of anxiety will decrease Outcome: Progressing   Problem: Elimination: Goal: Will not experience complications related to bowel motility Outcome: Progressing Goal: Will not experience complications related to urinary retention Outcome: Progressing   Problem: Pain Managment: Goal: General experience of comfort will improve Outcome: Progressing

## 2022-03-06 NOTE — Progress Notes (Signed)
  Transition of Care [facility]) Screening Note   Patient Details  Name: Alexis English Date of Birth: Aug 11, 1966   [facility]) CM/SW Contact:    Pollie Friar, RN Phone Number: 03/06/2022, 1:49 PM   To to have:  OR today for ACDF C3-4, C5-6. He is from home with family. Awaiting therapy evals post surgery to help determine pts needs.  Transition of Care Department [facility]) has reviewed patient. We will continue to monitor patient advancement through interdisciplinary progression rounds. If new patient transition needs arise, please place a TOC consult.

## 2022-03-06 NOTE — Op Note (Signed)
Providing Compassionate, Quality Care - Together  Date of service: 03/06/2022  PREOP DIAGNOSIS: Cervical spondylosis, stenosis with myelopathy C3-4 with cord signal change; moderate to severe stenosis C5-6; central cord syndrome  POSTOP DIAGNOSIS: Same  PROCEDURE: 1. Arthrodesis C3-4, C5-6, anterior interbody technique  2. Placement of intervertebral biomechanical device C3-4: 7 x 12 x 14 mm titanium interbody, K2 M [place], C5-6: 6 x 12 x 14 mm titanium interbody, K2 M [place] 3. Placement of anterior instrumentation consisting of interbody plate and screws -4.0 x 14 mm bilaterally at C3, 4.0 x 14 mm bilaterally at C4, 4.0 x 14 mm bilaterally at C5, 4.0 x 16 mm bilaterally at C6; [place] plate-54 mm 4. Exploration of fusion, C4-5 with removal of Medtronic plate 5. Discectomy at C3-4, C5-6 for decompression of spinal cord and exiting nerve roots  6. Use of morselized bone allograft  7.  Use of autograft, same incision 8. Use of intraoperative microscope  SURGEON: Dr. Pieter Partridge Hisako Bugh, DO  ASSISTANT: None  ANESTHESIA: General Endotracheal  EBL: 100 cc  SPECIMENS: None  DRAINS: None  COMPLICATIONS: None immediate  CONDITION: Hemodynamically stable to PACU  HISTORY: HELAYNA DUN is a 56 y.o. y.o. female who presented to the emergency department with neck pain and bilateral upper and lower extremity weakness after a fall and syncopal event. Cardiac workup was negative. MRI revealed severe stenosis with cord signal change at C3-4, prior anterior cervical fusion at C4-5, moderate to severe stenosis at C5-6 with cord abutment.  Physical exam showed bilateral upper and lower extremity slight weakness with burning dysesthetic type pain in her bilateral upper extremities consistent with central cord syndrome.  I offered her surgical intervention in the form of ACDF C3-4, C5-6 with removal of hardware at C4-5.  We discussed all risks, benefits and expected outcomes as well as  alternatives to treatment.  Informed consent was obtained.  PROCEDURE IN DETAIL: The patient was brought to the operating room and transferred to the operative table. After induction of general anesthesia, the patient was positioned on the operative table in the supine position with all pressure points meticulously padded. The skin of the neck was then prepped and draped in the usual sterile fashion.  Physician driven timeout was performed.  After timeout was conducted, skin incision was then made sharply with a 10 blade and Bovie electrocautery was used to dissect the subcutaneous tissue until the platysma was identified. The platysma was then divided and undermined. The sternocleidomastoid muscle was then identified and, utilizing natural fascial planes in the neck, the prevertebral fascia was identified and the carotid sheath was retracted laterally and the trachea and esophagus retracted medially.  There was some scarring noted along the lateral pharyngeal space due to prior ACDF.  Again using fluoroscopy, the correct disc space was identified.  The prior plate was identified and using [place], gently swept dissected free from the prevertebral space soft tissue.  Using Bovie electrocautery section in the subperiosteal plane and elevate the bilateral longus coli muscles was performed bilaterally at C3, C4, C5, C6. Self-retaining retractors were then placed under the longus coli muscles bilaterally. At this point, the microscope was draped and brought into the field, and the remainder of the case was done under the microscope using microdissecting technique.  The prior plate was identified, the locking mechanism was disengaged superiorly and inferiorly and then the 4 screws were removed without difficulty.  The plate was then gently removed.  One of the inferior screws appeared  to violate the disc space at C5-6 at this level.  The C4-5 level appeared appropriately fused.  ACDF C3-4: Distraction pins were  placed in midline above and below the disc space at C3-4.  The disc  space was placed in distraction.  The disc space was incised sharply and rongeurs were use to initially complete a discectomy. The high-speed drill was then used to complete discectomy until the posterior annulus was identified and removed and the posterior longitudinal ligament was identified.  Posterior osteophytes were removed with a high-speed drill superiorly and inferiorly.  Using microcurettes, the PLL was elevated, and [place] rongeurs were used to remove the posterior longitudinal ligament and the ventral thecal sac was identified. Using a combination of curettes and ronguers, complete decompression of the thecal sac and exiting nerve roots at this level was completed, and verified using micro-nerve hook. The disc space was taken out of distraction.  There was significant multiple small disc herniations noted at this level.  Epidural hemostasis was achieved with Surgifoam.  The thecal sac was pulsatile.  Bilateral neuroforamen were felt with microhook and noted to be appropriately decompressed. Having completed our decompression, attention was turned to placement of the intervertebral device. Trial spacers were used to select a 7 mm graft. This graft was then filled with morcellized allograft, and autograft and inserted under live fluoroscopy.  ACDF C 5-6: Distraction pins were placed in midline above and below the disc space at C 5-6.  The disc  space was placed in distraction.  The disc space was incised sharply and rongeurs were use to initially complete a discectomy. The high-speed drill was then used to complete discectomy until the posterior annulus was identified and removed and the posterior longitudinal ligament was identified.  Posterior osteophytes were removed with a high-speed drill superiorly and inferiorly.  Using microcurettes, the PLL was elevated, and [place] rongeurs were used to remove the posterior longitudinal  ligament and the ventral thecal sac was identified. Using a combination of curettes and ronguers, complete decompression of the thecal sac and exiting nerve roots at this level was completed, and verified using micro-nerve hook. The disc space was taken out of distraction.  Epidural hemostasis was achieved with Surgifoam.  The thecal sac was pulsatile.  Bilateral neuroforamen were felt with microhook and noted to be appropriately decompressed. Having completed our decompression, attention was turned to placement of the intervertebral device. Trial spacers were used to select a 6 mm graft. This graft was then filled with morcellized allograft, and autograft and inserted under live fluoroscopy.  Distraction pins were removed.  Hemostasis was achieved with Surgifoam. After placement of the intervertebral devices, the above anterior cervical plate was selected, and placed across the interspaces. Using a high-speed drill, the cortex of the cervical vertebral bodies was punctured at C3, and screws inserted with appropriate bony purchase.  This was then repeated at C6, with fixed angle screws at C6.  I then used the prior screw trajectories for screws at C4, and created new trajectories with a high-speed drill at C5 bilaterally.  Final fluoroscopic images in AP and lateral projections were taken to confirm good hardware placement.  The plate was final tightened to the manufacturer's recommendation and the screws were locked in place.  At this point, after all counts were verified to be correct, meticulous hemostasis was secured using a combination of bipolar electrocautery and passive hemostatics.  A medium Hemovac was tunneled laterally and placed in the prevertebral space.  Skin was closed with  staples.  Sterile dressing was applied.  The patient tolerated the procedure well and was extubated in the room and taken to the postanesthesia care unit in stable condition.

## 2022-03-06 NOTE — Progress Notes (Signed)
   Providing Compassionate, Quality Care - Together  NEUROSURGERY PROGRESS NOTE   S: No issues overnight. NPO at 9am  O: EXAM:  BP 129/82 (BP Location: Right Arm)   Pulse (!) 57   Temp 98.4 F (36.9 C) (Oral)   Resp 19   Ht '5\' 3"'$  (1.6 m)   Wt 55.7 kg   LMP 03/21/2012   SpO2 100%   BMI 21.75 kg/m   Awake, alert, oriented x3 PERRL Speech fluent, appropriate  CNs grossly intact  4/5 BUE/BLE  + hoffmans clonus in BLE  ASSESSMENT:  56 y.o. female with   C3-4, C5-6 severe stenosis with myelopathy and central cord syndrome  PLAN: - OR today for ACDF C3-4, C5-6, we discussed all risks, benefits and expected outcomes. I answered all of her questions.    Thank you for allowing me to participate in this patient's care.  Please do not hesitate to call with questions or concerns.   Elwin Sleight, [facility] Cell: [phone]

## 2022-03-06 NOTE — Progress Notes (Signed)
Orthopedic Tech Progress Note Patient Details:  Alexis English November 22, 1965 [id number]  Patient ID: Alexis English, female   DOB: 26-Feb-1966, 56 y.o.   MRN: [id number] Aspen collar dropped off with PACU RN.  Alexis English 03/06/2022, 9:22 PM

## 2022-03-06 NOTE — Anesthesia Procedure Notes (Signed)
Procedure Name: Intubation Date/Time: 03/06/2022 5:42 PM  Performed by: Griffin Dakin, CRNAPre-anesthesia Checklist: Patient identified, Emergency Drugs available, Suction available and Patient being monitored Patient Re-evaluated:Patient Re-evaluated prior to induction Oxygen Delivery Method: Circle system utilized Preoxygenation: Pre-oxygenation with 100% oxygen Induction Type: IV induction Ventilation: Mask ventilation without difficulty Laryngoscope Size: Glidescope and 3 Grade View: Grade I Tube type: Oral Tube size: 7.0 mm Number of attempts: 1 Airway Equipment and Method: Rigid stylet and Video-laryngoscopy Placement Confirmation: ETT inserted through vocal cords under direct vision, positive ETCO2 and breath sounds checked- equal and bilateral Secured at: 22 cm Tube secured with: Tape Dental Injury: Teeth and Oropharynx as per pre-operative assessment

## 2022-03-06 NOTE — Anesthesia Preprocedure Evaluation (Signed)
Anesthesia Evaluation  Patient identified by MRN, date of birth, ID band Patient awake    Reviewed: Allergy & Precautions, NPO status , Patient's Chart, lab work & pertinent test results  Airway Mallampati: II  TM Distance: >3 FB Neck ROM: Full    Dental  (+) Teeth Intact, Dental Advisory Given   Pulmonary Current Smoker,    Pulmonary exam normal breath sounds clear to auscultation       Cardiovascular hypertension, Pt. on medications Normal cardiovascular exam Rhythm:Regular Rate:Normal     Neuro/Psych PSYCHIATRIC DISORDERS Anxiety Depression Cervical stenosis  Neuromuscular disease    GI/Hepatic negative GI ROS, Neg liver ROS,   Endo/Other  negative endocrine ROS  Renal/GU negative Renal ROS     Musculoskeletal negative musculoskeletal ROS (+)   Abdominal   Peds  Hematology negative hematology ROS (+)   Anesthesia Other Findings Day of surgery medications reviewed with the patient.  Reproductive/Obstetrics                             Anesthesia Physical Anesthesia Plan  ASA: 2  Anesthesia Plan: General   Post-op Pain Management: Tylenol PO (pre-op)*   Induction: Intravenous  PONV Risk Score and Plan:   Airway Management Planned: Oral ETT and Video Laryngoscope Planned  Additional Equipment:   Intra-op Plan:   Post-operative Plan: Extubation in OR  Informed Consent: I have reviewed the patients History and Physical, chart, labs and discussed the procedure including the risks, benefits and alternatives for the proposed anesthesia with the patient or authorized representative who has indicated his/her understanding and acceptance.     Dental advisory given  Plan Discussed with: CRNA  Anesthesia Plan Comments:         Anesthesia Quick Evaluation

## 2022-03-06 NOTE — Transfer of Care (Signed)
Immediate Anesthesia Transfer of Care Note  Patient: Alexis English  Procedure(s) Performed: CERVICAL THREE-FOUR, CERVICAL FIVE-SIX ANTERIOR CERVICAL DECOMPRESSION/DISCECTOMY FUSION WITH REMOVAL OF CERVICAL FOUR-FIVE PLATE (Spine Cervical)  Patient Location: PACU  Anesthesia Type:General  Level of Consciousness: awake, alert  and oriented  Airway & Oxygen Therapy: Patient Spontanous Breathing  Post-op Assessment: Report given to RN, Post -op Vital signs reviewed and stable and Patient moving all extremities X 4  Post vital signs: Reviewed and stable  Last Vitals:  Vitals Value Taken Time  BP 134/83 03/06/22 2049  Temp    Pulse 91 03/06/22 2051  Resp 16 03/06/22 2051  SpO2 98 % 03/06/22 2051  Vitals shown include unvalidated device data.  Last Pain:  Vitals:   03/06/22 1601  TempSrc:   PainSc: 0-No pain         Complications: No notable events documented.

## 2022-03-06 NOTE — Progress Notes (Signed)
Progress Note:    Alexis English    GXQ:[id number] DOB: 19-Feb-1966 DOA: 03/02/2022  PCP: Debbrah Alar, NP    Brief Narrative:   This 56 y.o. female with PMH significant for hypertension, depression, anxiety, and C4-5 fusion in [place] more than 20 years ago, presented in the ED after 2 syncopal episodes.  Patient reports she was in her usual state then suddenly she became acutely lightheaded and lost consciousness shortly after standing.  She began to experience severe pain in upper back both shoulders and both arms and has difficulty getting up due to weakness involving arms and legs.  She was able to crawl to her bed but had difficulty sleeping due to the pain.  Work-up in the ED CT head negative for acute intracranial abnormality.  CT C-spine negative for subluxation or fracture.  MRI C.Spine showed severe stenosis at C3 and C4 and C5 and C6.  Patient is admitted for further evaluation.  Patient was seen by neurosurgery,  recommended Neurosurgical intervention on Monday.  Patient started on Decadron 4 mg every 6 hours.    Subjective:   Seen and evaluated this morning. Pain controlled. No new complaints   Assessment and Plan:   Syncope: Likely secondary to cervical spinal stenosis and consistent with central cord syndrome.  Neurosurgery consulted and following.  Neurosurgical intervention planned for later today.  Continue Decadron and gabapentin for pain control.  Continue current pain scale. Continue muscle relaxer. ECHO unremarkable. No arrhythmogenic events on telemetry. Will place pt/ot consult for tomorrow  Cervical spinal stenosis of C3-4, and C5-6 with myelopathy and central cord syndrome: Plan as above  Essential hypertension: Continue home Norvasc 10 mg daily  Depression/anxiety: Continue home Effexor 75 mg daily  Tobacco use disorder: Counseled on smoking cessation. NRT with nicorette gum  Alcohol dependence: Patient drinks daily usually 3 glasses  of wine.  Denies any history of alcohol withdrawal.  Continue CIWA protocol. MV, thiamine, folic acid.  Other information:    DVT prophylaxis: SCDs Code Status: Full Family Communication: None present at bedside Disposition:   Status is: Inpatient Remains inpatient appropriate because: Neurosurgical intervention on Monday     Consultants:   Neurosurgery    Objective:    Vitals:   03/05/22 2027 03/06/22 0500 03/06/22 0621 03/06/22 0748  BP: 112/75  132/89 129/82  Pulse: 65   (!) 57  Resp:    19  Temp: 98.9 F (37.2 C)  98.1 F (36.7 C) 98.4 F (36.9 C)  TempSrc: Oral  Oral Oral  SpO2: 99%  97% 100%  Weight:  55.7 kg    Height:       No intake or output data in the 24 hours ending 03/06/22 1114  Filed Weights   03/04/22 0500 03/05/22 0500 03/06/22 0500  Weight: 56.2 kg 56.2 kg 55.7 kg       Physical Exam:    General exam: Appears calm and comfortable  Respiratory system: Clear to auscultation. Respiratory effort normal. Cardiovascular system: S1 & S2 heard, RRR. No JVD, murmurs, rubs, gallops or clicks. No pedal edema. Gastrointestinal system: Abdomen is nondistended, soft and nontender. No organomegaly or masses felt. Normal bowel sounds heard. Central nervous system: Alert and oriented. No focal neurological deficits. Extremities: Symmetric 5 x 5 power. Skin: No rashes, lesions or ulcers Psychiatry: Judgement and insight appear normal. Mood & affect appropriate.     Data Reviewed:    I have personally reviewed following labs and imaging studies  CBC: Recent Labs  Lab 03/02/22 1239 03/03/22 0414 03/06/22 0400  WBC 13.3* 7.4 13.0*  HGB 15.4* 14.3 14.5  HCT 43.4 41.2 41.5  MCV 96.0 96.7 98.8  PLT 386 330 132    Basic Metabolic Panel: Recent Labs  Lab 03/02/22 1239 03/03/22 0414 03/06/22 0400  NA 137 141 141  K 3.8 3.5 4.6  CL 103 108 105  CO2 '22 26 25  '$ GLUCOSE 91 114* 125*  BUN '9 8 17  '$ CREATININE 0.59 0.62 0.77  CALCIUM 9.5  8.7* 8.9    GFR: Estimated Creatinine Clearance: 66.5 mL/min (by C-G formula based on SCr of 0.77 mg/dL).  Liver Function Tests: Recent Labs  Lab 03/02/22 1239  AST 25  ALT 20  ALKPHOS 31*  BILITOT 1.0  PROT 7.2  ALBUMIN 4.6    CBG: Recent Labs  Lab 03/04/22 0525 03/04/22 2223 03/05/22 0508 03/05/22 2159 03/06/22 0619  GLUCAP 148* 160* 139* 152* 134*     Recent Results (from the past 240 hour(s))  Surgical PCR screen     Status: None   Collection Time: 03/03/22  7:00 PM   Specimen: Nasal Mucosa; Nasal Swab  Result Value Ref Range Status   MRSA, PCR NEGATIVE NEGATIVE Final   Staphylococcus aureus NEGATIVE NEGATIVE Final    Comment: (NOTE) The Xpert SA Assay (FDA approved for NASAL specimens in patients 86 years of age and older), is one component of a comprehensive surveillance program. It is not intended to diagnose infection nor to guide or monitor treatment. Performed at [facility], [place] [address], [place], [place] [address]          Radiology Studies:    No results found.      Medications:    Scheduled Meds:  amLODipine  10 mg Oral Daily   cyclobenzaprine  5 mg Oral TID   dexamethasone (DECADRON) injection  4 mg Intravenous U7O   folic acid  1 mg Oral Daily   gabapentin  300 mg Oral TID   LORazepam  0-4 mg Intravenous Q12H   multivitamin with minerals  1 tablet Oral Daily   pantoprazole  40 mg Oral Daily   pneumococcal 20-valent conjugate vaccine  0.5 mL Intramuscular Tomorrow-1000   sodium chloride flush  3 mL Intravenous Q12H   thiamine injection  100 mg Intravenous Daily   thiamine  100 mg Oral Daily   Or   thiamine  100 mg Intravenous Daily   venlafaxine XR  75 mg Oral Q breakfast   Continuous Infusions:     LOS: 3 days    Time spent: 35 minutes    Desma Maxim, MD [facility]   To contact the attending provider between 7A-7P or the covering provider during after hours 7P-7A, please log into the web  site [email or web address] and access using [employer] password for that web site. If you do not have the password, please call the hospital operator.  03/06/2022, 11:14 AM

## 2022-03-07 ENCOUNTER — Encounter (HOSPITAL_COMMUNITY): Payer: Self-pay | Admitting: Neurological Surgery

## 2022-03-07 DIAGNOSIS — R55 Syncope and collapse: Secondary | ICD-10-CM | POA: Diagnosis not present

## 2022-03-07 DIAGNOSIS — M4802 Spinal stenosis, cervical region: Secondary | ICD-10-CM | POA: Diagnosis not present

## 2022-03-07 LAB — GLUCOSE, CAPILLARY: Glucose-Capillary: 129 mg/dL — ABNORMAL HIGH (ref 70–99)

## 2022-03-07 MED ORDER — GABAPENTIN 300 MG PO CAPS
600.0000 mg | ORAL_CAPSULE | Freq: Three times a day (TID) | ORAL | Status: DC
  Administered 2022-03-07 – 2022-03-09 (×6): 600 mg via ORAL
  Filled 2022-03-07 (×6): qty 2

## 2022-03-07 MED ORDER — SENNOSIDES-DOCUSATE SODIUM 8.6-50 MG PO TABS
1.0000 | ORAL_TABLET | Freq: Two times a day (BID) | ORAL | Status: DC
  Administered 2022-03-07 – 2022-03-08 (×3): 1 via ORAL
  Filled 2022-03-07 (×5): qty 1

## 2022-03-07 MED ORDER — POLYETHYLENE GLYCOL 3350 17 G PO PACK
17.0000 g | PACK | Freq: Every day | ORAL | Status: DC
  Administered 2022-03-07: 17 g via ORAL
  Filled 2022-03-07 (×3): qty 1

## 2022-03-07 MED ORDER — DEXAMETHASONE SODIUM PHOSPHATE 4 MG/ML IJ SOLN
4.0000 mg | Freq: Three times a day (TID) | INTRAMUSCULAR | Status: DC
  Administered 2022-03-07 – 2022-03-09 (×5): 4 mg via INTRAVENOUS
  Filled 2022-03-07 (×5): qty 1

## 2022-03-07 NOTE — Progress Notes (Signed)
   Providing Compassionate, Quality Care - Together  NEUROSURGERY PROGRESS NOTE   S: No issues overnight.  Tolerating diet.  O: EXAM:  BP 131/84 (BP Location: Left Arm)   Pulse 63   Temp 98.7 F (37.1 C) (Oral)   Resp 15   Ht '5\' 3"'$  (1.6 m)   Wt 56.2 kg   LMP 03/21/2012   SpO2 98%   BMI 21.95 kg/m   Awake, alert, oriented x3 PERRL Speech fluent, appropriate  CNs grossly intact  Trachea midline, neck soft, normal phonation 4+/5 BUE/BLE  Dressing clean dry and intact  ASSESSMENT:  56 y.o. female with   C3-4, C5-6 severe stenosis with myelopathy and central cord syndrome  PLAN: -Wean Decadron, agree with increase of gabapentin -Drain discontinued -Inpatient rehab recommended, pending placement -Continue pain control    Thank you for allowing me to participate in this patient's care.  Please do not hesitate to call with questions or concerns.   Elwin Sleight, [facility] Cell: [phone]

## 2022-03-07 NOTE — Plan of Care (Signed)
  Problem: Education: Goal: Knowledge of General Education information will improve Description: Including pain rating scale, medication(s)/side effects and non-pharmacologic comfort measures Outcome: Completed/Met   Problem: Health Behavior/Discharge Planning: Goal: Ability to manage health-related needs will improve Outcome: Completed/Met   Problem: Clinical Measurements: Goal: Ability to maintain clinical measurements within normal limits will improve Outcome: Completed/Met Goal: Will remain free from infection Outcome: Completed/Met Goal: Diagnostic test results will improve Outcome: Completed/Met Goal: Cardiovascular complication will be avoided Outcome: Progressing   Problem: Activity: Goal: Risk for activity intolerance will decrease Outcome: Completed/Met   Problem: Nutrition: Goal: Adequate nutrition will be maintained Outcome: Completed/Met   Problem: Coping: Goal: Level of anxiety will decrease Outcome: Progressing

## 2022-03-07 NOTE — Anesthesia Postprocedure Evaluation (Signed)
Anesthesia Post Note  Patient: Alexis English  Procedure(s) Performed: CERVICAL THREE-FOUR, CERVICAL FIVE-SIX ANTERIOR CERVICAL DECOMPRESSION/DISCECTOMY FUSION WITH REMOVAL OF CERVICAL FOUR-FIVE PLATE (Spine Cervical)     Patient location during evaluation: PACU Anesthesia Type: General Level of consciousness: awake and alert Pain management: pain level controlled Vital Signs Assessment: post-procedure vital signs reviewed and stable Respiratory status: spontaneous breathing, nonlabored ventilation, respiratory function stable and patient connected to nasal cannula oxygen Cardiovascular status: blood pressure returned to baseline and stable Postop Assessment: no apparent nausea or vomiting Anesthetic complications: no   No notable events documented.  Last Vitals:  Vitals:   03/06/22 2205 03/06/22 2225  BP: 124/79 123/80  Pulse: 71 66  Resp: 17 20  Temp: 37.2 C 37.7 C  SpO2: 95% 96%    Last Pain:  Vitals:   03/07/22 0014  TempSrc:   PainSc: [place] Jonita Hirota

## 2022-03-07 NOTE — Hospital Course (Signed)
Alexis English is a 56 y.o. female with PMH significant for hypertension, depression, anxiety, and C4-5 fusion in [place] more than 20 years ago who presented to the ED after 2 syncopal episodes.  Patient reports she was in her usual state then suddenly she became acutely lightheaded and lost consciousness shortly after standing.  She began to experience severe pain in upper back both shoulders and both arms and has difficulty getting up due to weakness involving arms and legs.  She was able to crawl to her bed but had difficulty sleeping due to the pain.  Work-up in the ED CT head negative for acute intracranial abnormality.  CT C-spine negative for subluxation or fracture.  MRI C spine showed severe stenosis at C3 and C4 and C5 and C6.  She was evaluated by neurosurgery and underwent discectomy at C3-4, C5-6 for decompression of spinal cord and exiting nerve roots on 03/06/2022.

## 2022-03-07 NOTE — Progress Notes (Signed)
Inpatient Rehab Admissions Coordinator:   Per therapy recommendations,  patient was screened for CIR candidacy by Clemens Catholic, MS, CCC-SLP . At this time, Pt. Appears to be a a potential candidate for CIR. I will place   order for rehab consult per protocol for full assessment. Note, beds are limited and pt. Is progressing quickly. She may progress to being able to go home before CIR has a bed for her. Please contact me any with questions.  Clemens Catholic, [place], [place] Admissions Coordinator  [phone] ([place]) [phone] (office)

## 2022-03-07 NOTE — Evaluation (Signed)
Physical Therapy Evaluation  Patient Details Name: Alexis English MRN: [id number] DOB: [birth year] Today's Date: 03/07/2022  History of Present Illness  Pt is a 56 y/o F presenting to ED on 6/15 with syncope x2, head and cervical spine CT negative, brain MRI negative, cervical spine MRI revealing severe stenosis at C3-4 with chronic evidence of myelomalacia and central cord syndrome. S/p C3-4, C5-6 ACDF, with removal of C4-C5 plate on 54/27/7824. PMH includes anxiety, depression, kidney stones, HTN, PNA, and cervical fusion.   Clinical Impression  Pt admitted with above diagnosis. Pt currently with functional limitations due to the deficits listed below (see PT Problem List). At the time of PT eval, pt was able to demonstrate transfers and ambulation with up to min assist and required max assist to recover from knee buckle during stair training. Pt was educated on precautions, brace application/wearing schedule, appropriate activity progression, and car transfer. She appears to have good social support, including friends that have offered to come stay with her if needed. Feel pt has the potential to progress to a mod I level with continued multidisciplinary therapies. Recommending AIR to maximize functional return. Acutely, pt will benefit from skilled PT to increase their independence and safety with mobility to allow discharge to the venue listed below.         Recommendations for follow up therapy are one component of a multi-disciplinary discharge planning process, led by the attending physician.  Recommendations may be updated based on patient status, additional functional criteria and insurance authorization.  Follow Up Recommendations Acute inpatient rehab (3hours/day)    Assistance Recommended at Discharge Intermittent Supervision/Assistance  Patient can return home with the following  A little help with walking and/or transfers;A little help with bathing/dressing/bathroom;Assistance with  cooking/housework;Assist for transportation;Help with stairs or ramp for entrance    Equipment Recommendations Rolling walker (2 wheels)  Recommendations for Other Services  Rehab consult    Functional Status Assessment Patient has had a recent decline in their functional status and demonstrates the ability to make significant improvements in function in a reasonable and predictable amount of time.     Precautions / Restrictions Precautions Precautions: Cervical Precaution Booklet Issued: Yes (comment) Precaution Comments: educated pt on 3/3 cervical precautions Required Braces or Orthoses: Cervical Brace Cervical Brace: Hard collar;At all times Restrictions Weight Bearing Restrictions: No      Mobility  Bed Mobility Overal bed mobility: Needs Assistance             General bed mobility comments: Pt sitting up in bed with HOB elevated. Was able to swing LE's around to EOB without difficulty. Verbally discussed log roll technique.    Transfers Overall transfer level: Needs assistance Equipment used: None Transfers: Sit to/from Stand Sit to Stand: Min guard           General transfer comment: Hands on guarding provided for safety. No assist required for basic transfer.    Ambulation/Gait Ambulation/Gait assistance: Min guard, Min assist Gait Distance (Feet): 175 Feet Assistive device: Rolling walker (2 wheels) Gait Pattern/deviations: Step-through pattern, Decreased stride length, Knees buckling, Ataxic Gait velocity: Decreased Gait velocity interpretation: 1.31 - 2.62 ft/sec, indicative of limited community ambulator   General Gait Details: VC's for improved posture, closer walker proximity, and forward gaze. Noted steps appear ataxic and with decreased coordination for swing through to heel strike. Mild knee buckling noted but pt able to correct with UE support on the walker.  Stairs Stairs: Yes Stairs assistance: Min guard, Max assist Stair  Management: Two  rails, Step to pattern, Forwards Number of Stairs: 3 General stair comments: Pt with significant knee buckle when attempting to descend stairs initially. Max assist provided to recover.  Wheelchair Mobility    Modified Rankin (Stroke Patients Only)       Balance Overall balance assessment: Mild deficits observed, not formally tested                                           Pertinent Vitals/Pain Pain Assessment Pain Assessment: Faces Faces Pain Scale: Hurts even more Pain Location: posterior neck/shoulders/biceps Pain Descriptors / Indicators: Discomfort, Operative site guarding, Burning Pain Intervention(s): Limited activity within patient's tolerance, Monitored during session, Repositioned    Home Living Family/patient expects to be discharged to:: Private residence Living Arrangements: Children Available Help at Discharge: Family;Available PRN/intermittently Type of Home: House Home Access: Stairs to enter   Entrance Stairs-Number of Steps: 3 Alternate Level Stairs-Number of Steps: 16 Home Layout: Two level;Able to live on main level with bedroom/bathroom Home Equipment: Shower seat Additional Comments: reports her children are going to the beach next week, will have decreased support at home. Reports she has friends that can check in and help PRN    Prior Function Prior Level of Function : Independent/Modified Independent             Mobility Comments: no AD ADLs Comments: does IADLs     Hand Dominance        Extremity/Trunk Assessment   Upper Extremity Assessment Upper Extremity Assessment: Defer to OT evaluation    Lower Extremity Assessment Lower Extremity Assessment: RLE deficits/detail;LLE deficits/detail RLE Coordination: decreased gross motor LLE Coordination: decreased gross motor    Cervical / Trunk Assessment Cervical / Trunk Assessment: Neck Surgery  Communication   Communication: No difficulties  Cognition  Arousal/Alertness: Awake/alert Behavior During Therapy: WFL for tasks assessed/performed Overall Cognitive Status: Within Functional Limits for tasks assessed                                          General Comments General comments (skin integrity, edema, etc.): VSS on RA    Exercises     Assessment/Plan    PT Assessment Patient needs continued PT services  PT Problem List Decreased strength;Decreased activity tolerance;Decreased balance;Decreased mobility;Decreased knowledge of use of DME;Decreased safety awareness;Decreased knowledge of precautions;Pain       PT Treatment Interventions DME instruction;Gait training;Stair training;Functional mobility training;Therapeutic activities;Therapeutic exercise;Neuromuscular re-education;Patient/family education    PT Goals (Current goals can be found in the Care Plan section)  Acute Rehab PT Goals Patient Stated Goal: Back to independence (PLOF) PT Goal Formulation: With patient Time For Goal Achievement: 03/14/22 Potential to Achieve Goals: Good    Frequency Min 5X/week     Co-evaluation               AM-PAC PT "6 Clicks" Mobility  Outcome Measure Help needed turning from your back to your side while in a flat bed without using bedrails?: None Help needed moving from lying on your back to sitting on the side of a flat bed without using bedrails?: A Little Help needed moving to and from a bed to a chair (including a wheelchair)?: A Little Help needed standing up from a chair using your arms (e.g., wheelchair  or bedside chair)?: A Little Help needed to walk in hospital room?: A Little Help needed climbing 3-5 steps with a railing? : A Lot 6 Click Score: 18    End of Session Equipment Utilized During Treatment: Gait belt Activity Tolerance: Patient tolerated treatment well Patient left: in chair;with call bell/phone within reach Nurse Communication: Mobility status PT Visit Diagnosis: Unsteadiness on  feet (R26.81);Pain;Difficulty in walking, not elsewhere classified (R26.2) Pain - part of body: Shoulder    Time: 4970-2637 PT Time Calculation (min) (ACUTE ONLY): 19 min   Charges:   PT Evaluation $PT Eval Moderate Complexity: 1 Mod          Rolinda Roan, PT, DPT Acute Rehabilitation Services Secure Chat Preferred Office: [phone]   Thelma Comp 03/07/2022, 1:41 PM

## 2022-03-07 NOTE — Progress Notes (Signed)
Progress Note    KEYLEN UZELAC   UXL:[id number]  DOB: 18-Jul-1966  DOA: 03/02/2022     4 PCP: Debbrah Alar, NP  Initial CC: fall at home  Hospital Course: Ms. Covell is a 56 y.o. female with PMH significant for hypertension, depression, anxiety, and C4-5 fusion in [place] more than 20 years ago who presented to the ED after 2 syncopal episodes.  Patient reports she was in her usual state then suddenly she became acutely lightheaded and lost consciousness shortly after standing.  She began to experience severe pain in upper back both shoulders and both arms and has difficulty getting up due to weakness involving arms and legs.  She was able to crawl to her bed but had difficulty sleeping due to the pain.  Work-up in the ED CT head negative for acute intracranial abnormality.  CT C-spine negative for subluxation or fracture.  MRI C spine showed severe stenosis at C3 and C4 and C5 and C6.  She was evaluated by neurosurgery and underwent discectomy at C3-4, C5-6 for decompression of spinal cord and exiting nerve roots on 03/06/2022.  Interval History:  This morning she is still complaining of pain in her upper extremities and is describing "burning" pain and some tingling.  We adjusted her gabapentin and will reevaluate response tomorrow.  Assessment and Plan:  Syncope - resolved  - Likely secondary to cervical spinal stenosis and consistent with central cord syndrome. Some possible contribution from hypovolemia she may have had that day as well. - continue monitoring on tele but workup has been negative    Cervical spinal stenosis of C3-4, and C5-6 with myelopathy and central cord syndrome:  - s/p discectomy at C3-4, C5-6 for decompression of spinal cord and exiting nerve roots on 03/06/2022 -Continue gabapentin, dosage increased today for ongoing neuropathic pains she is describing -Continue muscle relaxers and opioids for pain control - PT/OT - bowel regimen started     Essential hypertension: Continue home Norvasc 10 mg daily   Depression/anxiety: Continue home Effexor 75 mg daily   Tobacco use disorder: Counseled on smoking cessation. NRT with nicorette gum   Alcohol use:  - no further concern for withdrawal; d/c CIWA   Old records reviewed in assessment of this patient  Antimicrobials:   DVT prophylaxis:  SCD's Start: 03/06/22 2155 SCDs Start: 03/03/22 0257   Code Status:   Code Status: Full Code  Disposition Plan:  pending PT/OT evals Status is: Inpt  Objective: Blood pressure 131/84, pulse 63, temperature 98.7 F (37.1 C), temperature source Oral, resp. rate 15, height '5\' 3"'$  (1.6 m), weight 56.2 kg, last menstrual period 03/21/2012, SpO2 98 %.  Examination:  Physical Exam Constitutional:      Appearance: Normal appearance.  HENT:     Head: Normocephalic and atraumatic.     Mouth/Throat:     Mouth: Mucous membranes are moist.  Eyes:     Extraocular Movements: Extraocular movements intact.  Neck:     Comments: Neck brace in place; honeycomb dressing in place over right neck Cardiovascular:     Rate and Rhythm: Normal rate and regular rhythm.  Pulmonary:     Effort: Pulmonary effort is normal.     Breath sounds: Normal breath sounds.  Abdominal:     General: Bowel sounds are normal. There is no distension.     Palpations: Abdomen is soft.     Tenderness: There is no abdominal tenderness.  Musculoskeletal:        General: Normal  range of motion.  Skin:    General: Skin is warm and dry.  Neurological:     General: No focal deficit present.     Mental Status: She is alert.  Psychiatric:        Mood and Affect: Mood normal.        Behavior: Behavior normal.      Consultants:  Neurosurgery  Procedures:  Discectomy at C3-4, C5-6 for decompression of spinal cord and exiting nerve roots, 03/06/2022  Data Reviewed: Results for orders placed or performed during the hospital encounter of 03/02/22 (from the past 24  hour(s))  Type and screen [place]     Status: None   Collection Time: 03/06/22  5:05 PM  Result Value Ref Range   ABO/RH(D) A POS    Antibody Screen NEG    Sample Expiration      03/09/2022,2359 Performed at [facility], [place] [address] [place], [place] [address]   ABO/Rh     Status: None   Collection Time: 03/06/22  5:10 PM  Result Value Ref Range   ABO/RH(D)      A POS Performed at [facility] [address], [place], [place] [address]   Glucose, capillary     Status: Abnormal   Collection Time: 03/07/22  6:17 AM  Result Value Ref Range   Glucose-Capillary 129 (H) 70 - 99 mg/dL    I have Reviewed nursing notes, Vitals, and Lab results since pt's last encounter. Pertinent lab results : see above I have ordered test including BMP, CBC, Mg I have reviewed the last note from staff over past 24 hours I have discussed pt's care plan and test results with nursing staff, case manager   LOS: 4 days   Dwyane Dee, MD [facility] 03/07/2022, 12:54 PM

## 2022-03-07 NOTE — Evaluation (Addendum)
Occupational Therapy Evaluation Patient Details Name: Alexis English MRN: [id number] DOB: [birth year] Today's Date: 03/07/2022   History of Present Illness Pt is a 56 y/o F presenting to ED on 6/15 wtih syncope, head and cervical spine CT negative, brain MRI negative, cervical spine MRI revealing severe stenosis at C3-4 with chronic evidence of myelomalacia and central cord syndrome. S/p C3-4, C5-6 ACDF on 6/19. PMH includes anxiety, depression, kidney stones, HTN, PNA, and cervical fusion.   Clinical Impression   PTA, pt independent with ADLs and functional mobility, lives with daughters who can assist PRN at d/c. Pt currently needing min guard-min A for ADLs, supervision for bed mobility, and min guard for transfers. Began education on cervical precautions, brace wear, and compensatory strategies for ADLs, pt verbalized understanding, needs increased cuing for precautions at times during session. Pt reporting dizziness after short ambulation distance, BP WNL (see below). Pt presenting with impairments listed below, will follow acutely. Recommend AIR at d/c.  Sitting EOB: 138/87, HR 66 Standing: 114/82, HR 69 Sitting post-transfer: 120/79, HR 62      Recommendations for follow up therapy are one component of a multi-disciplinary discharge planning process, led by the attending physician.  Recommendations may be updated based on patient status, additional functional criteria and insurance authorization.   Follow Up Recommendations  Acute inpatient rehab (3hours/day)    Assistance Recommended at Discharge Intermittent Supervision/Assistance  Patient can return home with the following A little help with walking and/or transfers;A little help with bathing/dressing/bathroom;Assistance with cooking/housework;Assist for transportation;Help with stairs or ramp for entrance    Functional Status Assessment  Patient has had a recent decline in their functional status and demonstrates the ability  to make significant improvements in function in a reasonable and predictable amount of time.  Equipment Recommendations  None recommended by OT (defer to next venue of care)    Recommendations for Other Services PT consult;Rehab consult     Precautions / Restrictions Precautions Precautions: Cervical Precaution Booklet Issued: Yes (comment) Precaution Comments: educated pt on 3/3 cervical precautions Required Braces or Orthoses: Cervical Brace Cervical Brace: Hard collar;At all times Restrictions Weight Bearing Restrictions: No      Mobility Bed Mobility Overal bed mobility: Needs Assistance Bed Mobility: Sidelying to Sit, Sit to Sidelying   Sidelying to sit: Supervision     Sit to sidelying: Supervision General bed mobility comments: use of log roll technique    Transfers Overall transfer level: Needs assistance Equipment used: None Transfers: Sit to/from Stand Sit to Stand: Min guard           General transfer comment: reaches for counters/surfaces for stability      Balance Overall balance assessment: Mild deficits observed, not formally tested                                         ADL either performed or assessed with clinical judgement   ADL Overall ADL's : Needs assistance/impaired Eating/Feeding: Modified independent;Sitting   Grooming: Min guard;Standing   Upper Body Bathing: Minimal assistance;Sitting   Lower Body Bathing: Minimal assistance;Sitting/lateral leans;Adhering to back precautions   Upper Body Dressing : Minimal assistance;Sitting   Lower Body Dressing: Minimal assistance;Adhering to back precautions;Sitting/lateral leans   Toilet Transfer: Min guard;Ambulation;Regular Toilet   Toileting- Clothing Manipulation and Hygiene: Supervision/safety       Functional mobility during ADLs: Minimal assistance  Vision Baseline Vision/History: 1 Wears glasses Vision Assessment?: No apparent visual deficits      Perception     Praxis      Pertinent Vitals/Pain Pain Assessment Pain Assessment: Faces Pain Score: 5  Faces Pain Scale: Hurts even more Pain Location: posterior neck/shoulders Pain Descriptors / Indicators: Discomfort Pain Intervention(s): Monitored during session, Limited activity within patient's tolerance, Repositioned     Hand Dominance     Extremity/Trunk Assessment Upper Extremity Assessment Upper Extremity Assessment: Overall WFL for tasks assessed (numbness/pain reported, overall WFL for tasks assessed)   Lower Extremity Assessment Lower Extremity Assessment: Defer to PT evaluation   Cervical / Trunk Assessment Cervical / Trunk Assessment: Neck Surgery   Communication Communication Communication: No difficulties   Cognition Arousal/Alertness: Awake/alert Behavior During Therapy: WFL for tasks assessed/performed Overall Cognitive Status: Within Functional Limits for tasks assessed                                       General Comments  VSS on RA    Exercises     Shoulder Instructions      Home Living Family/patient expects to be discharged to:: Private residence Living Arrangements: Children Available Help at Discharge: Family;Available PRN/intermittently Type of Home: House Home Access: Stairs to enter Entrance Stairs-Number of Steps: 3   Home Layout: Two level;Able to live on main level with bedroom/bathroom Alternate Level Stairs-Number of Steps: 16   Bathroom Shower/Tub: Walk-in shower;Tub/shower unit         Home Equipment: Shower seat   Additional Comments: reports her children are going to the beach next week, will have decreased support at home. Reports she has friends that can check in and help PRN      Prior Functioning/Environment Prior Level of Function : Independent/Modified Independent             Mobility Comments: no AD ADLs Comments: does IADLs        OT Problem List: Decreased strength;Decreased  range of motion;Decreased activity tolerance;Impaired balance (sitting and/or standing);Decreased safety awareness;Decreased knowledge of precautions      OT Treatment/Interventions: Self-care/ADL training;Therapeutic exercise;Therapeutic activities;Patient/family education;Balance training    OT Goals(Current goals can be found in the care plan section) Acute Rehab OT Goals Patient Stated Goal: none stated OT Goal Formulation: With patient Time For Goal Achievement: 03/21/22 Potential to Achieve Goals: Good ADL Goals Pt Will Perform Grooming: with modified independence;standing Pt Will Perform Upper Body Dressing: with modified independence;sitting;standing Pt Will Perform Lower Body Dressing: with modified independence;sitting/lateral leans;sit to/from stand Pt Will Transfer to Toilet: with modified independence;regular height toilet;ambulating Pt Will Perform Tub/Shower Transfer: Tub transfer;Shower transfer;ambulating Additional ADL Goal #1: pt will complete bed mobility mod I in prep for ADLs  OT Frequency: Min 3X/week    Co-evaluation              AM-PAC OT "6 Clicks" Daily Activity     Outcome Measure Help from another person eating meals?: A Little Help from another person taking care of personal grooming?: A Little Help from another person toileting, which includes using toliet, bedpan, or urinal?: A Little Help from another person bathing (including washing, rinsing, drying)?: A Little Help from another person to put on and taking off regular upper body clothing?: A Little Help from another person to put on and taking off regular lower body clothing?: A Little 6 Click Score: 18   End  of Session Equipment Utilized During Treatment: Gait belt Nurse Communication: Mobility status  Activity Tolerance: Patient tolerated treatment well Patient left: in chair;with call bell/phone within reach;with chair alarm set  OT Visit Diagnosis: Unsteadiness on feet (R26.81);Other  abnormalities of gait and mobility (R26.89);Muscle weakness (generalized) (M62.81)                Time: 8264-1583 OT Time Calculation (min): 22 min Charges:  OT General Charges $OT Visit: 1 Visit OT Evaluation $OT Eval Low Complexity: 1 Low  Lynnda Child, OTD, OTR/L Acute Rehab [phone] - [place] 03/07/2022, 1:07 PM

## 2022-03-08 DIAGNOSIS — R55 Syncope and collapse: Secondary | ICD-10-CM | POA: Diagnosis not present

## 2022-03-08 DIAGNOSIS — M4802 Spinal stenosis, cervical region: Secondary | ICD-10-CM | POA: Diagnosis not present

## 2022-03-08 LAB — GLUCOSE, CAPILLARY: Glucose-Capillary: 121 mg/dL — ABNORMAL HIGH (ref 70–99)

## 2022-03-08 NOTE — Progress Notes (Signed)
Inpatient Rehab Admissions Coordinator:   Note therapy recs updated to no PT f/u.  Will sign off for CIR at this time.   Shann Medal, PT, DPT Admissions Coordinator [phone] 03/08/22  11:16 AM

## 2022-03-08 NOTE — Progress Notes (Signed)
Progress Note    Alexis English   ZCH:[id number]  DOB: 08-17-1966  DOA: 03/02/2022     5 PCP: Debbrah Alar, NP  Initial CC: fall at home  Hospital Course: Alexis English is a 56 y.o. female with PMH significant for hypertension, depression, anxiety, and C4-5 fusion in [place] more than 20 years ago who presented to the ED after 2 syncopal episodes.  Patient reports she was in her usual state then suddenly she became acutely lightheaded and lost consciousness shortly after standing.  She began to experience severe pain in upper back both shoulders and both arms and has difficulty getting up due to weakness involving arms and legs.  She was able to crawl to her bed but had difficulty sleeping due to the pain.  Work-up in the ED CT head negative for acute intracranial abnormality.  CT C-spine negative for subluxation or fracture.  MRI C spine showed severe stenosis at C3 and C4 and C5 and C6.  She was evaluated by neurosurgery and underwent discectomy at C3-4, C5-6 for decompression of spinal cord and exiting nerve roots on 03/06/2022.  Interval History:  Gabapentin increase from yesterday seems to have helped. Pain better today and worked better with PT/OT as well. Still feels like she is "bruised" in arms but overall better.  Home DME ordered as she might be able to d/c home tomorrow.  Assessment and Plan:  Syncope - resolved  - Likely secondary to cervical spinal stenosis and consistent with central cord syndrome. Some possible contribution from hypovolemia she may have had that day as well. - continue monitoring on tele but workup has been negative    Cervical spinal stenosis of C3-4, and C5-6 with myelopathy and central cord syndrome:  - s/p discectomy at C3-4, C5-6 for decompression of spinal cord and exiting nerve roots on 03/06/2022 - continue gabapentin at increased dose; working better she feels  -Continue muscle relaxers and opioids for pain control - PT/OT - bowel  regimen started    Essential hypertension: Continue home Norvasc 10 mg daily   Depression/anxiety: Continue home Effexor 75 mg daily   Tobacco use disorder: Counseled on smoking cessation. NRT with nicorette gum   Alcohol use:  - no further concern for withdrawal; d/c CIWA   Old records reviewed in assessment of this patient  Antimicrobials:   DVT prophylaxis:  SCD's Start: 03/06/22 2155 SCDs Start: 03/03/22 0257   Code Status:   Code Status: Full Code  Disposition Plan:  Home possibly Thursday  Status is: Inpt  Objective: Blood pressure 109/77, pulse (!) 57, temperature 98.3 F (36.8 C), temperature source Oral, resp. rate 16, height '5\' 3"'$  (1.6 m), weight 55.2 kg, last menstrual period 03/21/2012, SpO2 95 %.  Examination:  Physical Exam Constitutional:      Appearance: Normal appearance.  HENT:     Head: Normocephalic and atraumatic.     Mouth/Throat:     Mouth: Mucous membranes are moist.  Eyes:     Extraocular Movements: Extraocular movements intact.  Neck:     Comments: Neck brace in place; honeycomb dressing in place over right neck Cardiovascular:     Rate and Rhythm: Normal rate and regular rhythm.  Pulmonary:     Effort: Pulmonary effort is normal.     Breath sounds: Normal breath sounds.  Abdominal:     General: Bowel sounds are normal. There is no distension.     Palpations: Abdomen is soft.     Tenderness: There is no  abdominal tenderness.  Musculoskeletal:        General: Normal range of motion.  Skin:    General: Skin is warm and dry.  Neurological:     General: No focal deficit present.     Mental Status: She is alert.  Psychiatric:        Mood and Affect: Mood normal.        Behavior: Behavior normal.      Consultants:  Neurosurgery  Procedures:  Discectomy at C3-4, C5-6 for decompression of spinal cord and exiting nerve roots, 03/06/2022  Data Reviewed: Results for orders placed or performed during the hospital encounter of  03/02/22 (from the past 24 hour(s))  Glucose, capillary     Status: Abnormal   Collection Time: 03/08/22  5:52 AM  Result Value Ref Range   Glucose-Capillary 121 (H) 70 - 99 mg/dL    I have Reviewed nursing notes, Vitals, and Lab results since pt's last encounter. Pertinent lab results : see above I have ordered test including BMP, CBC, Mg I have reviewed the last note from staff over past 24 hours I have discussed pt's care plan and test results with nursing staff, case manager   LOS: 5 days   Dwyane Dee, MD [facility] 03/08/2022, 4:03 PM

## 2022-03-08 NOTE — Progress Notes (Signed)
Physical Therapy Treatment Patient Details Name: Alexis English MRN: [id number] DOB: 04-Jul-1966 Today's Date: 03/08/2022   History of Present Illness Pt is a 56 y/o F presenting to ED on 6/15 with syncope x2, head and cervical spine CT negative, brain MRI negative, cervical spine MRI revealing severe stenosis at C3-4 with chronic evidence of myelomalacia and central cord syndrome. S/p C3-4, C5-6 ACDF, with removal of C4-C5 plate on 7/67/3419. PMH includes anxiety, depression, kidney stones, HTN, PNA, and cervical fusion.    PT Comments    Pt progressing towards physical therapy goals. Reports feeling significantly improved this session. Pt continues to appear mildly unsteady at times however no overt LOB noted with RW support. Did trial the [facility] and recommend pt only attempts with therapies for now. Pt reports she will have her sister staying with her initially upon return home. Feel pt has progressed to a level where she could return home with outpatient follow up if needed, when appropriate per post-op protocol. Will continue to follow.    Recommendations for follow up therapy are one component of a multi-disciplinary discharge planning process, led by the attending physician.  Recommendations may be updated based on patient status, additional functional criteria and insurance authorization.  Follow Up Recommendations  No PT follow up     Assistance Recommended at Discharge Intermittent Supervision/Assistance  Patient can return home with the following A little help with walking and/or transfers;A little help with bathing/dressing/bathroom;Assistance with cooking/housework;Assist for transportation;Help with stairs or ramp for entrance   Equipment Recommendations  Rolling walker (2 wheels)    Recommendations for Other Services Rehab consult     Precautions / Restrictions Precautions Precautions: Cervical Precaution Booklet Issued: Yes (comment) Precaution Comments: educated pt on  3/3 cervical precautions Required Braces or Orthoses: Cervical Brace Cervical Brace: Hard collar;At all times Restrictions Weight Bearing Restrictions: No     Mobility  Bed Mobility               General bed mobility comments: Pt was received sitting up in recliner    Transfers Overall transfer level: Needs assistance Equipment used: Rolling walker (2 wheels) Transfers: Sit to/from Stand Sit to Stand: Supervision           General transfer comment: Light supervision for safety as pt powered up to full stand. No unsteadiness or LOB noted.    Ambulation/Gait Ambulation/Gait assistance: Min guard Gait Distance (Feet): 200 Feet Assistive device: Rolling walker (2 wheels) Gait Pattern/deviations: Step-through pattern, Decreased stride length, Knees buckling, Ataxic Gait velocity: Decreased Gait velocity interpretation: 1.31 - 2.62 ft/sec, indicative of limited community ambulator   General Gait Details: VC's for improved posture, closer walker proximity, and forward gaze. Pt demonstrates a smoother gait pattern and more coordinated heel strike.   Stairs Stairs: Yes Stairs assistance: Min guard Stair Management: One rail Right, No rails, Step to pattern, Forwards Number of Stairs: 5 (x2) General stair comments: Initially with 1 railing, and then with no rails to simulate home environment. Without rails, pt appears unsteady. Recommend at least 1 HHA when attempting stairs at home.   Wheelchair Mobility    Modified Rankin (Stroke Patients Only)       Balance Overall balance assessment: Mild deficits observed, not formally tested                                          Cognition Arousal/Alertness:  Awake/alert Behavior During Therapy: WFL for tasks assessed/performed Overall Cognitive Status: Within Functional Limits for tasks assessed                                          Exercises      General Comments         Pertinent Vitals/Pain Pain Assessment Pain Assessment: Faces Faces Pain Scale: Hurts even more Pain Location: posterior neck/shoulders/biceps Pain Descriptors / Indicators: Discomfort, Operative site guarding, Burning Pain Intervention(s): Limited activity within patient's tolerance, Monitored during session, Repositioned    Home Living Family/patient expects to be discharged to:: Private residence Living Arrangements: Children Available Help at Discharge: Family;Available PRN/intermittently Type of Home: House Home Access: Stairs to enter   Entrance Stairs-Number of Steps: 3 Alternate Level Stairs-Number of Steps: 16 Home Layout: Two level;Able to live on main level with bedroom/bathroom Home Equipment: Shower seat Additional Comments: reports her children are going to the beach next week, will have decreased support at home. Reports she has friends that can check in and help PRN    Prior Function            PT Goals (current goals can now be found in the care plan section) Acute Rehab PT Goals Patient Stated Goal: Back to independence (PLOF) PT Goal Formulation: With patient Time For Goal Achievement: 03/14/22 Potential to Achieve Goals: Good Progress towards PT goals: Progressing toward goals    Frequency    Min 5X/week      PT Plan Discharge plan needs to be updated    Co-evaluation              AM-PAC PT "6 Clicks" Mobility   Outcome Measure  Help needed turning from your back to your side while in a flat bed without using bedrails?: None Help needed moving from lying on your back to sitting on the side of a flat bed without using bedrails?: A Little Help needed moving to and from a bed to a chair (including a wheelchair)?: A Little Help needed standing up from a chair using your arms (e.g., wheelchair or bedside chair)?: A Little Help needed to walk in hospital room?: A Little Help needed climbing 3-5 steps with a railing? : A Little 6 Click Score:  19    End of Session Equipment Utilized During Treatment: Gait belt Activity Tolerance: Patient tolerated treatment well Patient left: in chair;with call bell/phone within reach Nurse Communication: Mobility status PT Visit Diagnosis: Unsteadiness on feet (R26.81);Pain;Difficulty in walking, not elsewhere classified (R26.2) Pain - part of body: Shoulder     Time: 4431-5400 PT Time Calculation (min) (ACUTE ONLY): 19 min  Charges:  $Gait Training: 8-22 mins                     Rolinda Roan, PT, DPT Acute Rehabilitation Services Secure Chat Preferred Office: [phone]    Thelma Comp 03/08/2022, 9:12 AM

## 2022-03-08 NOTE — Progress Notes (Signed)
   Providing Compassionate, Quality Care - Together  NEUROSURGERY PROGRESS NOTE   S: No issues overnight.   O: EXAM:  BP 124/80 (BP Location: Left Arm)   Pulse 61   Temp 98.9 F (37.2 C) (Oral)   Resp 16   Ht '5\' 3"'$  (1.6 m)   Wt 55.2 kg   LMP 03/21/2012   SpO2 97%   BMI 21.56 kg/m   Awake, alert, oriented x3 PERRL Speech fluent, appropriate  CNs grossly intact  4+/5 BUE/BLE  Neck soft Trachea midline  ASSESSMENT:  56 y.o. female with   C3-4, C5-6 severe stenosis with myelopathy and central cord syndrome   PLAN: -Wean Decadron, will need a 2 week taper -Drain discontinued -Inpatient rehab recommended, pending placement -Continue pain control      Thank you for allowing me to participate in this patient's care.  Please do not hesitate to call with questions or concerns.   Elwin Sleight, [facility] Cell: [phone]

## 2022-03-08 NOTE — Progress Notes (Signed)
Occupational Therapy Treatment Patient Details Name: Alexis English MRN: [id number] DOB: 04-03-1966 Today's Date: 03/08/2022   History of present illness Pt is a 56 y/o F presenting to ED on 6/15 with syncope x2, head and cervical spine CT negative, brain MRI negative, cervical spine MRI revealing severe stenosis at C3-4 with chronic evidence of myelomalacia and central cord syndrome. S/p C3-4, C5-6 ACDF, with removal of C4-C5 plate on 1/49/7026. PMH includes anxiety, depression, kidney stones, HTN, PNA, and cervical fusion.   OT comments  Pt progressing towards goals, able to ambulate hallway distance with [facility] with supervision. Completes LB dressing and tub transfers with supervision and min cues. Educated pt on use of BSC as shower chair at at d/c. Reviewed cervical precautions, brace wear, and compensatory strategies for ADLs, pt verbalized understanding. Pt presenting with impairments listed below, will follow acutely. Pt progressing well updating d/c recommendation to home with assistance, pt will need BSC at d/c.   Recommendations for follow up therapy are one component of a multi-disciplinary discharge planning process, led by the attending physician.  Recommendations may be updated based on patient status, additional functional criteria and insurance authorization.    Follow Up Recommendations  No OT follow up    Assistance Recommended at Discharge Intermittent Supervision/Assistance  Patient can return home with the following  A little help with walking and/or transfers;A little help with bathing/dressing/bathroom;Assistance with cooking/housework;Assist for transportation;Help with stairs or ramp for entrance   Equipment Recommendations  BSC/3in1    Recommendations for Other Services PT consult    Precautions / Restrictions Precautions Precautions: Cervical Precaution Booklet Issued: Yes (comment) Precaution Comments: educated pt on 3/3 cervical precautions Required Braces  or Orthoses: Cervical Brace Cervical Brace: Hard collar;At all times Restrictions Weight Bearing Restrictions: No       Mobility Bed Mobility               General bed mobility comments: up in chair upon arrival    Transfers Overall transfer level: Needs assistance Equipment used: Rolling walker (2 wheels) Transfers: Sit to/from Stand Sit to Stand: Supervision                 Balance Overall balance assessment: Mild deficits observed, not formally tested                                         ADL either performed or assessed with clinical judgement   ADL Overall ADL's : Needs assistance/impaired                     Lower Body Dressing: Supervision/safety;Sitting/lateral leans Lower Body Dressing Details (indicate cue type and reason): figure 4 technique         Tub/ Shower Transfer: Tub transfer;Supervision/safety;Cueing for safety;Cueing for sequencing;Ambulation   Functional mobility during ADLs: Supervision/safety;Cane      Extremity/Trunk Assessment Upper Extremity Assessment Upper Extremity Assessment: Overall WFL for tasks assessed   Lower Extremity Assessment Lower Extremity Assessment: Defer to PT evaluation        Vision   Vision Assessment?: No apparent visual deficits   Perception Perception Perception: Not tested   Praxis Praxis Praxis: Not tested    Cognition Arousal/Alertness: Awake/alert Behavior During Therapy: WFL for tasks assessed/performed Overall Cognitive Status: Within Functional Limits for tasks assessed  Exercises      Shoulder Instructions       General Comments VSS on RA    Pertinent Vitals/ Pain       Pain Assessment Pain Assessment: Faces Pain Score: 5  Faces Pain Scale: Hurts even more Pain Location: posterior neck/shoulders/biceps Pain Descriptors / Indicators: Discomfort, Operative site guarding, Burning Pain  Intervention(s): Limited activity within patient's tolerance, Monitored during session, Repositioned, Premedicated before session  Home Living                                          Prior Functioning/Environment              Frequency  Min 3X/week        Progress Toward Goals  OT Goals(current goals can now be found in the care plan section)  Progress towards OT goals: Progressing toward goals  Acute Rehab OT Goals Patient Stated Goal: to go home OT Goal Formulation: With patient Time For Goal Achievement: 03/21/22 Potential to Achieve Goals: Good ADL Goals Pt Will Perform Grooming: with modified independence;standing Pt Will Perform Upper Body Dressing: with modified independence;sitting;standing Pt Will Perform Lower Body Dressing: with modified independence;sitting/lateral leans;sit to/from stand Pt Will Transfer to Toilet: with modified independence;regular height toilet;ambulating Pt Will Perform Tub/Shower Transfer: Tub transfer;Shower transfer;ambulating Additional ADL Goal #1: pt will complete bed mobility mod I in prep for ADLs  Plan Discharge plan needs to be updated;Frequency remains appropriate    Co-evaluation                 AM-PAC OT "6 Clicks" Daily Activity     Outcome Measure   Help from another person eating meals?: None Help from another person taking care of personal grooming?: None Help from another person toileting, which includes using toliet, bedpan, or urinal?: None Help from another person bathing (including washing, rinsing, drying)?: A Little Help from another person to put on and taking off regular upper body clothing?: A Little Help from another person to put on and taking off regular lower body clothing?: A Little 6 Click Score: 21    End of Session Equipment Utilized During Treatment: Gait belt;Other (comment) (cane)  OT Visit Diagnosis: Unsteadiness on feet (R26.81);Other abnormalities of gait and  mobility (R26.89);Muscle weakness (generalized) (M62.81)   Activity Tolerance Patient tolerated treatment well   Patient Left in chair;with call bell/phone within reach   Nurse Communication Mobility status        Time: 2458-0998 OT Time Calculation (min): 22 min  Charges: OT General Charges $OT Visit: 1 Visit OT Treatments $Self Care/Home Management : 8-22 mins  Lynnda Child, OTD, OTR/L Acute Rehab [phone] - [place] 03/08/2022, 2:08 PM

## 2022-03-09 DIAGNOSIS — R55 Syncope and collapse: Secondary | ICD-10-CM | POA: Diagnosis not present

## 2022-03-09 DIAGNOSIS — M4802 Spinal stenosis, cervical region: Secondary | ICD-10-CM | POA: Diagnosis not present

## 2022-03-09 MED ORDER — ACETAMINOPHEN 325 MG PO TABS: 650.0000 mg | ORAL_TABLET | ORAL | Status: AC | PRN

## 2022-03-09 MED ORDER — SENNOSIDES-DOCUSATE SODIUM 8.6-50 MG PO TABS: 1.0000 | ORAL_TABLET | Freq: Two times a day (BID) | ORAL | Status: DC | PRN

## 2022-03-09 MED ORDER — CYCLOBENZAPRINE HCL 5 MG PO TABS: 5.0000 mg | ORAL_TABLET | Freq: Three times a day (TID) | ORAL | 0 refills | Status: DC | PRN

## 2022-03-09 MED ORDER — ADULT MULTIVITAMIN W/MINERALS CH: 1.0000 | ORAL_TABLET | Freq: Every day | ORAL | Status: DC

## 2022-03-09 MED ORDER — GABAPENTIN 300 MG PO CAPS: 600.0000 mg | ORAL_CAPSULE | Freq: Three times a day (TID) | ORAL | 2 refills | Status: DC

## 2022-03-09 MED ORDER — OXYCODONE HCL 5 MG PO TABS: 5.0000 mg | ORAL_TABLET | ORAL | 0 refills | Status: DC | PRN

## 2022-03-09 MED ORDER — DEXAMETHASONE 4 MG PO TABS: ORAL_TABLET | ORAL | 0 refills | Status: DC

## 2022-03-09 NOTE — Plan of Care (Signed)
  Problem: Clinical Measurements: Goal: Cardiovascular complication will be avoided Outcome: Completed/Met   Problem: Coping: Goal: Level of anxiety will decrease Outcome: Completed/Met   Problem: Elimination: Goal: Will not experience complications related to bowel motility Outcome: Completed/Met Goal: Will not experience complications related to urinary retention Outcome: Completed/Met   Problem: Pain Managment: Goal: General experience of comfort will improve Outcome: Completed/Met   Problem: Safety: Goal: Ability to remain free from injury will improve Outcome: Completed/Met   Problem: Skin Integrity: Goal: Risk for impaired skin integrity will decrease Outcome: Completed/Met   Problem: Education: Goal: Knowledge of condition and prescribed therapy will improve Outcome: Completed/Met   Problem: Cardiac: Goal: Will achieve and/or maintain adequate cardiac output Outcome: Completed/Met   Problem: Physical Regulation: Goal: Complications related to the disease process, condition or treatment will be avoided or minimized Outcome: Completed/Met

## 2022-03-09 NOTE — Progress Notes (Addendum)
Occupational Therapy Treatment Patient Details Name: Alexis English MRN: [id number] DOB: 1966/04/23 Today's Date: 03/09/2022   History of present illness Pt is a 56 y/o F presenting to ED on 6/15 with syncope x2, head and cervical spine CT negative, brain MRI negative, cervical spine MRI revealing severe stenosis at C3-4 with chronic evidence of myelomalacia and central cord syndrome. S/p C3-4, C5-6 ACDF, with removal of C4-C5 plate on 3/55/7322. PMH includes anxiety, depression, kidney stones, HTN, PNA, and cervical fusion.   OT comments  Pt progressing towards goals, pt mod I with ADLs and mobility with SPC this session. Educated pt on donning/doffing of hard collar, how to change pads/adjust fit, and wear schedule. Reviewed cervical precautions and compensatory strategies for ADLs/mobility, pt verbalized understanding and demo's during session. Pt reporting mild numbness in fingertips, however has not affected fine motor tasks/coordination. Pt presenting with impairments listed below, will follow acutely. Continue to recommend d/c home with assistance.   Recommendations for follow up therapy are one component of a multi-disciplinary discharge planning process, led by the attending physician.  Recommendations may be updated based on patient status, additional functional criteria and insurance authorization.    Follow Up Recommendations  No OT follow up    Assistance Recommended at Discharge Intermittent Supervision/Assistance  Patient can return home with the following  A little help with walking and/or transfers;A little help with bathing/dressing/bathroom;Assistance with cooking/housework;Assist for transportation;Help with stairs or ramp for entrance   Equipment Recommendations  BSC/3in1    Recommendations for Other Services PT consult    Precautions / Restrictions Precautions Precautions: Cervical Precaution Booklet Issued: Yes (comment) Precaution Comments: educated pt on 3/3  cervical precautions Required Braces or Orthoses: Cervical Brace Cervical Brace: Hard collar;At all times Restrictions Weight Bearing Restrictions: No       Mobility Bed Mobility               General bed mobility comments: up in chair upon arrival    Transfers Overall transfer level: Modified independent Equipment used: Straight cane               General transfer comment: mod I for all transfers with use of SPC     Balance Overall balance assessment: Mild deficits observed, not formally tested                                         ADL either performed or assessed with clinical judgement   ADL Overall ADL's : Needs assistance/impaired     Grooming: Modified independent;Standing Grooming Details (indicate cue type and reason): completed standing at sink         Upper Body Dressing : Min guard;Standing Upper Body Dressing Details (indicate cue type and reason): to don gown as jacket     Toilet Transfer: Modified Independent;Ambulation (with cane)           Functional mobility during ADLs: Modified independent;Cane      Extremity/Trunk Assessment Upper Extremity Assessment Upper Extremity Assessment: Overall WFL for tasks assessed   Lower Extremity Assessment Lower Extremity Assessment: Defer to PT evaluation        Vision   Vision Assessment?: No apparent visual deficits   Perception Perception Perception: Not tested   Praxis Praxis Praxis: Not tested    Cognition Arousal/Alertness: Awake/alert Behavior During Therapy: WFL for tasks assessed/performed Overall Cognitive Status: Within Functional Limits for tasks assessed  Exercises      Shoulder Instructions       General Comments VSS on RA    Pertinent Vitals/ Pain       Pain Assessment Pain Assessment: Faces Pain Score: 2  Faces Pain Scale: Hurts a little bit Pain Location: posterior  neck/shoulders/biceps Pain Descriptors / Indicators: Discomfort, Operative site guarding, Burning Pain Intervention(s): Limited activity within patient's tolerance, Monitored during session, Repositioned  Home Living                                          Prior Functioning/Environment              Frequency  Min 3X/week        Progress Toward Goals  OT Goals(current goals can now be found in the care plan section)  Progress towards OT goals: Progressing toward goals  Acute Rehab OT Goals Patient Stated Goal: to go home OT Goal Formulation: With patient Time For Goal Achievement: 03/21/22 Potential to Achieve Goals: Good ADL Goals Pt Will Perform Grooming: with modified independence;standing Pt Will Perform Upper Body Dressing: with modified independence;sitting;standing Pt Will Perform Lower Body Dressing: with modified independence;sitting/lateral leans;sit to/from stand Pt Will Transfer to Toilet: with modified independence;regular height toilet;ambulating Pt Will Perform Tub/Shower Transfer: Tub transfer;Shower transfer;ambulating Additional ADL Goal #1: pt will complete bed mobility mod I in prep for ADLs  Plan Discharge plan needs to be updated;Frequency remains appropriate    Co-evaluation                 AM-PAC OT "6 Clicks" Daily Activity     Outcome Measure   Help from another person eating meals?: None Help from another person taking care of personal grooming?: None Help from another person toileting, which includes using toliet, bedpan, or urinal?: None Help from another person bathing (including washing, rinsing, drying)?: A Little Help from another person to put on and taking off regular upper body clothing?: None Help from another person to put on and taking off regular lower body clothing?: A Little 6 Click Score: 22    End of Session    OT Visit Diagnosis: Unsteadiness on feet (R26.81);Other abnormalities of gait and  mobility (R26.89);Muscle weakness (generalized) (M62.81)   Activity Tolerance Patient tolerated treatment well   Patient Left in chair;with call bell/phone within reach   Nurse Communication Mobility status        Time: 4888-9169 OT Time Calculation (min): 13 min  Charges: OT General Charges $OT Visit: 1 Visit OT Treatments $Self Care/Home Management : 8-22 mins  Lynnda Child, OTD, OTR/L Acute Rehab [phone] - [place] 03/09/2022, 10:02 AM

## 2022-03-09 NOTE — Discharge Summary (Signed)
Physician Discharge Summary   Alexis English GNF:[id number] DOB: 1965/11/18 DOA: 03/02/2022  PCP: Debbrah Alar, NP  Admit date: 03/02/2022 Discharge date: 03/09/2022   Admitted From: Home Disposition:  Home Discharging physician: Dwyane Dee, MD  Recommendations for Outpatient Follow-up:  Follow up with neurosurgery  Home Health:  Equipment/Devices: RW, BSC, 3n1  Discharge Condition: stable CODE STATUS: Full Diet recommendation:  Diet Orders (From admission, onward)     Start     Ordered   03/09/22 0000  Diet general        03/09/22 1242   03/07/22 0636  Diet regular Room service appropriate? Yes; Fluid consistency: Thin  Diet effective now       Question Answer Comment  Room service appropriate? Yes   Fluid consistency: Thin      03/07/22 0635            Hospital Course: Alexis English is a 56 y.o. female with PMH significant for hypertension, depression, anxiety, and C4-5 fusion in [place] more than 20 years ago who presented to the ED after 2 syncopal episodes.  Patient reports she was in her usual state then suddenly she became acutely lightheaded and lost consciousness shortly after standing.  She began to experience severe pain in upper back both shoulders and both arms and has difficulty getting up due to weakness involving arms and legs.  She was able to crawl to her bed but had difficulty sleeping due to the pain.  Work-up in the ED CT head negative for acute intracranial abnormality.  CT C-spine negative for subluxation or fracture.  MRI C spine showed severe stenosis at C3 and C4 and C5 and C6.  She was evaluated by neurosurgery and underwent discectomy at C3-4, C5-6 for decompression of spinal cord and exiting nerve roots on 03/06/2022.  Assessment and Plan: No notes have been filed under this hospital service. Service: Hospitalist      The patient's chronic medical conditions were treated accordingly per the patient's home medication  regimen except as noted.  On day of discharge, patient was felt deemed stable for discharge. Patient/family member advised to call PCP or come back to ER if needed.   Principal Diagnosis: Syncope  Discharge Diagnoses: Principal Problem:   Syncope Active Problems:   Depression with anxiety   HTN (hypertension)   Cervical spinal stenosis   Discharge Instructions     Diet general   Complete by: As directed    Discharge wound care:   Complete by: As directed    Per neurosurgery instructions   Incentive spirometry RT   Complete by: As directed    Increase activity slowly   Complete by: As directed       Allergies as of 03/09/2022       Reactions   Hydrocodone Itching   Bupropion Hcl    REACTION: hives   Buspar [buspirone] Other (See Comments)   Nausea, dizziness, weakness, muscle cramps   Chantix [varenicline]    dizziness   Propoxyphene Hives   Propoxyphene N-acetaminophen    REACTION: hives   Zoloft [sertraline]    hives        Medication List     STOP taking these medications    hydrochlorothiazide 25 MG tablet Commonly known as: HYDRODIURIL   potassium chloride SA 20 MEQ tablet Commonly known as: Klor-Con M20       TAKE these medications    acetaminophen 325 MG tablet Commonly known as: TYLENOL Take 2 tablets (650 mg total)  by mouth every 4 (four) hours as needed for mild pain or moderate pain (or Fever >/= 101).   amLODipine 10 MG tablet Commonly known as: NORVASC TAKE 1 TABLET BY MOUTH EVERY DAY   cyclobenzaprine 5 MG tablet Commonly known as: FLEXERIL Take 1 tablet (5 mg total) by mouth 3 (three) times daily as needed for muscle spasms.   dexamethasone 4 MG tablet Commonly known as: DECADRON Take 1 tablet three times a day for 2 days, then 1 tablet twice daily for 3 days, then 1 tablet daily for 3 days, then 1/2 (half) tablet daily for 4 days   gabapentin 300 MG capsule Commonly known as: NEURONTIN Take 2 capsules (600 mg total) by  mouth 3 (three) times daily. What changed:  medication strength how much to take how to take this when to take this additional instructions   multivitamin with minerals Tabs tablet Take 1 tablet by mouth daily. Start taking on: March 10, 2022   oxyCODONE 5 MG immediate release tablet Commonly known as: Oxy IR/ROXICODONE Take 1 tablet (5 mg total) by mouth every 4 (four) hours as needed for severe pain ((score 7 to 10)).   senna-docusate 8.6-50 MG tablet Commonly known as: Senokot-S Take 1 tablet by mouth 2 (two) times daily as needed for mild constipation.   venlafaxine XR 75 MG 24 hr capsule Commonly known as: EFFEXOR-XR TAKE 1 CAPSULE BY MOUTH DAILY WITH BREAKFAST.               Durable Medical Equipment  (From admission, onward)           Start     Ordered   03/09/22 1044  For home use only DME Cane  Once        03/09/22 1043   03/08/22 1604  For home use only DME Walker rolling  Once       Question Answer Comment  Walker: With [place] Wheels   Patient needs a walker to treat with the following condition Cervical radiculopathy      03/08/22 1603   03/08/22 1604  For home use only DME Bedside commode  Once       Question Answer Comment  Patient needs a bedside commode to treat with the following condition Cervical spinal stenosis   Patient needs a bedside commode to treat with the following condition Cervical radiculopathy      03/08/22 1603   03/08/22 1604  For home use only DME 3 n 1  Once        03/08/22 1603              Discharge Care Instructions  (From admission, onward)           Start     Ordered   03/09/22 0000  Discharge wound care:       Comments: Per neurosurgery instructions   03/09/22 1242            Follow-up Information     Dawley, Troy C, DO Follow up in 1 week(s).   Contact information: [address] [place] [address] [phone]                Allergies  Allergen Reactions    Hydrocodone Itching   Bupropion Hcl     REACTION: hives   Buspar [Buspirone] Other (See Comments)    Nausea, dizziness, weakness, muscle cramps   Chantix [Varenicline]     dizziness   Propoxyphene Hives  Propoxyphene N-Acetaminophen     REACTION: hives   Zoloft [Sertraline]     hives    Consultations: Neurosurgery  Procedures: Discectomy at C3-4, C5-6 for decompression of spinal cord and exiting nerve roots, 03/06/2022  Discharge Exam: BP 113/77 (BP Location: Left Arm)   Pulse 63   Temp 98.4 F (36.9 C) (Oral)   Resp 16   Ht '5\' 3"'$  (1.6 m)   Wt 55.2 kg   LMP 03/21/2012   SpO2 98%   BMI 21.56 kg/m  Physical Exam Constitutional:      Appearance: Normal appearance.  HENT:     Head: Normocephalic and atraumatic.     Mouth/Throat:     Mouth: Mucous membranes are moist.  Eyes:     Extraocular Movements: Extraocular movements intact.  Neck:     Comments: Neck brace in place; honeycomb dressing in place over right neck Cardiovascular:     Rate and Rhythm: Normal rate and regular rhythm.  Pulmonary:     Effort: Pulmonary effort is normal.     Breath sounds: Normal breath sounds.  Abdominal:     General: Bowel sounds are normal. There is no distension.     Palpations: Abdomen is soft.     Tenderness: There is no abdominal tenderness.  Musculoskeletal:        General: Normal range of motion.  Skin:    General: Skin is warm and dry.  Neurological:     General: No focal deficit present.     Mental Status: She is alert.  Psychiatric:        Mood and Affect: Mood normal.        Behavior: Behavior normal.      The results of significant diagnostics from this hospitalization (including imaging, microbiology, ancillary and laboratory) are listed below for reference.   Microbiology: Recent Results (from the past 240 hour(s))  Surgical PCR screen     Status: None   Collection Time: 03/03/22  7:00 PM   Specimen: Nasal Mucosa; Nasal Swab  Result Value Ref Range Status    MRSA, PCR NEGATIVE NEGATIVE Final   Staphylococcus aureus NEGATIVE NEGATIVE Final    Comment: (NOTE) The Xpert SA Assay (FDA approved for NASAL specimens in patients 25 years of age and older), is one component of a comprehensive surveillance program. It is not intended to diagnose infection nor to guide or monitor treatment. Performed at [facility], [place] [address], [place], [place] [address]      Labs: BNP (last 3 results) No results for input(s): "BNP" in the last 8760 hours. Basic Metabolic Panel: Recent Labs  Lab 03/03/22 0414 03/06/22 0400  NA 141 141  K 3.5 4.6  CL 108 105  CO2 26 25  GLUCOSE 114* 125*  BUN 8 17  CREATININE 0.62 0.77  CALCIUM 8.7* 8.9   Liver Function Tests: No results for input(s): "AST", "ALT", "ALKPHOS", "BILITOT", "PROT", "ALBUMIN" in the last 168 hours. No results for input(s): "LIPASE", "AMYLASE" in the last 168 hours. No results for input(s): "AMMONIA" in the last 168 hours. CBC: Recent Labs  Lab 03/03/22 0414 03/06/22 0400  WBC 7.4 13.0*  HGB 14.3 14.5  HCT 41.2 41.5  MCV 96.7 98.8  PLT 330 331   Cardiac Enzymes: No results for input(s): "CKTOTAL", "CKMB", "CKMBINDEX", "TROPONINI" in the last 168 hours. BNP: Invalid input(s): "POCBNP" CBG: Recent Labs  Lab 03/05/22 0508 03/05/22 2159 03/06/22 0619 03/07/22 0617 03/08/22 0552  GLUCAP 139* 152* 134* 129* 121*  D-Dimer No results for input(s): "DDIMER" in the last 72 hours. Hgb A1c No results for input(s): "HGBA1C" in the last 72 hours. Lipid Profile No results for input(s): "CHOL", "HDL", "LDLCALC", "TRIG", "CHOLHDL", "LDLDIRECT" in the last 72 hours. Thyroid function studies No results for input(s): "TSH", "T4TOTAL", "T3FREE", "THYROIDAB" in the last 72 hours.  Invalid input(s): "FREET3" Anemia work up No results for input(s): "VITAMINB12", "FOLATE", "FERRITIN", "TIBC", "IRON", "RETICCTPCT" in the last 72 hours. Urinalysis    Component Value Date/Time    COLORURINE STRAW (A) 03/02/2022 1331   APPEARANCEUR CLEAR 03/02/2022 1331   LABSPEC 1.005 03/02/2022 1331   PHURINE 6.0 03/02/2022 1331   GLUCOSEU NEGATIVE 03/02/2022 1331   GLUCOSEU NEGATIVE 03/13/2016 1048   HGBUR SMALL (A) 03/02/2022 1331   BILIRUBINUR NEGATIVE 03/02/2022 1331   BILIRUBINUR neg 02/22/2016 1450   KETONESUR 20 (A) 03/02/2022 1331   PROTEINUR NEGATIVE 03/02/2022 1331   UROBILINOGEN 0.2 03/13/2016 1048   NITRITE NEGATIVE 03/02/2022 1331   LEUKOCYTESUR NEGATIVE 03/02/2022 1331   Sepsis Labs Recent Labs  Lab 03/03/22 0414 03/06/22 0400  WBC 7.4 13.0*   Microbiology Recent Results (from the past 240 hour(s))  Surgical PCR screen     Status: None   Collection Time: 03/03/22  7:00 PM   Specimen: Nasal Mucosa; Nasal Swab  Result Value Ref Range Status   MRSA, PCR NEGATIVE NEGATIVE Final   Staphylococcus aureus NEGATIVE NEGATIVE Final    Comment: (NOTE) The Xpert SA Assay (FDA approved for NASAL specimens in patients 83 years of age and older), is one component of a comprehensive surveillance program. It is not intended to diagnose infection nor to guide or monitor treatment. Performed at [facility], [place] [address] [place], [place] [address]     Procedures/Studies: DG Cervical Spine 1 View  Result Date: 03/06/2022 CLINICAL DATA:  Cervical decompression discectomy EXAM: DG CERVICAL SPINE - 1 VIEW COMPARISON:  MRI 03/02/2022 FINDINGS: Two low resolution intraoperative spot views of the cervical spine. Total fluoroscopy time was 23 seconds, fluoroscopic dose of 3.77 mGy. The images demonstrate anterior plate and fixating screws from C3 through C6 with interbody devices at C3-C4 and C5-C6. IMPRESSION: Intraoperative fluoroscopic assistance provided during cervical spine surgery Electronically Signed   By: Donavan Foil M.D.   On: 03/06/2022 20:42   DG C-Arm 1-60 Min-No Report  Result Date: 03/06/2022 Fluoroscopy was utilized by the requesting physician.   No radiographic interpretation.   DG C-Arm 1-60 Min-No Report  Result Date: 03/06/2022 Fluoroscopy was utilized by the requesting physician.  No radiographic interpretation.   DG C-Arm 1-60 Min-No Report  Result Date: 03/06/2022 Fluoroscopy was utilized by the requesting physician.  No radiographic interpretation.   ECHOCARDIOGRAM COMPLETE  Result Date: 03/03/2022    ECHOCARDIOGRAM REPORT   Patient Name:   CHARIS JULIANA Date of Exam: 03/03/2022 Medical Rec #:  [id number]         Height:       63.0 in Accession #:    [id number]        Weight:       120.0 lb Date of Birth:  [birth year]         BSA:          1.556 m Patient Age:    71 years          BP:           123/81 mmHg Patient Gender: F  HR:           74 bpm. Exam Location:  Inpatient Procedure: 2D Echo, Cardiac Doppler and Color Doppler Indications:    Syncope  History:        Patient has no prior history of Echocardiogram examinations.  Sonographer:    Merrie Roof RDCS Referring Phys: [id number] H[place]  1. Left ventricular ejection fraction, by estimation, is 60 to 65%. The left ventricle has normal function. The left ventricle has no regional wall motion abnormalities. Left ventricular diastolic parameters were normal.  2. Right ventricular systolic function is normal. The right ventricular size is normal.  3. The mitral valve is abnormal. Trivial mitral valve regurgitation. No evidence of mitral stenosis.  4. The aortic valve is tricuspid. Aortic valve regurgitation is not visualized. No aortic stenosis is present.  5. The inferior vena cava is normal in size with greater than 50% respiratory variability, suggesting right atrial pressure of 3 mmHg. FINDINGS  Left Ventricle: Left ventricular ejection fraction, by estimation, is 60 to 65%. The left ventricle has normal function. The left ventricle has no regional wall motion abnormalities. The left ventricular internal cavity size was normal in size. There is  no  left ventricular hypertrophy. Left ventricular diastolic parameters were normal. Right Ventricle: The right ventricular size is normal. No increase in right ventricular wall thickness. Right ventricular systolic function is normal. Left Atrium: Left atrial size was normal in size. Right Atrium: Right atrial size was normal in size. Pericardium: There is no evidence of pericardial effusion. Mitral Valve: The mitral valve is abnormal. There is mild thickening of the mitral valve leaflet(s). There is mild calcification of the mitral valve leaflet(s). Trivial mitral valve regurgitation. No evidence of mitral valve stenosis. Tricuspid Valve: The tricuspid valve is normal in structure. Tricuspid valve regurgitation is trivial. No evidence of tricuspid stenosis. Aortic Valve: The aortic valve is tricuspid. Aortic valve regurgitation is not visualized. No aortic stenosis is present. Pulmonic Valve: The pulmonic valve was normal in structure. Pulmonic valve regurgitation is trivial. No evidence of pulmonic stenosis. Aorta: The aortic root is normal in size and structure. Venous: The inferior vena cava is normal in size with greater than 50% respiratory variability, suggesting right atrial pressure of 3 mmHg. IAS/Shunts: The interatrial septum was not well visualized.  LEFT VENTRICLE PLAX 2D LVIDd:         4.10 cm   Diastology LVIDs:         2.70 cm   LV e' medial:    10.40 cm/s LV PW:         0.80 cm   LV E/e' medial:  6.2 LV IVS:        0.70 cm   LV e' lateral:   11.60 cm/s LVOT diam:     1.90 cm   LV E/e' lateral: 5.6 LV SV:         45 LV SV Index:   29 LVOT Area:     2.84 cm  RIGHT VENTRICLE RV Basal diam:  3.80 cm TAPSE (M-mode): 2.7 cm LEFT ATRIUM             Index        RIGHT ATRIUM           Index LA diam:        2.50 cm 1.61 cm/m   RA Area:     13.00 cm LA Vol (A2C):   54.0 ml 34.70 ml/m  RA Volume:  32.40 ml  20.82 ml/m LA Vol (A4C):   70.2 ml 45.11 ml/m LA Biplane Vol: 64.0 ml 41.12 ml/m  AORTIC VALVE LVOT  Vmax:   82.80 cm/s LVOT Vmean:  54.500 cm/s LVOT VTI:    0.160 m  AORTA Ao Root diam: 3.30 cm Ao Asc diam:  3.50 cm MITRAL VALVE MV Area (PHT): 3.72 cm    SHUNTS MV Decel Time: 204 msec    Systemic VTI:  0.16 m MV E velocity: 65.00 cm/s  Systemic Diam: 1.90 cm MV A velocity: 75.50 cm/s MV E/A ratio:  0.86 Jenkins Rouge MD Electronically signed by Jenkins Rouge MD Signature Date/Time: 03/03/2022/12:21:20 PM    Final    MR Cervical Spine Wo Contrast  Result Date: 03/03/2022 CLINICAL DATA:  Initial evaluation for acute neck pain. EXAM: MRI CERVICAL SPINE WITHOUT CONTRAST TECHNIQUE: Multiplanar, multisequence MR imaging of the cervical spine was performed. No intravenous contrast was administered. COMPARISON:  Prior CT from earlier same day as well as prior MRI from 04/09/2012. FINDINGS: Alignment: Straightening with mild reversal of the normal cervical lordosis. Trace anterolisthesis of C2 on C3, with trace retrolisthesis of C3 on C4. 2 mm retrolisthesis of C5 on C6, with trace retrolisthesis of C6 on C7. Vertebrae: Prior ACDF at C4-5 with solid arthrodesis. Vertebral body height maintained. Bone marrow signal intensity within normal limits. No discrete or worrisome osseous lesions. Discogenic reactive endplate change present about the C3-4 interspace. No other abnormal marrow edema. Cord: Suspected subtle patchy cord signal abnormality at the level of C3-4, likely compressive myelomalacia of due to stenosis at this level (series 5, image 10). Signal intensity within the visualized cord otherwise within normal limits. Posterior Fossa, vertebral arteries, paraspinal tissues: Unremarkable. Disc levels: C2-C3: Small left paracentral disc protrusion indents the ventral thecal sac. Mild left-sided facet hypertrophy. No significant spinal stenosis. Foramina remain patent. C3-C4: Degenerative intervertebral disc space narrowing. Broad-based central disc protrusion indents and effaces the ventral thecal sac, contacting and  flattening the cord at this level. Associated subtle patchy cord signal changes suspicious for myelomalacia. Severe spinal stenosis with the thecal sac measuring 4-5 mm in AP diameter at its most narrow point. Severe bilateral C4 foraminal narrowing. C4-C5:  Prior fusion.  No residual canal or foraminal stenosis. C5-C6: Broad-based posterior disc osteophyte complex flattens and partially faces the ventral thecal sac. Mild cord flattening without cord signal changes. Resultant moderate to severe spinal stenosis. Left worse than right uncovertebral spurring with resultant severe left worse than right C6 foraminal stenosis. C6-C7: Small left paracentral disc protrusion indents the ventral thecal sac (series 8, image 34). Mild spinal stenosis. Superimposed uncovertebral spurring with resultant mild bilateral C7 foraminal stenosis. C7-T1:  Unremarkable. Visualized upper thoracic spine demonstrates no significant finding. IMPRESSION: 1. Broad-based central disc protrusion at C3-4 with resultant severe spinal stenosis and flattening of the cervical spinal cord. Associated subtle patchy cord signal abnormality suspicious for associated compressive myelomalacia. 2. Broad-based posterior disc osteophyte complex at C5-6 with resultant moderate to severe spinal stenosis, with severe left worse than right C6 foraminal narrowing. 3. Small left paracentral disc protrusion at C6-7 with resultant mild canal and bilateral C7 foraminal stenosis. 4. Prior ACDF at C4-5 without residual stenosis. Electronically Signed   By: Jeannine Boga M.D.   On: 03/03/2022 00:15   MR BRAIN WO CONTRAST  Result Date: 03/03/2022 CLINICAL DATA:  Initial evaluation for neuro deficit, stroke suspected. EXAM: MRI HEAD WITHOUT CONTRAST TECHNIQUE: Multiplanar, multiecho pulse sequences of the brain and surrounding structures  were obtained without intravenous contrast. COMPARISON:  Prior CT from earlier the same day. FINDINGS: Brain: Cerebral volume  within normal limits for patient age. No focal parenchymal signal abnormality identified. No abnormal foci of restricted diffusion to suggest acute or subacute ischemia. Gray-white matter differentiation well maintained. No encephalomalacia to suggest chronic infarction. No foci of susceptibility artifact to suggest acute or chronic intracranial hemorrhage. No mass lesion, midline shift or mass effect. No hydrocephalus. No extra-axial fluid collection. Pituitary gland and suprasellar region are normal. Midline structures intact and normal. Vascular: Major intracranial vascular flow voids well maintained. Skull and upper cervical spine: Craniocervical junction within normal limits. Postsurgical changes partially visualize within the upper cervical spine. Bone marrow signal intensity normal. No scalp soft tissue abnormality. Sinuses/Orbits: Globes and orbital soft tissues within normal limits. Paranasal sinuses are clear.  No significant mastoid effusion. Other: None. IMPRESSION: Normal brain MRI. No acute intracranial abnormality identified. Electronically Signed   By: Jeannine Boga M.D.   On: 03/03/2022 00:08   CT Head Wo Contrast  Result Date: 03/02/2022 CLINICAL DATA:  Syncope/presyncope, cerebrovascular cause suspected; Neck trauma, dangerous injury mechanism (Age 3-64y) EXAM: CT HEAD WITHOUT CONTRAST CT CERVICAL SPINE WITHOUT CONTRAST TECHNIQUE: Multidetector CT imaging of the head and cervical spine was performed following the standard protocol without intravenous contrast. Multiplanar CT image reconstructions of the cervical spine were also generated. RADIATION DOSE REDUCTION: This exam was performed according to the departmental dose-optimization program which includes automated exposure control, adjustment of the mA and/or kV according to patient size and/or use of iterative reconstruction technique. COMPARISON:  None Available. FINDINGS: CT HEAD FINDINGS Brain: No evidence of acute infarction,  hemorrhage, hydrocephalus, extra-axial collection or mass lesion/mass effect. Vascular: No hyperdense vessel or unexpected calcification. Skull: Normal. Negative for fracture or focal lesion. Sinuses/Orbits: No acute finding. Other: Negative for scalp hematoma. CT CERVICAL SPINE FINDINGS Alignment: Facet joints are aligned without dislocation or traumatic listhesis. Dens and lateral masses are aligned. Straightening of the cervical lordosis. Skull base and vertebrae: Prior C4-5 ACDF with solid ankylosis. No acute fracture. No pathologic bone process identified. Soft tissues and spinal canal: No prevertebral fluid or swelling. No visible canal hematoma. Disc levels:  Degenerative disc disease at C3-4 and C5-6. Upper chest: Negative. Other: None. IMPRESSION: 1. No acute intracranial abnormality. 2. No acute fracture or subluxation of the cervical spine. 3. Prior C4-5 ACDF with solid ankylosis. Adjacent segment disease at C3-4 and C5-6. Electronically Signed   By: Davina Poke D.O.   On: 03/02/2022 13:01   CT Cervical Spine Wo Contrast  Result Date: 03/02/2022 CLINICAL DATA:  Syncope/presyncope, cerebrovascular cause suspected; Neck trauma, dangerous injury mechanism (Age 7-64y) EXAM: CT HEAD WITHOUT CONTRAST CT CERVICAL SPINE WITHOUT CONTRAST TECHNIQUE: Multidetector CT imaging of the head and cervical spine was performed following the standard protocol without intravenous contrast. Multiplanar CT image reconstructions of the cervical spine were also generated. RADIATION DOSE REDUCTION: This exam was performed according to the departmental dose-optimization program which includes automated exposure control, adjustment of the mA and/or kV according to patient size and/or use of iterative reconstruction technique. COMPARISON:  None Available. FINDINGS: CT HEAD FINDINGS Brain: No evidence of acute infarction, hemorrhage, hydrocephalus, extra-axial collection or mass lesion/mass effect. Vascular: No hyperdense  vessel or unexpected calcification. Skull: Normal. Negative for fracture or focal lesion. Sinuses/Orbits: No acute finding. Other: Negative for scalp hematoma. CT CERVICAL SPINE FINDINGS Alignment: Facet joints are aligned without dislocation or traumatic listhesis. Dens and lateral masses are aligned. Straightening  of the cervical lordosis. Skull base and vertebrae: Prior C4-5 ACDF with solid ankylosis. No acute fracture. No pathologic bone process identified. Soft tissues and spinal canal: No prevertebral fluid or swelling. No visible canal hematoma. Disc levels:  Degenerative disc disease at C3-4 and C5-6. Upper chest: Negative. Other: None. IMPRESSION: 1. No acute intracranial abnormality. 2. No acute fracture or subluxation of the cervical spine. 3. Prior C4-5 ACDF with solid ankylosis. Adjacent segment disease at C3-4 and C5-6. Electronically Signed   By: Davina Poke D.O.   On: 03/02/2022 13:01     Time coordinating discharge: Over 65 minutes    Dwyane Dee, MD  [facility] 03/09/2022, 4:34 PM

## 2022-03-09 NOTE — TOC Transition Note (Signed)
Transition of Care [facility]) - CM/SW Discharge Note   Patient Details  Name: Alexis English MRN: [id number] Date of Birth: 1966-07-25  Transition of Care [facility]) CM/SW Contact:  Kermit Balo, RN Phone Number: 03/09/2022, 10:29 AM   Clinical Narrative:    Patient is discharging home with self care. No f/u per PT/OT. DME for home ordered through [employer] and will be delivered to the room.  Pt states she has good support at home and transportation to home.   Final next level of care: Home/Self Care Barriers to Discharge: No Barriers Identified   Patient Goals and CMS Choice     Choice offered to / list presented to : Patient  Discharge Placement                       Discharge Plan and Services                DME Arranged: 3-N-1, Walker rolling DME Agency: [employer] Date DME Agency Contacted: 03/09/22   Representative spoke with at DME Agency: Delorise Jackson            Social Determinants of Health (SDOH) Interventions     Readmission Risk Interventions     No data to display

## 2022-04-07 ENCOUNTER — Encounter: Payer: Self-pay | Admitting: *Deleted

## 2022-04-07 ENCOUNTER — Other Ambulatory Visit: Payer: Self-pay | Admitting: Medical

## 2022-04-21 ENCOUNTER — Encounter: Payer: Self-pay | Admitting: Family

## 2022-04-21 ENCOUNTER — Other Ambulatory Visit: Payer: Self-pay | Admitting: Medical

## 2022-04-21 MED ORDER — AMLODIPINE BESYLATE 10 MG PO TABS: 10.0000 mg | ORAL_TABLET | Freq: Every day | ORAL | 0 refills | Status: DC

## 2022-04-26 DIAGNOSIS — G959 Disease of spinal cord, unspecified: Secondary | ICD-10-CM | POA: Diagnosis not present

## 2022-05-03 ENCOUNTER — Encounter: Payer: Self-pay | Admitting: Physical Therapy

## 2022-05-03 ENCOUNTER — Ambulatory Visit: Payer: BC Managed Care – PPO | Attending: Neurological Surgery | Admitting: Physical Therapy

## 2022-05-03 DIAGNOSIS — R252 Cramp and spasm: Secondary | ICD-10-CM | POA: Insufficient documentation

## 2022-05-03 DIAGNOSIS — M542 Cervicalgia: Secondary | ICD-10-CM | POA: Diagnosis not present

## 2022-05-03 NOTE — Therapy (Signed)
OUTPATIENT PHYSICAL THERAPY CERVICAL EVALUATION   Patient Name: Alexis English MRN: [id number] DOB:1966-06-11, 56 y.o., female Today's Date: 05/03/2022   PT End of Session - 05/03/22 1020     Visit Number 1    Number of Visits 30    Date for PT Re-Evaluation 08/03/22    Authorization Type BCBS    PT Start Time 1015    PT Stop Time 1110    PT Time Calculation (min) 55 min    Activity Tolerance Patient tolerated treatment well    Behavior During Therapy [facility] for tasks assessed/performed             Past Medical History:  Diagnosis Date   Anxiety    Depression    History of kidney stones    Hypertension    Pneumonia 08/2010   Pulmonary nodule 08/2010   per CT   Ruptured disc, cervical    C4-5; cervical fusion   Smoker    Past Surgical History:  Procedure Laterality Date   ABDOMINAL HERNIA REPAIR     as a child   ANTERIOR CERVICAL DECOMP/DISCECTOMY FUSION N/A 03/06/2022   Procedure: CERVICAL THREE-FOUR, CERVICAL FIVE-SIX ANTERIOR CERVICAL DECOMPRESSION/DISCECTOMY FUSION WITH REMOVAL OF CERVICAL FOUR-FIVE PLATE;  Surgeon: Karsten Ro, DO;  Location: [place];  Service: Neurosurgery;  Laterality: N/A;   CERVICAL FUSION  2000   CESAREAN SECTION     x2   CYSTECTOMY     from back x 2   TONSILLECTOMY     Patient Active Problem List   Diagnosis Date Noted   Cervical spinal stenosis 03/03/2022   Syncope 03/02/2022   Tobacco abuse 01/22/2017   Preventative health care 03/13/2016   Cervical radiculopathy 03/26/2012   Depression with anxiety 09/05/2010   Pulmonary nodule 09/05/2010   NECK PAIN, CHRONIC 09/05/2010   HTN (hypertension) 09/05/2010    PCP: Inda Castle   REFERRING PROVIDER: [facility]  REFERRING DIAG: s/p cervical fusion C3-6  THERAPY DIAG:  Cervicalgia  Cramp and spasm  Rationale for Evaluation and Treatment Rehabilitation  ONSET DATE: 03/06/22  SUBJECTIVE:                                                                                                                                                                                                          SUBJECTIVE STATEMENT: Patient had a fall in the spring, has had a fusion prior but the fall really exacerbated neck pain and issues with radicular symptoms, she underwent an ACDF C3-6 03/06/22.  She reports overall she is doing better but is suffering  from pressure and tightness in the neck, the traps and having a HA most days  PERTINENT HISTORY:  As above  PAIN:  Are you having pain? Yes: NPRS scale: 5/10 Pain location: neck, base of skull and upper traps Pain description: pressure and tightness, spasm Aggravating factors: at a computer a lot pain up to 7/10 Relieving factors: pushing on the spots and pain meds help some at best pain a 3/10  PRECAUTIONS: None  WEIGHT BEARING RESTRICTIONS No  FALLS:  Has patient fallen in last 6 months? Yes. Number of falls 2  LIVING ENVIRONMENT: Lives with: lives with their family Lives in: House/apartment Stairs: Yes: Internal: 16 steps; can reach both Has following equipment at home: None  OCCUPATION: works a Teaching laboratory technician  PLOF: Independent, some [place] have less pain  OBJECTIVE:   PATIENT SURVEYS:  FOTO 35   COGNITION: Overall cognitive status: Within functional limits for tasks assessed   SENSATION: Some tingling in the deltoid area and the tips of the thumbs  POSTURE: rounded shoulders and forward head  PALPATION: Very tight upper traps in the neck area, tender   CERVICAL ROM:   Active ROM A/PROM (deg) eval  Flexion Decreased 25%  Extension Decreased 50%  Right lateral flexion Decreased 75%  Left lateral flexion Decreased 75%  Right rotation Decreased 50%  Left rotation Decreased 50%   (Blank rows = not tested)  UPPER EXTREMITY ROM:  WFL's UPPER EXTREMITY MMT:  4/5 with some tightness and pain in the neck and traps   TODAY'S TREATMENT:  Educated on DN, performed DN to the upper traps with  good LTR's, STM to the traps and neck   PATIENT EDUCATION:  Education details: POC Person educated: Patient Education method: Explanation Education comprehension: verbalized understanding   HOME EXERCISE PROGRAM: Shrugs and retractions scapular  ASSESSMENT:  CLINICAL IMPRESSION: Patient is a 56 y.o. female who was seen today for physical therapy evaluation and treatment for neck pain and spasms after ACDF C3-6.  She is having significant pressure and spasms in the upper traps and the cervical area with HA's, she is very tight, decreased ROM and guarding.    OBJECTIVE IMPAIRMENTS decreased activity tolerance, decreased ROM, decreased strength, increased fascial restrictions, increased muscle spasms, impaired flexibility, impaired sensation, improper body mechanics, postural dysfunction, and pain.   REHAB POTENTIAL: Good  CLINICAL DECISION MAKING: Stable/uncomplicated  EVALUATION COMPLEXITY: Low   GOALS: Goals reviewed with patient? Yes  SHORT TERM GOALS: Target date: 05/17/22   Independent with initial HEP Goal status: INITIAL  LONG TERM GOALS: Target date: 07/26/22  Understand proper posture and body mechanics Goal status: INITIAL  2.  Decrease pain 50% Goal status: INITIAL  3.  Decrease HA frequency 50% Goal status: INITIAL  4.  Increase UE strength to 4+/5 Goal status: INITIAL  PLAN: PT FREQUENCY: 1-2x/week  PT DURATION: 12 weeks  PLANNED INTERVENTIONS: Therapeutic exercises, Therapeutic activity, Neuromuscular re-education, Balance training, Gait training, Patient/Family education, Self Care, Joint mobilization, Dry Needling, Electrical stimulation, Cryotherapy, Moist heat, Taping, and Manual therapy  PLAN FOR NEXT SESSION: see how th treatment did, gently start scapular exercises and exercises to relax the upper traps   Kaedin Hicklin W, PT 05/03/2022, 10:21 AM

## 2022-05-03 NOTE — Addendum Note (Signed)
Addended by: Sumner Boast on: 05/03/2022 12:02 PM   Modules accepted: Orders

## 2022-05-10 ENCOUNTER — Ambulatory Visit: Payer: BC Managed Care – PPO

## 2022-05-11 ENCOUNTER — Encounter: Payer: Self-pay | Admitting: Physical Therapy

## 2022-05-11 ENCOUNTER — Ambulatory Visit: Payer: BC Managed Care – PPO | Admitting: Physical Therapy

## 2022-05-11 DIAGNOSIS — M542 Cervicalgia: Secondary | ICD-10-CM | POA: Diagnosis not present

## 2022-05-11 DIAGNOSIS — R252 Cramp and spasm: Secondary | ICD-10-CM | POA: Diagnosis not present

## 2022-05-11 NOTE — Therapy (Signed)
OUTPATIENT PHYSICAL THERAPY CERVICAL EVALUATION   Patient Name: Alexis English MRN: [id number] DOB:1965/12/29, 56 y.o., female Today's Date: 05/11/2022   PT End of Session - 05/11/22 1013     Visit Number 2    Date for PT Re-Evaluation 08/03/22    PT Start Time 1015    PT Stop Time 1100    PT Time Calculation (min) 45 min    Activity Tolerance Patient tolerated treatment well    Behavior During Therapy [facility] for tasks assessed/performed             Past Medical History:  Diagnosis Date   Anxiety    Depression    History of kidney stones    Hypertension    Pneumonia 08/2010   Pulmonary nodule 08/2010   per CT   Ruptured disc, cervical    C4-5; cervical fusion   Smoker    Past Surgical History:  Procedure Laterality Date   ABDOMINAL HERNIA REPAIR     as a child   ANTERIOR CERVICAL DECOMP/DISCECTOMY FUSION N/A 03/06/2022   Procedure: CERVICAL THREE-FOUR, CERVICAL FIVE-SIX ANTERIOR CERVICAL DECOMPRESSION/DISCECTOMY FUSION WITH REMOVAL OF CERVICAL FOUR-FIVE PLATE;  Surgeon: Karsten Ro, DO;  Location: [place];  Service: Neurosurgery;  Laterality: N/A;   CERVICAL FUSION  2000   CESAREAN SECTION     x2   CYSTECTOMY     from back x 2   TONSILLECTOMY     Patient Active Problem List   Diagnosis Date Noted   Cervical spinal stenosis 03/03/2022   Syncope 03/02/2022   Tobacco abuse 01/22/2017   Preventative health care 03/13/2016   Cervical radiculopathy 03/26/2012   Depression with anxiety 09/05/2010   Pulmonary nodule 09/05/2010   NECK PAIN, CHRONIC 09/05/2010   HTN (hypertension) 09/05/2010    PCP: Inda Castle   REFERRING PROVIDER: [facility]  REFERRING DIAG: s/p cervical fusion C3-6  THERAPY DIAG:  Cervicalgia  Cramp and spasm  Rationale for Evaluation and Treatment Rehabilitation  ONSET DATE: 03/06/22  SUBJECTIVE:                                                                                                                                                                                                          SUBJECTIVE STATEMENT:   Yesterday was a good day, today not so great Tightness in shoulders and neck.  PERTINENT HISTORY:  As above  PAIN:  Are you having pain? Yes: NPRS scale: 7/10 Pain location: neck, base of skull and upper traps Pain description: pressure and tightness, spasm Aggravating factors: at a computer a  lot pain up to 7/10 Relieving factors: pushing on the spots and pain meds help some at best pain a 3/10  PRECAUTIONS: None  WEIGHT BEARING RESTRICTIONS No  FALLS:  Has patient fallen in last 6 months? Yes. Number of falls 2  LIVING ENVIRONMENT: Lives with: lives with their family Lives in: House/apartment Stairs: Yes: Internal: 16 steps; can reach both Has following equipment at home: None  OCCUPATION: works a Teaching laboratory technician  PLOF: Independent, some [place] have less pain  OBJECTIVE:     SENSATION: Some tingling in the deltoid area and the tips of the thumbs  POSTURE: rounded shoulders and forward head  PALPATION: Very tight upper traps in the neck area, tender   CERVICAL ROM:   Active ROM A/PROM (deg) eval  Flexion Decreased 25%  Extension Decreased 50%  Right lateral flexion Decreased 75%  Left lateral flexion Decreased 75%  Right rotation Decreased 50%  Left rotation Decreased 50%   (Blank rows = not tested)  UPPER EXTREMITY ROM:  WFL's UPPER EXTREMITY MMT:  4/5 with some tightness and pain in the neck and traps   TODAY'S TREATMENT:  05/11/22 AAROM 1lb WaTE flex, Ext, IR up back  Seated Rows Red 2x10  ER yellow 2x10 Shoulder Ext yellow 2x10 UBE L1 x 2 each  STM to UT, posterior cervical para spinales,SCM  Educated on DN, performed DN to the upper traps with good LTR's, STM to the traps and neck   PATIENT EDUCATION:  Education details: POC Person educated: Patient Education method: Explanation Education comprehension: verbalized understanding   HOME  EXERCISE PROGRAM: Shrugs and retractions scapular  ASSESSMENT:  CLINICAL IMPRESSION: Pt enters with reports of neck and shoulder tightness. Pt was introduced to some light scapular strengthening interventions without issue. Increase tightness reproted at the base of her skull with UBE. Positive response to MT.      OBJECTIVE IMPAIRMENTS decreased activity tolerance, decreased ROM, decreased strength, increased fascial restrictions, increased muscle spasms, impaired flexibility, impaired sensation, improper body mechanics, postural dysfunction, and pain.   REHAB POTENTIAL: Good  CLINICAL DECISION MAKING: Stable/uncomplicated  EVALUATION COMPLEXITY: Low   GOALS: Goals reviewed with patient? Yes  SHORT TERM GOALS: Target date: 05/17/22   Independent with initial HEP Goal status: INITIAL  LONG TERM GOALS: Target date: 07/26/22  Understand proper posture and body mechanics Goal status: INITIAL  2.  Decrease pain 50% Goal status: INITIAL  3.  Decrease HA frequency 50% Goal status: INITIAL  4.  Increase UE strength to 4+/5 Goal status: INITIAL  PLAN: PT FREQUENCY: 1-2x/week  PT DURATION: 12 weeks  PLANNED INTERVENTIONS: Therapeutic exercises, Therapeutic activity, Neuromuscular re-education, Balance training, Gait training, Patient/Family education, Self Care, Joint mobilization, Dry Needling, Electrical stimulation, Cryotherapy, Moist heat, Taping, and Manual therapy  PLAN FOR NEXT SESSION: see how th treatment did, gently start scapular exercises and exercises to relax the upper traps   Scot Jun, PTA 05/11/2022, 10:14 AM

## 2022-05-17 ENCOUNTER — Ambulatory Visit: Payer: BC Managed Care – PPO | Admitting: Physical Therapy

## 2022-05-17 ENCOUNTER — Encounter: Payer: Self-pay | Admitting: Physical Therapy

## 2022-05-17 DIAGNOSIS — R252 Cramp and spasm: Secondary | ICD-10-CM

## 2022-05-17 DIAGNOSIS — M542 Cervicalgia: Secondary | ICD-10-CM

## 2022-05-17 NOTE — Therapy (Signed)
OUTPATIENT PHYSICAL THERAPY CERVICAL EVALUATION   Patient Name: Alexis English MRN: [id number] DOB:[birth year], 56 y.o., female Today's Date: 05/17/2022   PT End of Session - 05/17/22 1015     Visit Number 3    Date for PT Re-Evaluation 08/03/22    PT Start Time 1015    PT Stop Time 1100    PT Time Calculation (min) 45 min    Activity Tolerance Patient tolerated treatment well    Behavior During Therapy [facility] for tasks assessed/performed             Past Medical History:  Diagnosis Date   Anxiety    Depression    History of kidney stones    Hypertension    Pneumonia 08/2010   Pulmonary nodule 08/2010   per CT   Ruptured disc, cervical    C4-5; cervical fusion   Smoker    Past Surgical History:  Procedure Laterality Date   ABDOMINAL HERNIA REPAIR     as a child   ANTERIOR CERVICAL DECOMP/DISCECTOMY FUSION N/A 03/06/2022   Procedure: CERVICAL THREE-FOUR, CERVICAL FIVE-SIX ANTERIOR CERVICAL DECOMPRESSION/DISCECTOMY FUSION WITH REMOVAL OF CERVICAL FOUR-FIVE PLATE;  Surgeon: Karsten Ro, DO;  Location: [place];  Service: Neurosurgery;  Laterality: N/A;   CERVICAL FUSION  2000   CESAREAN SECTION     x2   CYSTECTOMY     from back x 2   TONSILLECTOMY     Patient Active Problem List   Diagnosis Date Noted   Cervical spinal stenosis 03/03/2022   Syncope 03/02/2022   Tobacco abuse 01/22/2017   Preventative health care 03/13/2016   Cervical radiculopathy 03/26/2012   Depression with anxiety 09/05/2010   Pulmonary nodule 09/05/2010   NECK PAIN, CHRONIC 09/05/2010   HTN (hypertension) 09/05/2010    PCP: Nelson Chimes PROVIDER: [facility]  REFERRING DIAG: s/p cervical fusion C3-6  THERAPY DIAG:  Cervicalgia  Cramp and spasm  Rationale for Evaluation and Treatment Rehabilitation  ONSET DATE: 03/06/22  SUBJECTIVE:                                                                                                                                                                                                          SUBJECTIVE STATEMENT: Have felt pretty good,   PERTINENT HISTORY:  As above  PAIN:  Are you having pain? Yes: NPRS scale: 4/10 Pain location: neck upper L trap Pain description: pressure and tightness, spasm Aggravating factors: tension Relieving factors: pushing on the spots and pain meds help some at best pain a 3/10  PRECAUTIONS: None  WEIGHT BEARING RESTRICTIONS No  FALLS:  Has patient fallen in last 6 months? Yes. Number of falls 2  LIVING ENVIRONMENT: Lives with: lives with their family Lives in: House/apartment Stairs: Yes: Internal: 16 steps; can reach both Has following equipment at home: None  OCCUPATION: works a Teaching laboratory technician  PLOF: Independent, some [place] have less pain  OBJECTIVE:     SENSATION: Some tingling in the deltoid area and the tips of the thumbs  POSTURE: rounded shoulders and forward head  PALPATION: Very tight upper traps in the neck area, tender   CERVICAL ROM:   Active ROM A/PROM (deg) eval  Flexion Decreased 25%  Extension Decreased 50%  Right lateral flexion Decreased 75%  Left lateral flexion Decreased 75%  Right rotation Decreased 50%  Left rotation Decreased 50%   (Blank rows = not tested)  UPPER EXTREMITY ROM:  WFL's UPPER EXTREMITY MMT:  4/5 with some tightness and pain in the neck and traps   TODAY'S TREATMENT:  05/17/22 NuStep L3 x6 min Rows & Lats 20lb 2x10 Shoulder Ext 5lb 2x10 Shoulder Er red 2x10 OHP yellow 2x10  STM to UT, posterior cervical para spinales,SCM  05/11/22 AAROM 1lb WaTE flex, Ext, IR up back  Seated Rows Red 2x10  ER yellow 2x10 Shoulder Ext yellow 2x10 UBE L1 x 2 each  STM to UT, posterior cervical para spinales,SCM  Educated on DN, performed DN to the upper traps with good LTR's, STM to the traps and neck   PATIENT EDUCATION:  Education details: POC Person educated: Patient Education method:  Explanation Education comprehension: verbalized understanding   HOME EXERCISE PROGRAM: Shrugs and retractions scapular  ASSESSMENT:  CLINICAL IMPRESSION: Pt enters with reports of neck and shoulder tightness. Lead PT assisted in treatment providing pt with DN. Pt able to progress to machine level postural strengthening. Tightness present with STM. Positive response to MT   OBJECTIVE IMPAIRMENTS decreased activity tolerance, decreased ROM, decreased strength, increased fascial restrictions, increased muscle spasms, impaired flexibility, impaired sensation, improper body mechanics, postural dysfunction, and pain.   REHAB POTENTIAL: Good  CLINICAL DECISION MAKING: Stable/uncomplicated  EVALUATION COMPLEXITY: Low   GOALS: Goals reviewed with patient? Yes  SHORT TERM GOALS: Target date: 05/17/22   Independent with initial HEP Goal status: Met  LONG TERM GOALS: Target date: 07/26/22  Understand proper posture and body mechanics Goal status: Progressing  2.  Decrease pain 50% Goal status: INITIAL  3.  Decrease HA frequency 50% Goal status: INITIAL  4.  Increase UE strength to 4+/5 Goal status: INITIAL  PLAN: PT FREQUENCY: 1-2x/week  PT DURATION: 12 weeks  PLANNED INTERVENTIONS: Therapeutic exercises, Therapeutic activity, Neuromuscular re-education, Balance training, Gait training, Patient/Family education, Self Care, Joint mobilization, Dry Needling, Electrical stimulation, Cryotherapy, Moist heat, Taping, and Manual therapy  PLAN FOR NEXT SESSION: see how th treatment did, gently start scapular exercises and exercises to relax the upper traps   Scot Jun, PTA 05/17/2022, 10:16 AM

## 2022-05-19 ENCOUNTER — Other Ambulatory Visit: Payer: Self-pay | Admitting: Family

## 2022-05-23 ENCOUNTER — Ambulatory Visit: Payer: BC Managed Care – PPO | Attending: Neurological Surgery | Admitting: Physical Therapy

## 2022-05-23 ENCOUNTER — Encounter: Payer: Self-pay | Admitting: Physical Therapy

## 2022-05-23 DIAGNOSIS — R252 Cramp and spasm: Secondary | ICD-10-CM | POA: Insufficient documentation

## 2022-05-23 DIAGNOSIS — M542 Cervicalgia: Secondary | ICD-10-CM | POA: Insufficient documentation

## 2022-05-23 NOTE — Therapy (Signed)
OUTPATIENT PHYSICAL THERAPY CERVICAL EVALUATION   Patient Name: Alexis English MRN: [id number] DOB:1966-07-18, 56 y.o., female Today's Date: 05/23/2022   PT End of Session - 05/23/22 0843     Visit Number 4    Date for PT Re-Evaluation 08/03/22    Authorization Type BCBS    PT Start Time 0840    PT Stop Time 0935    PT Time Calculation (min) 55 min    Activity Tolerance Patient tolerated treatment well    Behavior During Therapy [facility] for tasks assessed/performed             Past Medical History:  Diagnosis Date   Anxiety    Depression    History of kidney stones    Hypertension    Pneumonia 08/2010   Pulmonary nodule 08/2010   per CT   Ruptured disc, cervical    C4-5; cervical fusion   Smoker    Past Surgical History:  Procedure Laterality Date   ABDOMINAL HERNIA REPAIR     as a child   ANTERIOR CERVICAL DECOMP/DISCECTOMY FUSION N/A 03/06/2022   Procedure: CERVICAL THREE-FOUR, CERVICAL FIVE-SIX ANTERIOR CERVICAL DECOMPRESSION/DISCECTOMY FUSION WITH REMOVAL OF CERVICAL FOUR-FIVE PLATE;  Surgeon: Karsten Ro, DO;  Location: [place];  Service: Neurosurgery;  Laterality: N/A;   CERVICAL FUSION  2000   CESAREAN SECTION     x2   CYSTECTOMY     from back x 2   TONSILLECTOMY     Patient Active Problem List   Diagnosis Date Noted   Cervical spinal stenosis 03/03/2022   Syncope 03/02/2022   Tobacco abuse 01/22/2017   Preventative health care 03/13/2016   Cervical radiculopathy 03/26/2012   Depression with anxiety 09/05/2010   Pulmonary nodule 09/05/2010   NECK PAIN, CHRONIC 09/05/2010   HTN (hypertension) 09/05/2010    PCP: Inda Castle   REFERRING PROVIDER: [facility]  REFERRING DIAG: s/p cervical fusion C3-6  THERAPY DIAG:  Cervicalgia  Cramp and spasm  Rationale for Evaluation and Treatment Rehabilitation  ONSET DATE: 03/06/22  SUBJECTIVE:                                                                                                                                                                                                          SUBJECTIVE STATEMENT: I had really bad aches the night we did the needles, but I have been feeling better since.  I am stiff and sore  PERTINENT HISTORY:  As above  PAIN:  Are you having pain? Yes: NPRS scale: 4/10 Pain location: neck upper L trap Pain  description: pressure and tightness, spasm Aggravating factors: tension Relieving factors: pushing on the spots and pain meds help some at best pain a 3/10  PRECAUTIONS: None  WEIGHT BEARING RESTRICTIONS No  FALLS:  Has patient fallen in last 6 months? Yes. Number of falls 2  LIVING ENVIRONMENT: Lives with: lives with their family Lives in: House/apartment Stairs: Yes: Internal: 16 steps; can reach both Has following equipment at home: None  OCCUPATION: works a Teaching laboratory technician  PLOF: Independent, some [place] have less pain  OBJECTIVE:     SENSATION: Some tingling in the deltoid area and the tips of the thumbs  POSTURE: rounded shoulders and forward head  PALPATION: Very tight upper traps in the neck area, tender   CERVICAL ROM:   Active ROM A/PROM (deg) eval  Flexion Decreased 25%  Extension Decreased 50%  Right lateral flexion Decreased 75%  Left lateral flexion Decreased 75%  Right rotation Decreased 50%  Left rotation Decreased 50%   (Blank rows = not tested)  UPPER EXTREMITY ROM:  WFL's UPPER EXTREMITY MMT:  4/5 with some tightness and pain in the neck and traps   TODAY'S TREATMENT:  05/23/22 UBE level 3 x 4 minutes Rows and lats 20# 2x10 Red tband ER 4# shrugs with upper trap and levator stretches  Cervical retraction DN to the upper traps and the sub occioitals STM tot he upper traps, the cervical pspinals and the SCM MHP/IFC to the C/T area in supine  05/17/22 NuStep L3 x6 min Rows & Lats 20lb 2x10 Shoulder Ext 5lb 2x10 Shoulder Er red 2x10 OHP yellow 2x10  STM to UT, posterior cervical para  spinales,SCM  05/11/22 AAROM 1lb WaTE flex, Ext, IR up back  Seated Rows Red 2x10  ER yellow 2x10 Shoulder Ext yellow 2x10 UBE L1 x 2 each  STM to UT, posterior cervical para spinales,SCM  Educated on DN, performed DN to the upper traps with good LTR's, STM to the traps and neck   PATIENT EDUCATION:  Education details: POC Person educated: Patient Education method: Explanation Education comprehension: verbalized understanding   HOME EXERCISE PROGRAM: Shrugs and retractions scapular  ASSESSMENT:  CLINICAL IMPRESSION: Patient had increased soreness after the last DN session, we did this again but this time ended with MHP/IFC to help with soreness, I added DN to the suboccipitals, added some STM to the SCM, encouraged movements and her trying to relax and catch herself when she is elevating OBJECTIVE IMPAIRMENTS decreased activity tolerance, decreased ROM, decreased strength, increased fascial restrictions, increased muscle spasms, impaired flexibility, impaired sensation, improper body mechanics, postural dysfunction, and pain.   REHAB POTENTIAL: Good  CLINICAL DECISION MAKING: Stable/uncomplicated  EVALUATION COMPLEXITY: Low   GOALS: Goals reviewed with patient? Yes  SHORT TERM GOALS: Target date: 05/17/22   Independent with initial HEP Goal status: Met  LONG TERM GOALS: Target date: 07/26/22  Understand proper posture and body mechanics Goal status: Progressing  2.  Decrease pain 50% Goal status: ongoing  3.  Decrease HA frequency 50% Goal status: ongoing  4.  Increase UE strength to 4+/5 Goal status: INITIAL  PLAN: PT FREQUENCY: 1-2x/week  PT DURATION: 12 weeks  PLANNED INTERVENTIONS: Therapeutic exercises, Therapeutic activity, Neuromuscular re-education, Balance training, Gait training, Patient/Family education, Self Care, Joint mobilization, Dry Needling, Electrical stimulation, Cryotherapy, Moist heat, Taping, and Manual therapy  PLAN FOR NEXT  SESSION: see how th treatment did, gently start scapular exercises and exercises to relax the upper traps   Clotilde Loth W, PT 05/23/2022, 8:45  AM

## 2022-06-02 ENCOUNTER — Other Ambulatory Visit: Payer: Self-pay | Admitting: Family

## 2022-06-02 ENCOUNTER — Ambulatory Visit
Admission: RE | Admit: 2022-06-02 | Discharge: 2022-06-02 | Disposition: A | Discharge status: Home / Self Care | Payer: BC Managed Care – PPO | Source: Ambulatory Visit | Attending: Family | Admitting: Family

## 2022-06-02 DIAGNOSIS — R922 Inconclusive mammogram: Secondary | ICD-10-CM | POA: Diagnosis not present

## 2022-06-02 DIAGNOSIS — N6312 Unspecified lump in the right breast, upper inner quadrant: Secondary | ICD-10-CM | POA: Diagnosis not present

## 2022-06-02 DIAGNOSIS — R928 Other abnormal and inconclusive findings on diagnostic imaging of breast: Secondary | ICD-10-CM

## 2022-06-02 DIAGNOSIS — N6310 Unspecified lump in the right breast, unspecified quadrant: Secondary | ICD-10-CM

## 2022-06-02 DIAGNOSIS — N6311 Unspecified lump in the right breast, upper outer quadrant: Secondary | ICD-10-CM | POA: Diagnosis not present

## 2022-06-13 ENCOUNTER — Ambulatory Visit: Payer: BC Managed Care – PPO | Admitting: Physical Therapy

## 2022-06-13 ENCOUNTER — Encounter: Payer: Self-pay | Admitting: Physical Therapy

## 2022-06-13 DIAGNOSIS — R252 Cramp and spasm: Secondary | ICD-10-CM

## 2022-06-13 DIAGNOSIS — M542 Cervicalgia: Secondary | ICD-10-CM

## 2022-06-13 NOTE — Therapy (Signed)
OUTPATIENT PHYSICAL THERAPY CERVICAL EVALUATION   Patient Name: Alexis English MRN: [id number] DOB:[birth year], 56 y.o., female Today's Date: 06/13/2022   PT End of Session - 06/13/22 1100     Visit Number 5    Date for PT Re-Evaluation 08/03/22    PT Start Time 81    PT Stop Time 1594    PT Time Calculation (min) 45 min    Activity Tolerance Patient tolerated treatment well    Behavior During Therapy [facility] for tasks assessed/performed             Past Medical History:  Diagnosis Date   Anxiety    Depression    History of kidney stones    Hypertension    Pneumonia 08/2010   Pulmonary nodule 08/2010   per CT   Ruptured disc, cervical    C4-5; cervical fusion   Smoker    Past Surgical History:  Procedure Laterality Date   ABDOMINAL HERNIA REPAIR     as a child   ANTERIOR CERVICAL DECOMP/DISCECTOMY FUSION N/A 03/06/2022   Procedure: CERVICAL THREE-FOUR, CERVICAL FIVE-SIX ANTERIOR CERVICAL DECOMPRESSION/DISCECTOMY FUSION WITH REMOVAL OF CERVICAL FOUR-FIVE PLATE;  Surgeon: Karsten Ro, DO;  Location: [place];  Service: Neurosurgery;  Laterality: N/A;   CERVICAL FUSION  2000   CESAREAN SECTION     x2   CYSTECTOMY     from back x 2   TONSILLECTOMY     Patient Active Problem List   Diagnosis Date Noted   Cervical spinal stenosis 03/03/2022   Syncope 03/02/2022   Tobacco abuse 01/22/2017   Preventative health care 03/13/2016   Cervical radiculopathy 03/26/2012   Depression with anxiety 09/05/2010   Pulmonary nodule 09/05/2010   NECK PAIN, CHRONIC 09/05/2010   HTN (hypertension) 09/05/2010    PCP: Nelson Chimes PROVIDER: [facility]  REFERRING DIAG: s/p cervical fusion C3-6  THERAPY DIAG:  Cervicalgia  Cramp and spasm  Rationale for Evaluation and Treatment Rehabilitation  ONSET DATE: 03/06/22  SUBJECTIVE:                                                                                                                                                                                                          SUBJECTIVE STATEMENT: "Things has been ok" Felt great after last session. Was in a spasm last week under the R shoulder blade  PERTINENT HISTORY:  As above  PAIN:  Are you having pain? Yes: NPRS scale: 4/10 Pain location: neck upper L trap Pain description: pressure and tightness, spasm Aggravating factors: tension Relieving factors:  pushing on the spots and pain meds help some at best pain a 3/10  PRECAUTIONS: None  WEIGHT BEARING RESTRICTIONS No  FALLS:  Has patient fallen in last 6 months? Yes. Number of falls 2  LIVING ENVIRONMENT: Lives with: lives with their family Lives in: House/apartment Stairs: Yes: Internal: 16 steps; can reach both Has following equipment at home: None  OCCUPATION: works a Teaching laboratory technician  PLOF: Independent, some [place] have less pain  OBJECTIVE:     SENSATION: Some tingling in the deltoid area and the tips of the thumbs  POSTURE: rounded shoulders and forward head  PALPATION: Very tight upper traps in the neck area, tender   CERVICAL ROM:   Active ROM A/PROM (deg) eval AROM 06/13/22  Flexion Decreased 25% WFL  Extension Decreased 50% WFL  Right lateral flexion Decreased 75% Decreased  50&  Left lateral flexion Decreased 75% Decreased 25%  Right rotation Decreased 50% WFL  Left rotation Decreased 50% WFL   (Blank rows = not tested)  UPPER EXTREMITY ROM:  WFL's UPPER EXTREMITY MMT:  4/5 with some tightness and pain in the neck and traps   TODAY'S TREATMENT:  06/13/22 UBE L2 x 3 min each  Shoulder ER red 2x10 Shoulder Flex 3lb 2x10 Shoulder Abd 2lb 2x10 Rows green 2x10 Shoulder ext red 2x10 Horizontal abd  yellow 2x10  STM to UT and rhomboids DN performed by Lead PT M. Albright  05/23/22 UBE level 3 x 4 minutes Rows and lats 20# 2x10 Red tband ER 4# shrugs with upper trap and levator stretches  Cervical retraction DN to the upper traps and the sub  occioitals STM tot he upper traps, the cervical pspinals and the SCM MHP/IFC to the C/T area in supine  05/17/22 NuStep L3 x6 min Rows & Lats 20lb 2x10 Shoulder Ext 5lb 2x10 Shoulder Er red 2x10 OHP yellow 2x10  STM to UT, posterior cervical para spinales,SCM  05/11/22 AAROM 1lb WaTE flex, Ext, IR up back  Seated Rows Red 2x10  ER yellow 2x10 Shoulder Ext yellow 2x10 UBE L1 x 2 each  STM to UT, posterior cervical para spinales,SCM  Educated on DN, performed DN to the upper traps with good LTR's, STM to the traps and neck   PATIENT EDUCATION:  Education details: POC Person educated: Patient Education method: Explanation Education comprehension: verbalized understanding   HOME EXERCISE PROGRAM: Shrugs and retractions scapular  ASSESSMENT:  CLINICAL IMPRESSION: Pt enters well overall. She has progressed increasing her cervical AROM in all directions. Some increase shoulder tightness reported with shoulder extensions. TP found in both rhomboid areas, positive response with MT evident by improve tissue elasticity. Lead PT assist in session with DN  OBJECTIVE IMPAIRMENTS decreased activity tolerance, decreased ROM, decreased strength, increased fascial restrictions, increased muscle spasms, impaired flexibility, impaired sensation, improper body mechanics, postural dysfunction, and pain.   REHAB POTENTIAL: Good  CLINICAL DECISION MAKING: Stable/uncomplicated  EVALUATION COMPLEXITY: Low   GOALS: Goals reviewed with patient? Yes  SHORT TERM GOALS: Target date: 05/17/22   Independent with initial HEP Goal status: Met  LONG TERM GOALS: Target date: 07/26/22  Understand proper posture and body mechanics Goal status: Progressing  2.  Decrease pain 50% Goal status: ongoing  3.  Decrease HA frequency 50% Goal status: ongoing  4.  Increase UE strength to 4+/5 Goal status: INITIAL  PLAN: PT FREQUENCY: 1-2x/week  PT DURATION: 12 weeks  PLANNED INTERVENTIONS:  Therapeutic exercises, Therapeutic activity, Neuromuscular re-education, Balance training, Gait training, Patient/Family education,  Self Care, Joint mobilization, Dry Needling, Electrical stimulation, Cryotherapy, Moist heat, Taping, and Manual therapy  PLAN FOR NEXT SESSION: see how th treatment did, gently start scapular exercises and exercises to relax the upper traps   Scot Jun, PTA 06/13/2022, 11:00 AM

## 2022-06-20 ENCOUNTER — Ambulatory Visit: Payer: BC Managed Care – PPO | Attending: Neurological Surgery | Admitting: Physical Therapy

## 2022-06-20 ENCOUNTER — Encounter: Payer: Self-pay | Admitting: Physical Therapy

## 2022-06-20 DIAGNOSIS — R252 Cramp and spasm: Secondary | ICD-10-CM | POA: Insufficient documentation

## 2022-06-20 DIAGNOSIS — M542 Cervicalgia: Secondary | ICD-10-CM | POA: Diagnosis not present

## 2022-06-20 NOTE — Therapy (Signed)
OUTPATIENT PHYSICAL THERAPY CERVICAL EVALUATION   Patient Name: Alexis English MRN: [id number] DOB:06-Sep-1966, 56 y.o., female Today's Date: 06/20/2022   PT End of Session - 06/20/22 1139     Visit Number 6    Number of Visits 30    Date for PT Re-Evaluation 08/03/22    Authorization Type [employer]    PT Start Time 1100    PT Stop Time 7353    PT Time Calculation (min) 58 min    Activity Tolerance Patient tolerated treatment well    Behavior During Therapy [facility] for tasks assessed/performed             Past Medical History:  Diagnosis Date   Anxiety    Depression    History of kidney stones    Hypertension    Pneumonia 08/2010   Pulmonary nodule 08/2010   per CT   Ruptured disc, cervical    C4-5; cervical fusion   Smoker    Past Surgical History:  Procedure Laterality Date   ABDOMINAL HERNIA REPAIR     as a child   ANTERIOR CERVICAL DECOMP/DISCECTOMY FUSION N/A 03/06/2022   Procedure: CERVICAL THREE-FOUR, CERVICAL FIVE-SIX ANTERIOR CERVICAL DECOMPRESSION/DISCECTOMY FUSION WITH REMOVAL OF CERVICAL FOUR-FIVE PLATE;  Surgeon: Karsten Ro, DO;  Location: [place];  Service: Neurosurgery;  Laterality: N/A;   CERVICAL FUSION  2000   CESAREAN SECTION     x2   CYSTECTOMY     from back x 2   TONSILLECTOMY     Patient Active Problem List   Diagnosis Date Noted   Cervical spinal stenosis 03/03/2022   Syncope 03/02/2022   Tobacco abuse 01/22/2017   Preventative health care 03/13/2016   Cervical radiculopathy 03/26/2012   Depression with anxiety 09/05/2010   Pulmonary nodule 09/05/2010   NECK PAIN, CHRONIC 09/05/2010   HTN (hypertension) 09/05/2010    PCP: Inda Castle   REFERRING PROVIDER: [facility]  REFERRING DIAG: s/p cervical fusion C3-6  THERAPY DIAG:  Cervicalgia  Cramp and spasm  Rationale for Evaluation and Treatment Rehabilitation  ONSET DATE: 03/06/22  SUBJECTIVE:                                                                                                                                                                                                          SUBJECTIVE STATEMENT: Patient reports that she has been having some increase of pain in the right scapula and into the right shoulder and upper arm, has worries about a pinched disc  PERTINENT HISTORY:  As above  PAIN:  Are you having  pain? Yes: NPRS scale: 6/10 Pain location: neck upper L trap Pain description: pressure and tightness, spasm Aggravating factors: tension Relieving factors: pushing on the spots and pain meds help some at best pain a 3/10  PRECAUTIONS: None  WEIGHT BEARING RESTRICTIONS No  FALLS:  Has patient fallen in last 6 months? Yes. Number of falls 2  LIVING ENVIRONMENT: Lives with: lives with their family Lives in: House/apartment Stairs: Yes: Internal: 16 steps; can reach both Has following equipment at home: None  OCCUPATION: works a Teaching laboratory technician  PLOF: Independent, some [place] have less pain  OBJECTIVE:     SENSATION: Some tingling in the deltoid area and the tips of the thumbs  POSTURE: rounded shoulders and forward head  PALPATION: Very tight upper traps in the neck area, tender   CERVICAL ROM:   Active ROM A/PROM (deg) eval AROM 06/13/22  Flexion Decreased 25% WFL  Extension Decreased 50% WFL  Right lateral flexion Decreased 75% Decreased  50&  Left lateral flexion Decreased 75% Decreased 25%  Right rotation Decreased 50% WFL  Left rotation Decreased 50% WFL   (Blank rows = not tested)  UPPER EXTREMITY ROM:  WFL's UPPER EXTREMITY MMT:  4/5 with some tightness and pain in the neck and traps   TODAY'S TREATMENT:  06/20/22 STM to the right cervical, right upper trap, right rhomboid, Occipital release, scapular and thoracic mobilizations MHP/IF in supine to the right upper trap and rhomboid area  06/13/22 UBE L2 x 3 min each  Shoulder ER red 2x10 Shoulder Flex 3lb 2x10 Shoulder Abd 2lb 2x10 Rows green  2x10 Shoulder ext red 2x10 Horizontal abd  yellow 2x10  STM to UT and rhomboids DN performed by Lead PT M. Simeon Vera  05/23/22 UBE level 3 x 4 minutes Rows and lats 20# 2x10 Red tband ER 4# shrugs with upper trap and levator stretches  Cervical retraction DN to the upper traps and the sub occioitals STM tot he upper traps, the cervical pspinals and the SCM MHP/IFC to the C/T area in supine  05/17/22 NuStep L3 x6 min Rows & Lats 20lb 2x10 Shoulder Ext 5lb 2x10 Shoulder Er red 2x10 OHP yellow 2x10  STM to UT, posterior cervical para spinales,SCM  05/11/22 AAROM 1lb WaTE flex, Ext, IR up back  Seated Rows Red 2x10  ER yellow 2x10 Shoulder Ext yellow 2x10 UBE L1 x 2 each  STM to UT, posterior cervical para spinales,SCM  Educated on DN, performed DN to the upper traps with good LTR's, STM to the traps and neck   PATIENT EDUCATION:  Education details: POC Person educated: Patient Education method: Explanation Education comprehension: verbalized understanding   HOME EXERCISE PROGRAM: Shrugs and retractions scapular  ASSESSMENT:  CLINICAL IMPRESSION: Patient with some increased pain in the right rhomboid and shoulder, worried about a pinched nerve, I focused on STM to the area and she had some of the same type pain so I really feel it could be mm related, she did have a lot of trigger points under the scapula and some tightness in the thoracic spine with some mobility  OBJECTIVE IMPAIRMENTS decreased activity tolerance, decreased ROM, decreased strength, increased fascial restrictions, increased muscle spasms, impaired flexibility, impaired sensation, improper body mechanics, postural dysfunction, and pain.   REHAB POTENTIAL: Good  CLINICAL DECISION MAKING: Stable/uncomplicated  EVALUATION COMPLEXITY: Low   GOALS: Goals reviewed with patient? Yes  SHORT TERM GOALS: Target date: 05/17/22   Independent with initial HEP Goal status: Met  LONG TERM GOALS:  Target date:  07/26/22  Understand proper posture and body mechanics Goal status: Progressing  2.  Decrease pain 50% Goal status: ongoing  3.  Decrease HA frequency 50% Goal status: ongoing  4.  Increase UE strength to 4+/5 Goal status: INITIAL  PLAN: PT FREQUENCY: 1-2x/week  PT DURATION: 12 weeks  PLANNED INTERVENTIONS: Therapeutic exercises, Therapeutic activity, Neuromuscular re-education, Balance training, Gait training, Patient/Family education, Self Care, Joint mobilization, Dry Needling, Electrical stimulation, Cryotherapy, Moist heat, Taping, and Manual therapy  PLAN FOR NEXT SESSION: see how the treatment did, do we need to continue this or alternate exercise and manual and or send back to MD   Sumner Boast, PT 06/20/2022, 11:39 AM

## 2022-07-07 ENCOUNTER — Ambulatory Visit: Payer: BC Managed Care – PPO | Admitting: Physical Therapy

## 2022-07-09 ENCOUNTER — Other Ambulatory Visit: Payer: Self-pay | Admitting: Medical

## 2022-07-12 ENCOUNTER — Encounter: Payer: Self-pay | Admitting: Physical Therapy

## 2022-07-12 ENCOUNTER — Ambulatory Visit: Payer: BC Managed Care – PPO | Admitting: Physical Therapy

## 2022-07-12 DIAGNOSIS — M542 Cervicalgia: Secondary | ICD-10-CM | POA: Diagnosis not present

## 2022-07-12 DIAGNOSIS — R252 Cramp and spasm: Secondary | ICD-10-CM | POA: Diagnosis not present

## 2022-07-12 NOTE — Therapy (Signed)
OUTPATIENT PHYSICAL THERAPY CERVICAL EVALUATION   Patient Name: Alexis English MRN: [id number] DOB:16-Mar-1966, 56 y.o., female Today's Date: 07/12/2022   PT End of Session - 07/12/22 1101     Visit Number 7    Date for PT Re-Evaluation 08/03/22    PT Start Time 64    PT Stop Time 6720    PT Time Calculation (min) 45 min    Activity Tolerance Patient tolerated treatment well    Behavior During Therapy [facility] for tasks assessed/performed             Past Medical History:  Diagnosis Date   Anxiety    Depression    History of kidney stones    Hypertension    Pneumonia 08/2010   Pulmonary nodule 08/2010   per CT   Ruptured disc, cervical    C4-5; cervical fusion   Smoker    Past Surgical History:  Procedure Laterality Date   ABDOMINAL HERNIA REPAIR     as a child   ANTERIOR CERVICAL DECOMP/DISCECTOMY FUSION N/A 03/06/2022   Procedure: CERVICAL THREE-FOUR, CERVICAL FIVE-SIX ANTERIOR CERVICAL DECOMPRESSION/DISCECTOMY FUSION WITH REMOVAL OF CERVICAL FOUR-FIVE PLATE;  Surgeon: Karsten Ro, DO;  Location: [place];  Service: Neurosurgery;  Laterality: N/A;   CERVICAL FUSION  2000   CESAREAN SECTION     x2   CYSTECTOMY     from back x 2   TONSILLECTOMY     Patient Active Problem List   Diagnosis Date Noted   Cervical spinal stenosis 03/03/2022   Syncope 03/02/2022   Tobacco abuse 01/22/2017   Preventative health care 03/13/2016   Cervical radiculopathy 03/26/2012   Depression with anxiety 09/05/2010   Pulmonary nodule 09/05/2010   NECK PAIN, CHRONIC 09/05/2010   HTN (hypertension) 09/05/2010    PCP: Inda Castle   REFERRING PROVIDER: [facility]  REFERRING DIAG: s/p cervical fusion C3-6  THERAPY DIAG:  Cervicalgia  Cramp and spasm  Rationale for Evaluation and Treatment Rehabilitation  ONSET DATE: 03/06/22  SUBJECTIVE:                                                                                                                                                                                                          SUBJECTIVE STATEMENT: Mid back pain is better but R shoulder is in a constant spasm    PERTINENT HISTORY:  As above  PAIN:  Are you having pain? Yes: NPRS scale: 6/10 Pain location: R arm Pain description: pressure and tightness, spasm Aggravating factors: tension Relieving factors: pushing on the spots and pain meds  help some at best pain a 3/10  PRECAUTIONS: None  WEIGHT BEARING RESTRICTIONS No  FALLS:  Has patient fallen in last 6 months? Yes. Number of falls 2  LIVING ENVIRONMENT: Lives with: lives with their family Lives in: House/apartment Stairs: Yes: Internal: 16 steps; can reach both Has following equipment at home: None  OCCUPATION: works a Teaching laboratory technician  PLOF: Independent, some [place] have less pain  OBJECTIVE:     SENSATION: Some tingling in the deltoid area and the tips of the thumbs  POSTURE: rounded shoulders and forward head  PALPATION: Very tight upper traps in the neck area, tender   CERVICAL ROM:   Active ROM A/PROM (deg) eval AROM 06/13/22  Flexion Decreased 25% WFL  Extension Decreased 50% WFL  Right lateral flexion Decreased 75% Decreased  50&  Left lateral flexion Decreased 75% Decreased 25%  Right rotation Decreased 50% WFL  Left rotation Decreased 50% WFL   (Blank rows = not tested)  UPPER EXTREMITY ROM:  WFL's UPPER EXTREMITY MMT:  4/5 with some tightness and pain in the neck and traps   TODAY'S TREATMENT:  07/12/22 STM to the right cervical, right upper trap, right rhomboid, Occipital release, scapular and thoracic mobilizations DN performed by Lead PT M. Albright  06/20/22 STM to the right cervical, right upper trap, right rhomboid, Occipital release, scapular and thoracic mobilizations MHP/IF in supine to the right upper trap and rhomboid area  06/13/22 UBE L2 x 3 min each  Shoulder ER red 2x10 Shoulder Flex 3lb 2x10 Shoulder Abd 2lb 2x10 Rows  green 2x10 Shoulder ext red 2x10 Horizontal abd  yellow 2x10  STM to UT and rhomboids DN performed by Lead PT M. Albright  05/23/22 UBE level 3 x 4 minutes Rows and lats 20# 2x10 Red tband ER 4# shrugs with upper trap and levator stretches  Cervical retraction DN to the upper traps and the sub occioitals STM tot he upper traps, the cervical pspinals and the SCM MHP/IFC to the C/T area in supine  05/17/22 NuStep L3 x6 min Rows & Lats 20lb 2x10 Shoulder Ext 5lb 2x10 Shoulder Er red 2x10 OHP yellow 2x10  STM to UT, posterior cervical para spinales,SCM  05/11/22 AAROM 1lb WaTE flex, Ext, IR up back  Seated Rows Red 2x10  ER yellow 2x10 Shoulder Ext yellow 2x10 UBE L1 x 2 each  STM to UT, posterior cervical para spinales,SCM  Educated on DN, performed DN to the upper traps with good LTR's, STM to the traps and neck   PATIENT EDUCATION:  Education details: POC Person educated: Patient Education method: Explanation Education comprehension: verbalized understanding   HOME EXERCISE PROGRAM: Shrugs and retractions scapular  ASSESSMENT:  CLINICAL IMPRESSION: Patient with some increased pain that she describes as spasms that goes down her RUE. Symptoms improved with passive L lateral cervical side bending.  Tigger points under the scapula and some tightness in the thoracic spine with some mobility remained.  Lead PT assisted in session providing Pt with DN  OBJECTIVE IMPAIRMENTS decreased activity tolerance, decreased ROM, decreased strength, increased fascial restrictions, increased muscle spasms, impaired flexibility, impaired sensation, improper body mechanics, postural dysfunction, and pain.   REHAB POTENTIAL: Good  CLINICAL DECISION MAKING: Stable/uncomplicated  EVALUATION COMPLEXITY: Low   GOALS: Goals reviewed with patient? Yes  SHORT TERM GOALS: Target date: 05/17/22   Independent with initial HEP Goal status: Met  LONG TERM GOALS: Target date:  07/26/22  Understand proper posture and body mechanics Goal status: Progressing  2.  Decrease pain 50% Goal status: ongoing  3.  Decrease HA frequency 50% Goal status: ongoing  4.  Increase UE strength to 4+/5 Goal status: INITIAL  PLAN: PT FREQUENCY: 1-2x/week  PT DURATION: 12 weeks  PLANNED INTERVENTIONS: Therapeutic exercises, Therapeutic activity, Neuromuscular re-education, Balance training, Gait training, Patient/Family education, Self Care, Joint mobilization, Dry Needling, Electrical stimulation, Cryotherapy, Moist heat, Taping, and Manual therapy  PLAN FOR NEXT SESSION: see how the treatment did, do we need to continue this or alternate exercise and manual and or send back to MD   Scot Jun, PTA 07/12/2022, 11:02 AM

## 2022-07-19 ENCOUNTER — Ambulatory Visit: Payer: BC Managed Care – PPO | Attending: Neurological Surgery | Admitting: Physical Therapy

## 2022-07-19 ENCOUNTER — Encounter: Payer: Self-pay | Admitting: Physical Therapy

## 2022-07-19 DIAGNOSIS — M542 Cervicalgia: Secondary | ICD-10-CM | POA: Diagnosis not present

## 2022-07-19 DIAGNOSIS — R252 Cramp and spasm: Secondary | ICD-10-CM | POA: Diagnosis not present

## 2022-07-19 NOTE — Therapy (Signed)
OUTPATIENT PHYSICAL THERAPY CERVICAL EVALUATION   Patient Name: Alexis English MRN: [id number] DOB:1966-02-17, 56 y.o., female Today's Date: 07/19/2022   PT End of Session - 07/19/22 1203     Visit Number 8    Number of Visits 30    Date for PT Re-Evaluation 08/03/22    Authorization Type [employer]    PT Start Time 1100    PT Stop Time 1146    PT Time Calculation (min) 46 min    Activity Tolerance Patient tolerated treatment well    Behavior During Therapy [facility] for tasks assessed/performed             Past Medical History:  Diagnosis Date   Anxiety    Depression    History of kidney stones    Hypertension    Pneumonia 08/2010   Pulmonary nodule 08/2010   per CT   Ruptured disc, cervical    C4-5; cervical fusion   Smoker    Past Surgical History:  Procedure Laterality Date   ABDOMINAL HERNIA REPAIR     as a child   ANTERIOR CERVICAL DECOMP/DISCECTOMY FUSION N/A 03/06/2022   Procedure: CERVICAL THREE-FOUR, CERVICAL FIVE-SIX ANTERIOR CERVICAL DECOMPRESSION/DISCECTOMY FUSION WITH REMOVAL OF CERVICAL FOUR-FIVE PLATE;  Surgeon: Karsten Ro, DO;  Location: [place];  Service: Neurosurgery;  Laterality: N/A;   CERVICAL FUSION  2000   CESAREAN SECTION     x2   CYSTECTOMY     from back x 2   TONSILLECTOMY     Patient Active Problem List   Diagnosis Date Noted   Cervical spinal stenosis 03/03/2022   Syncope 03/02/2022   Tobacco abuse 01/22/2017   Preventative health care 03/13/2016   Cervical radiculopathy 03/26/2012   Depression with anxiety 09/05/2010   Pulmonary nodule 09/05/2010   NECK PAIN, CHRONIC 09/05/2010   HTN (hypertension) 09/05/2010    PCP: Inda Castle   REFERRING PROVIDER: [facility]  REFERRING DIAG: s/p cervical fusion C3-6  THERAPY DIAG:  Cervicalgia  Cramp and spasm  Rationale for Evaluation and Treatment Rehabilitation  ONSET DATE: 03/06/22  SUBJECTIVE:                                                                                                                                                                                                          SUBJECTIVE STATEMENT: Reports that she thinks that some things are better but still pain in the arm and the rhomboid  PERTINENT HISTORY:  As above  PAIN:  Are you having pain? Yes: NPRS scale: 6/10 Pain location: R arm Pain description:  pressure and tightness, spasm Aggravating factors: tension Relieving factors: pushing on the spots and pain meds help some at best pain a 3/10  PRECAUTIONS: None  WEIGHT BEARING RESTRICTIONS No  FALLS:  Has patient fallen in last 6 months? Yes. Number of falls 2  LIVING ENVIRONMENT: Lives with: lives with their family Lives in: House/apartment Stairs: Yes: Internal: 16 steps; can reach both Has following equipment at home: None  OCCUPATION: works a Teaching laboratory technician  PLOF: Independent, some [place] have less pain  OBJECTIVE:     SENSATION: Some tingling in the deltoid area and the tips of the thumbs  POSTURE: rounded shoulders and forward head  PALPATION: Very tight upper traps in the neck area, tender   CERVICAL ROM:   Active ROM A/PROM (deg) eval AROM 06/13/22  Flexion Decreased 25% WFL  Extension Decreased 50% WFL  Right lateral flexion Decreased 75% Decreased  50&  Left lateral flexion Decreased 75% Decreased 25%  Right rotation Decreased 50% WFL  Left rotation Decreased 50% WFL   (Blank rows = not tested)  UPPER EXTREMITY ROM:  WFL's UPPER EXTREMITY MMT:  4/5 with some tightness and pain in the neck and traps   TODAY'S TREATMENT:  07/19/22 DN to the upper trap, occipital area and the rhomboid on the right STM, to the above Occipital release Thoracic mobilization MHP/IFC to above  07/12/22 STM to the right cervical, right upper trap, right rhomboid, Occipital release, scapular and thoracic mobilizations DN performed by Lead PT M. Albright  06/20/22 STM to the right cervical, right upper trap,  right rhomboid, Occipital release, scapular and thoracic mobilizations MHP/IF in supine to the right upper trap and rhomboid area  06/13/22 UBE L2 x 3 min each  Shoulder ER red 2x10 Shoulder Flex 3lb 2x10 Shoulder Abd 2lb 2x10 Rows green 2x10 Shoulder ext red 2x10 Horizontal abd  yellow 2x10  STM to UT and rhomboids DN performed by Lead PT M. Albright  05/23/22 UBE level 3 x 4 minutes Rows and lats 20# 2x10 Red tband ER 4# shrugs with upper trap and levator stretches  Cervical retraction DN to the upper traps and the sub occioitals STM tot he upper traps, the cervical pspinals and the SCM MHP/IFC to the C/T area in supine  05/17/22 NuStep L3 x6 min Rows & Lats 20lb 2x10 Shoulder Ext 5lb 2x10 Shoulder Er red 2x10 OHP yellow 2x10  STM to UT, posterior cervical para spinales,SCM  05/11/22 AAROM 1lb WaTE flex, Ext, IR up back  Seated Rows Red 2x10  ER yellow 2x10 Shoulder Ext yellow 2x10 UBE L1 x 2 each  STM to UT, posterior cervical para spinales,SCM  Educated on DN, performed DN to the upper traps with good LTR's, STM to the traps and neck   PATIENT EDUCATION:  Education details: POC Person educated: Patient Education method: Explanation Education comprehension: verbalized understanding   HOME EXERCISE PROGRAM: Shrugs and retractions scapular  ASSESSMENT:  CLINICAL IMPRESSION: Patient continues to have the right arm symptoms and the right rhomboid spasms, she feels like some things are getting better but others are about the same, I tried more manual therapy again today to see if anything would help with her symptoms OBJECTIVE IMPAIRMENTS decreased activity tolerance, decreased ROM, decreased strength, increased fascial restrictions, increased muscle spasms, impaired flexibility, impaired sensation, improper body mechanics, postural dysfunction, and pain.   REHAB POTENTIAL: Good  CLINICAL DECISION MAKING: Stable/uncomplicated  EVALUATION COMPLEXITY:  Low   GOALS: Goals reviewed with patient? Yes  SHORT TERM GOALS: Target date: 05/17/22   Independent with initial HEP Goal status: Met  LONG TERM GOALS: Target date: 07/26/22  Understand proper posture and body mechanics Goal status: Progressing  2.  Decrease pain 50% Goal status: ongoing  3.  Decrease HA frequency 50% Goal status: ongoing  4.  Increase UE strength to 4+/5 Goal status: INITIAL  PLAN: PT FREQUENCY: 1-2x/week  PT DURATION: 12 weeks  PLANNED INTERVENTIONS: Therapeutic exercises, Therapeutic activity, Neuromuscular re-education, Balance training, Gait training, Patient/Family education, Self Care, Joint mobilization, Dry Needling, Electrical stimulation, Cryotherapy, Moist heat, Taping, and Manual therapy  PLAN FOR NEXT SESSION: continue to monitor  Sumner Boast, PT 07/19/2022, 12:04 PM

## 2022-07-26 ENCOUNTER — Encounter: Payer: Self-pay | Admitting: Physical Therapy

## 2022-07-26 ENCOUNTER — Ambulatory Visit: Payer: BC Managed Care – PPO | Admitting: Physical Therapy

## 2022-07-26 DIAGNOSIS — R252 Cramp and spasm: Secondary | ICD-10-CM | POA: Diagnosis not present

## 2022-07-26 DIAGNOSIS — M542 Cervicalgia: Secondary | ICD-10-CM | POA: Diagnosis not present

## 2022-07-26 NOTE — Therapy (Signed)
OUTPATIENT PHYSICAL THERAPY CERVICAL EVALUATION   Patient Name: Alexis English MRN: [id number] DOB:May 25, 1966, 56 y.o., female Today's Date: 07/26/2022   PT End of Session - 07/26/22 0929     Visit Number 9    Date for PT Re-Evaluation 08/03/22    PT Start Time 0930    PT Stop Time 4818    PT Time Calculation (min) 45 min    Activity Tolerance Patient tolerated treatment well    Behavior During Therapy [facility] for tasks assessed/performed             Past Medical History:  Diagnosis Date   Anxiety    Depression    History of kidney stones    Hypertension    Pneumonia 08/2010   Pulmonary nodule 08/2010   per CT   Ruptured disc, cervical    C4-5; cervical fusion   Smoker    Past Surgical History:  Procedure Laterality Date   ABDOMINAL HERNIA REPAIR     as a child   ANTERIOR CERVICAL DECOMP/DISCECTOMY FUSION N/A 03/06/2022   Procedure: CERVICAL THREE-FOUR, CERVICAL FIVE-SIX ANTERIOR CERVICAL DECOMPRESSION/DISCECTOMY FUSION WITH REMOVAL OF CERVICAL FOUR-FIVE PLATE;  Surgeon: Karsten Ro, DO;  Location: [place];  Service: Neurosurgery;  Laterality: N/A;   CERVICAL FUSION  2000   CESAREAN SECTION     x2   CYSTECTOMY     from back x 2   TONSILLECTOMY     Patient Active Problem List   Diagnosis Date Noted   Cervical spinal stenosis 03/03/2022   Syncope 03/02/2022   Tobacco abuse 01/22/2017   Preventative health care 03/13/2016   Cervical radiculopathy 03/26/2012   Depression with anxiety 09/05/2010   Pulmonary nodule 09/05/2010   NECK PAIN, CHRONIC 09/05/2010   HTN (hypertension) 09/05/2010    PCP: Inda Castle   REFERRING PROVIDER: Dawley  REFERRING DIAG: s/p cervical fusion C3-6  THERAPY DIAG:  Cervicalgia  Cramp and spasm  Rationale for Evaluation and Treatment Rehabilitation  ONSET DATE: 03/06/22  SUBJECTIVE:                                                                                                                                                                                                          SUBJECTIVE STATEMENT: "Still in pain" Constantly in pain  PERTINENT HISTORY:  As above  PAIN:  Are you having pain? Yes: NPRS scale: 7/10 Pain location: upper traps and shoulders Pain description: pressure and tightness, spasm Aggravating factors: tension Relieving factors: pushing on the spots and pain meds help some at best pain a 3/10  PRECAUTIONS: None  WEIGHT BEARING RESTRICTIONS No  FALLS:  Has patient fallen in last 6 months? Yes. Number of falls 2  LIVING ENVIRONMENT: Lives with: lives with their family Lives in: House/apartment Stairs: Yes: Internal: 16 steps; can reach both Has following equipment at home: None  OCCUPATION: works a Teaching laboratory technician  PLOF: Independent, some [place] have less pain  OBJECTIVE:     SENSATION: Some tingling in the deltoid area and the tips of the thumbs  POSTURE: rounded shoulders and forward head  PALPATION: Very tight upper traps in the neck area, tender   CERVICAL ROM:   Active ROM A/PROM (deg) eval AROM 06/13/22  Flexion Decreased 25% WFL  Extension Decreased 50% WFL  Right lateral flexion Decreased 75% Decreased  50&  Left lateral flexion Decreased 75% Decreased 25%  Right rotation Decreased 50% WFL  Left rotation Decreased 50% WFL   (Blank rows = not tested)  UPPER EXTREMITY ROM:  WFL's UPPER EXTREMITY MMT:  4/5 with some tightness and pain in the neck and traps   TODAY'S TREATMENT:  07/26/22 STM, to the UT, cervical paraspinals, ghomboids Occipital release Chin tucks into pillow DN to the upper trap, occipital area and the rhomboid on the right performed by M.Albright  07/19/22 DN to the upper trap, occipital area and the rhomboid on the right STM, to the above Occipital release Thoracic mobilization MHP/IFC to above  07/12/22 STM to the right cervical, right upper trap, right rhomboid, Occipital release, scapular and thoracic  mobilizations DN performed by Lead PT M. Albright  06/20/22 STM to the right cervical, right upper trap, right rhomboid, Occipital release, scapular and thoracic mobilizations MHP/IF in supine to the right upper trap and rhomboid area  06/13/22 UBE L2 x 3 min each  Shoulder ER red 2x10 Shoulder Flex 3lb 2x10 Shoulder Abd 2lb 2x10 Rows green 2x10 Shoulder ext red 2x10 Horizontal abd  yellow 2x10  STM to UT and rhomboids DN performed by Lead PT M. Albright  05/23/22 UBE level 3 x 4 minutes Rows and lats 20# 2x10 Red tband ER 4# shrugs with upper trap and levator stretches  Cervical retraction DN to the upper traps and the sub occioitals STM tot he upper traps, the cervical pspinals and the SCM MHP/IFC to the C/T area in supine  05/17/22 NuStep L3 x6 min Rows & Lats 20lb 2x10 Shoulder Ext 5lb 2x10 Shoulder Er red 2x10 OHP yellow 2x10  STM to UT, posterior cervical para spinales,SCM  05/11/22 AAROM 1lb WaTE flex, Ext, IR up back  Seated Rows Red 2x10  ER yellow 2x10 Shoulder Ext yellow 2x10 UBE L1 x 2 each  STM to UT, posterior cervical para spinales,SCM  Educated on DN, performed DN to the upper traps with good LTR's, STM to the traps and neck   PATIENT EDUCATION:  Education details: POC Person educated: Patient Education method: Explanation Education comprehension: verbalized understanding   HOME EXERCISE PROGRAM: Shrugs and retractions scapular  ASSESSMENT:  CLINICAL IMPRESSION: Patient continues to have the right arm symptoms and pain in the upper traps and neck.  she feels like she has some issue with the disk below her fusion, I continues with more manual therapy again today to see if anything would help with her symptoms OBJECTIVE IMPAIRMENTS decreased activity tolerance, decreased ROM, decreased strength, increased fascial restrictions, increased muscle spasms, impaired flexibility, impaired sensation, improper body mechanics, postural dysfunction, and pain.    REHAB POTENTIAL: Good  CLINICAL DECISION MAKING: Stable/uncomplicated  EVALUATION  COMPLEXITY: Low   GOALS: Goals reviewed with patient? Yes  SHORT TERM GOALS: Target date: 05/17/22   Independent with initial HEP Goal status: Met  LONG TERM GOALS: Target date: 07/26/22  Understand proper posture and body mechanics Goal status: Progressing  2.  Decrease pain 50% Goal status: ongoing  3.  Decrease HA frequency 50% Goal status: ongoing  4.  Increase UE strength to 4+/5 Goal status: INITIAL  PLAN: PT FREQUENCY: 1-2x/week  PT DURATION: 12 weeks  PLANNED INTERVENTIONS: Therapeutic exercises, Therapeutic activity, Neuromuscular re-education, Balance training, Gait training, Patient/Family education, Self Care, Joint mobilization, Dry Needling, Electrical stimulation, Cryotherapy, Moist heat, Taping, and Manual therapy  PLAN FOR NEXT SESSION: continue to monitor  Scot Jun, PTA 07/26/2022, 9:30 AM

## 2022-08-02 DIAGNOSIS — M4802 Spinal stenosis, cervical region: Secondary | ICD-10-CM | POA: Diagnosis not present

## 2022-08-02 DIAGNOSIS — M4722 Other spondylosis with radiculopathy, cervical region: Secondary | ICD-10-CM | POA: Diagnosis not present

## 2022-08-02 DIAGNOSIS — M542 Cervicalgia: Secondary | ICD-10-CM | POA: Diagnosis not present

## 2022-08-15 DIAGNOSIS — R202 Paresthesia of skin: Secondary | ICD-10-CM | POA: Diagnosis not present

## 2022-08-15 DIAGNOSIS — R200 Anesthesia of skin: Secondary | ICD-10-CM | POA: Diagnosis not present

## 2022-08-15 DIAGNOSIS — M4722 Other spondylosis with radiculopathy, cervical region: Secondary | ICD-10-CM | POA: Diagnosis not present

## 2022-08-30 DIAGNOSIS — M4802 Spinal stenosis, cervical region: Secondary | ICD-10-CM | POA: Diagnosis not present

## 2022-08-30 DIAGNOSIS — G959 Disease of spinal cord, unspecified: Secondary | ICD-10-CM | POA: Diagnosis not present

## 2022-10-03 ENCOUNTER — Other Ambulatory Visit: Payer: Self-pay | Admitting: Medical

## 2022-11-01 ENCOUNTER — Other Ambulatory Visit: Payer: Self-pay | Admitting: Family

## 2022-11-20 ENCOUNTER — Other Ambulatory Visit: Payer: Self-pay | Admitting: Family

## 2022-11-20 MED ORDER — AMLODIPINE BESYLATE 10 MG PO TABS: 10.0000 mg | ORAL_TABLET | Freq: Every day | ORAL | 0 refills | Status: DC

## 2022-11-20 NOTE — Addendum Note (Signed)
Addended byDamita Dunnings D on: 11/20/2022 10:01 AM   Modules accepted: Orders

## 2022-11-26 ENCOUNTER — Other Ambulatory Visit: Payer: Self-pay | Admitting: Family

## 2022-12-04 ENCOUNTER — Ambulatory Visit
Admission: RE | Admit: 2022-12-04 | Discharge: 2022-12-04 | Disposition: A | Discharge status: Home / Self Care | Payer: BC Managed Care – PPO | Source: Ambulatory Visit | Attending: Family | Admitting: Family

## 2022-12-04 DIAGNOSIS — N6310 Unspecified lump in the right breast, unspecified quadrant: Secondary | ICD-10-CM

## 2022-12-04 DIAGNOSIS — N6312 Unspecified lump in the right breast, upper inner quadrant: Secondary | ICD-10-CM | POA: Diagnosis not present

## 2022-12-04 DIAGNOSIS — R922 Inconclusive mammogram: Secondary | ICD-10-CM | POA: Diagnosis not present

## 2022-12-04 DIAGNOSIS — N6311 Unspecified lump in the right breast, upper outer quadrant: Secondary | ICD-10-CM | POA: Diagnosis not present

## 2022-12-26 ENCOUNTER — Other Ambulatory Visit: Payer: Self-pay | Admitting: Family

## 2022-12-26 NOTE — Telephone Encounter (Signed)
Appt tomorrow.

## 2022-12-27 ENCOUNTER — Ambulatory Visit (INDEPENDENT_AMBULATORY_CARE_PROVIDER_SITE_OTHER): Payer: BC Managed Care – PPO | Admitting: Family

## 2022-12-27 VITALS — BP 127/85 | HR 83 | Temp 98.5°F | Resp 16 | Wt 117.0 lb

## 2022-12-27 DIAGNOSIS — F418 Other specified anxiety disorders: Secondary | ICD-10-CM

## 2022-12-27 DIAGNOSIS — I10 Essential (primary) hypertension: Secondary | ICD-10-CM

## 2022-12-27 DIAGNOSIS — M542 Cervicalgia: Secondary | ICD-10-CM | POA: Diagnosis not present

## 2022-12-27 DIAGNOSIS — Z720 Tobacco use: Secondary | ICD-10-CM

## 2022-12-27 MED ORDER — AMLODIPINE BESYLATE 10 MG PO TABS: 10.0000 mg | ORAL_TABLET | Freq: Every day | ORAL | 1 refills | Status: DC

## 2022-12-27 MED ORDER — VENLAFAXINE HCL ER 75 MG PO CP24: 75.0000 mg | ORAL_CAPSULE | Freq: Every day | ORAL | 1 refills | Status: DC

## 2022-12-27 NOTE — Progress Notes (Signed)
Subjective:   By signing my name below, I, Barrett ShellKennedy C Lynn, attest that this documentation has been prepared under the direction and in the presence of Sandford Craze'Sullivan, Delailah Spieth, NP. 12/27/2022   Patient ID: Alexis English, female    DOB: [birth year], 57 y.o.   MRN: [id number]  Chief Complaint  Patient presents with   Hypertension    Here for follow up    HPI Patient is in today for an office visit.   Blood pressure: Her blood pressure is slightly elevated during this visit. She is taking 10 mg amlodipine daily PO to manage it.  BP Readings from Last 3 Encounters:  12/27/22 127/85  03/09/22 113/77  03/02/22 (!) 170/98    Pulse Readings from Last 3 Encounters:  12/27/22 83  03/09/22 63  03/02/22 95     Herniated disc: She currently has a herniated disc in her left shoulder and previously had a C4-5 cervical fusion. The herniated disc hurts most when lying down and the pain radiates from the shoulder down the left arm. She is planning on receiving injections to ease the pain of the herniated disc.   Pap smear: She is interested in receiving a pap smear.   Effexor: Her mood has been stable while taking 75 mg Effexor daily PO.    Social History: She quit smoking tobacco around 10 months ago.   Past Surgical History:  Procedure Laterality Date   ABDOMINAL HERNIA REPAIR     as a child   ANTERIOR CERVICAL DECOMP/DISCECTOMY FUSION N/A 03/06/2022   Procedure: CERVICAL THREE-FOUR, CERVICAL FIVE-SIX ANTERIOR CERVICAL DECOMPRESSION/DISCECTOMY FUSION WITH REMOVAL OF CERVICAL FOUR-FIVE PLATE;  Surgeon: Bethann Gooawley, Troy C, DO;  Location: [facility];  Service: Neurosurgery;  Laterality: N/A;   CERVICAL FUSION  2000   CESAREAN SECTION     x2   CYSTECTOMY     from back x 2   TONSILLECTOMY      Family History  Problem Relation Age of Onset   Heart attack Mother    Arthritis Mother    Anxiety disorder Mother    Heart attack Father    Colon cancer Father [id number]       father having chemo now for  colon cancer     Social History   Socioeconomic History   Marital status: Divorced    Spouse name: Not on file   Number of children: 2   Years of education: Not on file   Highest education level: Not on file  Occupational History   Occupation: OWNER    Employer: [employer]    Comment: wedding Education officer, museummagazine publisher  Tobacco Use   Smoking status: Every Day    Packs/day: 0.50    Years: 26.00    Additional pack years: 0.00    Total pack years: 13.00    Types: Cigarettes   Smokeless tobacco: Never   Tobacco comments:    trying to stop smoking  Vaping Use   Vaping Use: Never used  Substance and Sexual Activity   Alcohol use: Yes    Alcohol/week: 14.0 standard drinks of alcohol    Types: 14 Glasses of wine per week   Drug use: Not Currently   Sexual activity: Not on file  Other Topics Concern   Not on file  Social History Narrative   Regular exercise:  No   Owns a local wedding industry.     Social Determinants of Health   Financial Resource Strain: Not on file  Food Insecurity: Not on  file  Transportation Needs: Not on file  Physical Activity: Not on file  Stress: Not on file  Social Connections: Not on file  Intimate Partner Violence: Not on file    Outpatient Medications Prior to Visit  Medication Sig Dispense Refill   acetaminophen (TYLENOL) 325 MG tablet Take 2 tablets (650 mg total) by mouth every 4 (four) hours as needed for mild pain or moderate pain (or Fever >/= 101).     amLODipine (NORVASC) 10 MG tablet TAKE 1 TABLET BY MOUTH EVERY DAY 90 tablet 0   venlafaxine XR (EFFEXOR-XR) 75 MG 24 hr capsule TAKE 1 CAPSULE BY MOUTH DAILY WITH BREAKFAST. 30 capsule 0   cyclobenzaprine (FLEXERIL) 5 MG tablet Take 1 tablet (5 mg total) by mouth 3 (three) times daily as needed for muscle spasms. 30 tablet 0   dexamethasone (DECADRON) 4 MG tablet Take 1 tablet three times a day for 2 days, then 1 tablet twice daily for 3 days, then 1 tablet daily for 3 days, then  1/2 (half) tablet daily for 4 days 17 tablet 0   gabapentin (NEURONTIN) 300 MG capsule Take 2 capsules (600 mg total) by mouth 3 (three) times daily. 90 capsule 2   Multiple Vitamin (MULTIVITAMIN WITH MINERALS) TABS tablet Take 1 tablet by mouth daily.     oxyCODONE (OXY IR/ROXICODONE) 5 MG immediate release tablet Take 1 tablet (5 mg total) by mouth every 4 (four) hours as needed for severe pain ((score 7 to 10)). 42 tablet 0   senna-docusate (SENOKOT-S) 8.6-50 MG tablet Take 1 tablet by mouth 2 (two) times daily as needed for mild constipation.     No facility-administered medications prior to visit.    Allergies  Allergen Reactions   Hydrocodone Itching   Bupropion Hcl     REACTION: hives   Buspar [Buspirone] Other (See Comments)    Nausea, dizziness, weakness, muscle cramps   Chantix [Varenicline]     dizziness   Propoxyphene Hives   Propoxyphene N-Acetaminophen     REACTION: hives   Zoloft [Sertraline]     hives    ROS See HPI    Objective:    Physical Exam Constitutional:      General: She is not in acute distress.    Appearance: Normal appearance. She is well-developed.  HENT:     Head: Normocephalic and atraumatic.     Right Ear: External ear normal.     Left Ear: External ear normal.  Eyes:     General: No scleral icterus.    Extraocular Movements: Extraocular movements intact.     Pupils: Pupils are equal, round, and reactive to light.  Neck:     Thyroid: No thyromegaly.  Cardiovascular:     Rate and Rhythm: Normal rate and regular rhythm.     Heart sounds: Normal heart sounds. No murmur heard.    No gallop.     Comments: Manual bp recheck 135/86 Pulmonary:     Effort: Pulmonary effort is normal. No respiratory distress.     Breath sounds: Normal breath sounds. No wheezing or rales.  Musculoskeletal:     Cervical back: Neck supple.  Skin:    General: Skin is warm and dry.  Neurological:     Mental Status: She is alert and oriented to person, place,  and time.  Psychiatric:        Mood and Affect: Mood normal.        Behavior: Behavior normal.  Thought Content: Thought content normal.        Judgment: Judgment normal.     BP 127/85 (BP Location: Left Arm, Patient Position: Sitting, Cuff Size: Small)   Pulse 83   Temp 98.5 F (36.9 C) (Oral)   Resp 16   Wt 117 lb (53.1 kg)   LMP 03/21/2012   SpO2 100%   BMI 20.73 kg/m  Wt Readings from Last 3 Encounters:  12/27/22 117 lb (53.1 kg)  03/08/22 121 lb 11.1 oz (55.2 kg)  10/10/21 120 lb (54.4 kg)       Assessment & Plan:  Depression with anxiety Assessment & Plan: Stable on effexor despite multiple recent stressors.  Continue same.    NECK PAIN, CHRONIC Assessment & Plan: This is being addressed by her neurosurgeon and she states she will be receiving an ESI for current right sided cervical radiculopathy.    Primary hypertension Assessment & Plan: BP stable. Continue amlodipine 10mg .  She had a syncopal episode over the summer which was followed by a hospitalization.  They discontinued her hctz at that time, presumably due to concern about dehydration/bp overtreatment. Since BP is stable at this time without hctz, I advised pt to remain off of hctz.    Tobacco abuse Assessment & Plan: She has nearly quit smoking since her hospitalization. Had a few cigarettes with friends.  We dicussed how important quitting is to her overall health and surgical healing.    Other orders -     Venlafaxine HCl ER; Take 1 capsule (75 mg total) by mouth daily with breakfast.  Dispense: 90 capsule; Refill: 1 -     amLODIPine Besylate; Take 1 tablet (10 mg total) by mouth daily.  Dispense: 90 tablet; Refill: 1    I, Lemont Fillers, NP, personally preformed the services described in this documentation.  All medical record entries made by the scribe were at my direction and in my presence.  I have reviewed the chart and discharge instructions (if applicable) and agree that the  record reflects my personal performance and is accurate and complete. 12/27/2022  Lemont Fillers, NP   Mercer Pod as a scribe for Lemont Fillers, NP.,have documented all relevant documentation on the behalf of Lemont Fillers, NP,as directed by  Lemont Fillers, NP while in the presence of Lemont Fillers, NP.

## 2022-12-27 NOTE — Assessment & Plan Note (Signed)
BP stable. Continue amlodipine 10mg .  She had a syncopal episode over the summer which was followed by a hospitalization.  They discontinued her hctz at that time, presumably due to concern about dehydration/bp overtreatment. Since BP is stable at this time without hctz, I advised pt to remain off of hctz.

## 2022-12-27 NOTE — Assessment & Plan Note (Signed)
She has nearly quit smoking since her hospitalization. Had a few cigarettes with friends.  We dicussed how important quitting is to her overall health and surgical healing.

## 2022-12-27 NOTE — Assessment & Plan Note (Signed)
Stable on effexor despite multiple recent stressors.  Continue same.

## 2022-12-27 NOTE — Assessment & Plan Note (Signed)
This is being addressed by her neurosurgeon and she states she will be receiving an ESI for current right sided cervical radiculopathy.

## 2023-04-14 ENCOUNTER — Other Ambulatory Visit: Payer: Self-pay | Admitting: Family

## 2023-08-22 ENCOUNTER — Other Ambulatory Visit: Payer: Self-pay | Admitting: Family

## 2023-08-22 NOTE — Telephone Encounter (Signed)
Lvm2 sched  

## 2023-08-22 NOTE — Telephone Encounter (Signed)
Please contact pt to schedule follow up visit.

## 2023-09-19 ENCOUNTER — Other Ambulatory Visit: Payer: Self-pay | Admitting: Family

## 2023-10-16 ENCOUNTER — Other Ambulatory Visit: Payer: Self-pay | Admitting: Family

## 2023-10-18 ENCOUNTER — Other Ambulatory Visit: Payer: Self-pay | Admitting: Family

## 2023-10-18 MED ORDER — AMLODIPINE BESYLATE 10 MG PO TABS: 10.0000 mg | ORAL_TABLET | Freq: Every day | ORAL | 0 refills | Status: DC

## 2023-11-06 ENCOUNTER — Encounter: Payer: Self-pay | Admitting: Family

## 2023-11-17 ENCOUNTER — Other Ambulatory Visit: Payer: Self-pay | Admitting: Family

## 2023-11-18 NOTE — Telephone Encounter (Signed)
Please contact pt to schedule follow-up apt.

## 2023-11-22 DIAGNOSIS — M50123 Cervical disc disorder at C6-C7 level with radiculopathy: Secondary | ICD-10-CM | POA: Diagnosis not present

## 2023-11-22 DIAGNOSIS — M4322 Fusion of spine, cervical region: Secondary | ICD-10-CM | POA: Diagnosis not present

## 2023-11-23 ENCOUNTER — Other Ambulatory Visit: Payer: Self-pay | Admitting: Orthopedic Surgery

## 2023-11-23 DIAGNOSIS — M4322 Fusion of spine, cervical region: Secondary | ICD-10-CM

## 2023-11-23 DIAGNOSIS — M50123 Cervical disc disorder at C6-C7 level with radiculopathy: Secondary | ICD-10-CM

## 2023-11-23 NOTE — Telephone Encounter (Signed)
 Lvm 2 sch.

## 2023-12-09 ENCOUNTER — Ambulatory Visit
Admission: RE | Admit: 2023-12-09 | Discharge: 2023-12-09 | Disposition: A | Discharge status: Home / Self Care | Source: Ambulatory Visit | Attending: Orthopedic Surgery | Admitting: Orthopedic Surgery

## 2023-12-09 DIAGNOSIS — M542 Cervicalgia: Secondary | ICD-10-CM | POA: Diagnosis not present

## 2023-12-09 DIAGNOSIS — M50123 Cervical disc disorder at C6-C7 level with radiculopathy: Secondary | ICD-10-CM

## 2023-12-09 DIAGNOSIS — Z981 Arthrodesis status: Secondary | ICD-10-CM | POA: Diagnosis not present

## 2023-12-09 DIAGNOSIS — M4322 Fusion of spine, cervical region: Secondary | ICD-10-CM

## 2023-12-15 ENCOUNTER — Other Ambulatory Visit: Payer: Self-pay | Admitting: Family

## 2023-12-17 NOTE — Telephone Encounter (Signed)
Please contact pt to schedule office visit. 

## 2023-12-18 ENCOUNTER — Encounter: Payer: Self-pay | Admitting: Family

## 2023-12-20 DIAGNOSIS — M4322 Fusion of spine, cervical region: Secondary | ICD-10-CM | POA: Diagnosis not present

## 2023-12-20 DIAGNOSIS — M50123 Cervical disc disorder at C6-C7 level with radiculopathy: Secondary | ICD-10-CM | POA: Diagnosis not present

## 2023-12-24 ENCOUNTER — Other Ambulatory Visit: Payer: Self-pay | Admitting: Internal Medicine

## 2023-12-24 DIAGNOSIS — M50123 Cervical disc disorder at C6-C7 level with radiculopathy: Secondary | ICD-10-CM

## 2023-12-24 DIAGNOSIS — M4322 Fusion of spine, cervical region: Secondary | ICD-10-CM

## 2023-12-28 ENCOUNTER — Encounter: Payer: Self-pay | Admitting: Internal Medicine

## 2024-01-01 ENCOUNTER — Other Ambulatory Visit

## 2024-01-03 ENCOUNTER — Encounter: Payer: Self-pay | Admitting: Internal Medicine

## 2024-01-07 ENCOUNTER — Other Ambulatory Visit: Payer: Self-pay | Admitting: Family

## 2024-01-16 ENCOUNTER — Encounter: Payer: Self-pay | Admitting: Internal Medicine

## 2024-01-22 ENCOUNTER — Ambulatory Visit
Admission: RE | Admit: 2024-01-22 | Discharge: 2024-01-22 | Disposition: A | Discharge status: Home / Self Care | Source: Ambulatory Visit | Attending: Internal Medicine | Admitting: Internal Medicine

## 2024-01-22 DIAGNOSIS — M4322 Fusion of spine, cervical region: Secondary | ICD-10-CM

## 2024-01-22 DIAGNOSIS — M542 Cervicalgia: Secondary | ICD-10-CM | POA: Diagnosis not present

## 2024-01-22 DIAGNOSIS — M50123 Cervical disc disorder at C6-C7 level with radiculopathy: Secondary | ICD-10-CM

## 2024-01-22 DIAGNOSIS — Z981 Arthrodesis status: Secondary | ICD-10-CM | POA: Diagnosis not present

## 2024-01-23 ENCOUNTER — Other Ambulatory Visit (HOSPITAL_COMMUNITY)
Admission: RE | Admit: 2024-01-23 | Discharge: 2024-01-23 | Disposition: A | Discharge status: Home / Self Care | Source: Ambulatory Visit | Attending: Family | Admitting: Family

## 2024-01-23 ENCOUNTER — Ambulatory Visit: Admitting: Family

## 2024-01-23 VITALS — BP 130/76 | HR 76 | Temp 98.6°F | Resp 16 | Ht 63.0 in | Wt 116.0 lb

## 2024-01-23 DIAGNOSIS — F418 Other specified anxiety disorders: Secondary | ICD-10-CM

## 2024-01-23 DIAGNOSIS — E785 Hyperlipidemia, unspecified: Secondary | ICD-10-CM | POA: Diagnosis not present

## 2024-01-23 DIAGNOSIS — R739 Hyperglycemia, unspecified: Secondary | ICD-10-CM

## 2024-01-23 DIAGNOSIS — N6310 Unspecified lump in the right breast, unspecified quadrant: Secondary | ICD-10-CM

## 2024-01-23 DIAGNOSIS — Z01419 Encounter for gynecological examination (general) (routine) without abnormal findings: Secondary | ICD-10-CM | POA: Diagnosis not present

## 2024-01-23 DIAGNOSIS — Z0000 Encounter for general adult medical examination without abnormal findings: Secondary | ICD-10-CM

## 2024-01-23 DIAGNOSIS — I10 Essential (primary) hypertension: Secondary | ICD-10-CM | POA: Diagnosis not present

## 2024-01-23 DIAGNOSIS — Z720 Tobacco use: Secondary | ICD-10-CM

## 2024-01-23 LAB — COMPREHENSIVE METABOLIC PANEL WITH GFR
ALT: 20 U/L (ref 0–35)
AST: 25 U/L (ref 0–37)
Albumin: 4.9 g/dL (ref 3.5–5.2)
Alkaline Phosphatase: 27 U/L — ABNORMAL LOW (ref 39–117)
BUN: 10 mg/dL (ref 6–23)
CO2: 28 meq/L (ref 19–32)
Calcium: 9.5 mg/dL (ref 8.4–10.5)
Chloride: 100 meq/L (ref 96–112)
Creatinine, Ser: 0.65 mg/dL (ref 0.40–1.20)
GFR: 98.69 mL/min (ref >60.00)
Glucose, Bld: 80 mg/dL (ref 70–99)
Potassium: 4.2 meq/L (ref 3.5–5.1)
Sodium: 138 meq/L (ref 135–145)
Total Bilirubin: 0.5 mg/dL (ref 0.2–1.2)
Total Protein: 6.8 g/dL (ref 6.0–8.3)

## 2024-01-23 LAB — LIPID PANEL
Cholesterol: 294 mg/dL — ABNORMAL HIGH (ref 0–200)
HDL: 108.2 mg/dL (ref >39.00)
LDL Cholesterol: 175 mg/dL — ABNORMAL HIGH (ref 0–99)
NonHDL: 186.17
Total CHOL/HDL Ratio: 3
Triglycerides: 58 mg/dL (ref 0.0–149.0)
VLDL: 11.6 mg/dL (ref 0.0–40.0)

## 2024-01-23 LAB — HEMOGLOBIN A1C: Hgb A1c MFr Bld: 5.5 % (ref 4.6–6.5)

## 2024-01-23 NOTE — Progress Notes (Signed)
 Subjective:     Patient ID: Alexis English, female    DOB: May 05, 1966, 58 y.o.   MRN: [id number]  Chief Complaint  Patient presents with   Hypertension    Here for follow up   Depression    "Doing well on medication"    Hypertension Associated symptoms include neck pain. Pertinent negatives include no blurred vision or headaches.  Depression        Associated symptoms include no headaches.   Discussed the use of AI scribe software for clinical note transcription with the patient, who gave verbal consent to proceed.  History of Present Illness   Alexis English is a 58 year old female with a history of cervical spine issues who presents for follow up and physical today. We haven't seen her in some time because she has been dealing with her cervical spine disease. She underwent a cervical triple fusion surgery in June 2023 and recently developed a herniated disc at C6-C7. She reports that an MRI two months ago and a CT scan yesterday confirmed this. She experiences significant neck pain, especially at night, causing her arms to experience paresthesia, which disrupts her sleep.   She has tried gabapentin  but experienced adverse effects. Advil provides the most relief. She expresses frustration with the numerous medications she has tried.  She takes amlodipine  10 mg for hypertension, which causes ankle edema. She is on Effexor  for mood, which she tolerates well. She smokes and is attempting to reduce her nicotine  intake.  She experiences dizziness with her progressive glasses, which do not align well with her vision needs, especially with her neck issues. She reports occasional back pain and increased sneezing due to pollen. No swelling in legs, cough, cold symptoms, digestive concerns, or urinary issues.  Exercise is limited due to neck pain. Diet is generally healthy. She is up to date on dental and vision exam. Mammo/colo/pap due. Declines to schedule colo at this time - wants  to get through her neck surgery first.    Health Maintenance Due  Topic Date Due   Hepatitis C Screening  Never done   Pneumococcal Vaccine 71-56 Years old (1 of 2 - PCV) Never done   Cervical Cancer Screening (HPV/Pap Cotest)  02/17/2015   Colonoscopy  02/20/2021   COVID-19 Vaccine (3 - 2024-25 season) 05/20/2023   MAMMOGRAM  12/04/2023    Past Medical History:  Diagnosis Date   Anxiety    Depression    History of kidney stones    Hypertension    Pneumonia 08/2010   Pulmonary nodule 08/2010   per CT   Ruptured disc, cervical    C4-5; cervical fusion   Smoker     Past Surgical History:  Procedure Laterality Date   ABDOMINAL HERNIA REPAIR     as a child   ANTERIOR CERVICAL DECOMP/DISCECTOMY FUSION N/A 03/06/2022   Procedure: CERVICAL THREE-FOUR, CERVICAL FIVE-SIX ANTERIOR CERVICAL DECOMPRESSION/DISCECTOMY FUSION WITH REMOVAL OF CERVICAL FOUR-FIVE PLATE;  Surgeon: Pincus Bridgeman, DO;  Location: [facility] OR;  Service: Neurosurgery;  Laterality: N/A;   CERVICAL FUSION  2000   CESAREAN SECTION     x2   CYSTECTOMY     from back x 2   TONSILLECTOMY      Family History  Problem Relation Age of Onset   Heart attack Mother    Arthritis Mother    Anxiety disorder Mother    Heart attack Father    Colon cancer Father 78  father having chemo now for colon cancer     Social History   Socioeconomic History   Marital status: Divorced    Spouse name: Not on file   Number of children: 2   Years of education: Not on file   Highest education level: Bachelor's degree (e.g., BA, AB, BS)  Occupational History   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[employer]    Comment: wedding Education officer, museum  Tobacco Use   Smoking status: Every Day    Current packs/day: 0.50    Average packs/day: 0.5 packs/day for 26.0 years (13.0 ttl pk-yrs)    Types: Cigarettes   Smokeless tobacco: Never   Tobacco comments:    trying to stop smoking  Vaping Use   Vaping status: Never Used   Substance and Sexual Activity   Alcohol use: Yes    Alcohol/week: 14.0 standard drinks of alcohol    Types: 14 Glasses of wine per week   Drug use: Not Currently   Sexual activity: Not on file  Other Topics Concern   Not on file  Social History Narrative   Regular exercise:  No   Owns a local wedding industry.     Social Drivers of Corporate investment banker Strain: Low Risk  (01/22/2024)   Overall Financial Resource Strain (CARDIA)    Difficulty of Paying Living Expenses: Not very hard  Food Insecurity: No Food Insecurity (01/22/2024)   Hunger Vital Sign    Worried About Running Out of Food in the Last Year: Never true    Ran Out of Food in the Last Year: Never true  Transportation Needs: No Transportation Needs (01/22/2024)   PRAPARE - Administrator, Civil Service (Medical): No    Lack of Transportation (Non-Medical): No  Physical Activity: Insufficiently Active (01/22/2024)   Exercise Vital Sign    Days of Exercise per Week: 2 days    Minutes of Exercise per Session: 20 min  Stress: No Stress Concern Present (01/22/2024)   [employer] of Occupational Health - Occupational Stress Questionnaire    Feeling of Stress : Not at all  Social Connections: Socially Isolated (01/22/2024)   Social Connection and Isolation Panel [NHANES]    Frequency of Communication with Friends and Family: More than three times a week    Frequency of Social Gatherings with Friends and Family: Once a week    Attends Religious Services: Never    Database administrator or Organizations: No    Attends Engineer, structural: Not on file    Marital Status: Divorced  Catering manager Violence: Not on file    Outpatient Medications Prior to Visit  Medication Sig Dispense Refill   acetaminophen  (TYLENOL ) 325 MG tablet Take 2 tablets (650 mg total) by mouth every 4 (four) hours as needed for mild pain or moderate pain (or Fever >/= 101).     amLODipine  (NORVASC ) 10 MG tablet TAKE 1 TABLET  (10 MG TOTAL) BY MOUTH DAILY. APPT FOR FURTHER REFILLS 30 tablet 0   venlafaxine  XR (EFFEXOR -XR) 75 MG 24 hr capsule TAKE 1 CAPSULE BY MOUTH DAILY WITH BREAKFAST. 90 capsule 1   No facility-administered medications prior to visit.    Allergies  Allergen Reactions   Hydrocodone Itching   Bupropion Hcl     REACTION: hives   Buspar  [Buspirone ] Other (See Comments)    Nausea, dizziness, weakness, muscle cramps   Chantix  [Varenicline ]     dizziness   Propoxyphene Hives  Propoxyphene N-Acetaminophen      REACTION: hives   Zoloft [Sertraline]     hives    Review of Systems  Constitutional:  Negative for weight loss.  HENT:  Negative for congestion and hearing loss.   Eyes:  Negative for blurred vision.  Respiratory:  Negative for cough.   Cardiovascular:  Negative for leg swelling.  Gastrointestinal:  Negative for constipation and diarrhea.  Genitourinary:  Negative for dysuria and frequency.  Musculoskeletal:  Positive for back pain (occasional) and neck pain.  Skin:  Negative for rash.  Neurological:  Negative for headaches.  Psychiatric/Behavioral:  Positive for depression.        Objective:    Physical Exam Exam conducted with a chaperone present.      BP 130/76 (BP Location: Right Arm, Patient Position: Sitting, Cuff Size: Small)   Pulse 76   Temp 98.6 F (37 C) (Oral)   Resp 16   Ht 5\' 3"  (1.6 m)   Wt 116 lb (52.6 kg)   LMP 03/21/2012   SpO2 100%   BMI 20.55 kg/m  Wt Readings from Last 3 Encounters:  01/23/24 116 lb (52.6 kg)  12/27/22 117 lb (53.1 kg)  03/08/22 121 lb 11.1 oz (55.2 kg)   Physical Exam  Constitutional: She is oriented to person, place, and time. She appears well-developed and well-nourished. No distress.  HENT:  Head: Normocephalic and atraumatic.  Right Ear: Tympanic membrane and ear canal normal.  Left Ear: Tympanic membrane and ear canal normal.  Mouth/Throat: Oropharynx is clear and moist.  Eyes: Pupils are equal, round, and  reactive to light. No scleral icterus.  Neck: Normal range of motion. No thyromegaly present.  Cardiovascular: Normal rate and regular rhythm.   No murmur heard. Pulmonary/Chest: Effort normal and breath sounds normal. No respiratory distress. He has no wheezes. She has no rales. She exhibits no tenderness.  Abdominal: Soft. Bowel sounds are normal. She exhibits no distension and no mass. There is no tenderness. There is no rebound and no guarding.  Musculoskeletal: She exhibits no edema.  Lymphadenopathy:    She has no cervical adenopathy.  Neurological: She is alert and oriented to person, place, and time. She has normal patellar reflexes. She exhibits normal muscle tone. Coordination normal.  Skin: Skin is warm and dry.  Psychiatric: She has a normal mood and affect. Her behavior is normal. Judgment and thought content normal.  Breasts: deferred External genitalia: Normal  BUS/Urethra/Skene's glands: Normal  Bladder: Normal  Vagina: Normal  Cervix: Normal  Uterus: normal in size, shape and contour. Midline and mobile  Adnexa/parametria:  Rt: Without masses or tenderness.  Lt: Without masses or tenderness.  Anus and perineum: Normal            Assessment & Plan:       Assessment & Plan:   Problem List Items Addressed This Visit       Unprioritized   Tobacco abuse   Vaping, advised cessation.      Preventative health care - Primary    Routine visit. Discussed health maintenance. Neck pain affects sleep and exercise. Up to date on dental and vision care. Issues with progressive glasses. Limited exercise due to neck pain. - Performed pap smear today. -[facility] for 1 [facility].       HTN (hypertension)   Stable on amlodipine  10mg .  Continue same.       Depression with anxiety   Reports good mood  on Effexor  xr.  Continue same.       Other Visit Diagnoses       Hyperglycemia       Relevant Orders    Comp Met (CMET)   HgB A1c     Hyperlipidemia, unspecified hyperlipidemia type       Relevant Orders   Lipid panel     Mass of right breast, unspecified quadrant       Relevant Orders   MM Digital Diagnostic Bilat   US  BREAST COMPLETE UNI RIGHT INC AXILLA     Encounter for routine gynecological examination with Papanicolaou smear of cervix       Relevant Orders   Cytology - PAP( [place])       I am having Abby Hocking. Krout maintain her acetaminophen , venlafaxine  XR, and amLODipine .  No orders of the defined types were placed in this encounter.

## 2024-01-23 NOTE — Assessment & Plan Note (Addendum)
  Routine visit. Discussed health maintenance. Neck pain affects sleep and exercise. Up to date on dental and vision care. Issues with progressive glasses. Limited exercise due to neck pain. - Performed pap smear today. -due for 1 year follow [facility]- order placed.

## 2024-01-23 NOTE — Assessment & Plan Note (Signed)
 Stable on amlodipine  10mg .  Continue same.

## 2024-01-23 NOTE — Patient Instructions (Addendum)
 VISIT SUMMARY:  Today, we discussed your ongoing neck pain following your triple fusion surgery and confirmed a herniated disc at C6-C7. We also reviewed your hypertension, hyperlipidemia, nicotine  dependence, and allergic rhinitis. A pap smear was performed, and we discussed your general health maintenance.  YOUR PLAN:  CERVICAL DISC DISORDER WITH RADICULOPATHY: You have persistent neck pain and arm tingling, especially at night, following your triple fusion surgery. An MRI and CT scan confirmed a herniated disc at C6-C7. -We are considering surgical intervention. In the meantime, continue using ibuprofen for pain relief as needed.  WELLNESS VISIT: Routine health maintenance visit. Your neck pain affects your sleep and exercise. You are up to date on dental and vision care, but have issues with your progressive glasses. -A pap smear was performed today. -Today's visit will count as your physical examination.  HYPERTENSION: Your blood pressure is well-controlled at 130/76 mmHg on amlodipine  10 mg, but you report ankle swelling. -Continue taking amlodipine  10 mg as prescribed.  HYPERLIPIDEMIA: You have a history of elevated cholesterol levels. -We will order a lipid panel to reassess your cholesterol levels.  NICOTINE  DEPENDENCE: You are using vaping to reduce nicotine  intake and find the action of inhaling and exhaling challenging to quit. -Continue to reduce the nicotine  concentration in your vape until you are down to zero.  ALLERGIC RHINITIS DUE TO POLLEN: You have seasonal allergies that are improving. -Continue managing your symptoms as you have been.  GENERAL HEALTH MAINTENANCE: We discussed your overall health maintenance needs. -Schedule a [facility] mammogram [facility] the [facility] due to previous findings. -Colonoscopy referral will be delayed until your neck issues are resolved. -You declined the shingles vaccine. -A pap smear was performed today.

## 2024-01-23 NOTE — Assessment & Plan Note (Signed)
 Reports good mood on Effexor  xr.  Continue same.

## 2024-01-23 NOTE — Assessment & Plan Note (Signed)
 Vaping, advised cessation.

## 2024-01-24 ENCOUNTER — Encounter: Payer: Self-pay | Admitting: Family

## 2024-01-24 DIAGNOSIS — E785 Hyperlipidemia, unspecified: Secondary | ICD-10-CM

## 2024-01-24 LAB — CYTOLOGY - PAP
Comment: NEGATIVE
Diagnosis: NEGATIVE
High risk HPV: NEGATIVE

## 2024-01-24 MED ORDER — VENLAFAXINE HCL ER 75 MG PO CP24: 75.0000 mg | ORAL_CAPSULE | Freq: Every day | ORAL | 1 refills | Status: DC

## 2024-01-24 MED ORDER — ROSUVASTATIN CALCIUM 10 MG PO TABS: 10.0000 mg | ORAL_TABLET | Freq: Every day | ORAL | 1 refills | Status: DC

## 2024-01-24 MED ORDER — AMLODIPINE BESYLATE 10 MG PO TABS: 10.0000 mg | ORAL_TABLET | Freq: Every day | ORAL | 1 refills | Status: DC

## 2024-01-24 NOTE — Addendum Note (Signed)
 Addended by: Dorrene Gaucher on: 01/24/2024 10:01 AM   Modules accepted: Orders

## 2024-01-31 DIAGNOSIS — M4322 Fusion of spine, cervical region: Secondary | ICD-10-CM | POA: Diagnosis not present

## 2024-01-31 DIAGNOSIS — M50123 Cervical disc disorder at C6-C7 level with radiculopathy: Secondary | ICD-10-CM | POA: Diagnosis not present

## 2024-02-01 DIAGNOSIS — M5412 Radiculopathy, cervical region: Secondary | ICD-10-CM | POA: Diagnosis not present

## 2024-03-06 ENCOUNTER — Encounter: Payer: Self-pay | Admitting: Family

## 2024-06-13 ENCOUNTER — Ambulatory Visit
Admission: RE | Admit: 2024-06-13 | Discharge: 2024-06-13 | Disposition: A | Discharge status: Home / Self Care | Source: Ambulatory Visit | Attending: Family | Admitting: Family

## 2024-06-13 ENCOUNTER — Other Ambulatory Visit: Payer: Self-pay | Admitting: Family

## 2024-06-13 DIAGNOSIS — N6310 Unspecified lump in the right breast, unspecified quadrant: Secondary | ICD-10-CM

## 2024-06-13 DIAGNOSIS — R928 Other abnormal and inconclusive findings on diagnostic imaging of breast: Secondary | ICD-10-CM | POA: Diagnosis not present

## 2024-06-13 DIAGNOSIS — N6489 Other specified disorders of breast: Secondary | ICD-10-CM | POA: Diagnosis not present

## 2024-07-25 ENCOUNTER — Other Ambulatory Visit: Payer: Self-pay | Admitting: Family

## 2024-08-16 ENCOUNTER — Other Ambulatory Visit: Payer: Self-pay | Admitting: Family

## 2024-08-16 ENCOUNTER — Encounter: Payer: Self-pay | Admitting: *Deleted

## 2024-08-17 ENCOUNTER — Encounter: Payer: Self-pay | Admitting: Family

## 2024-08-17 DIAGNOSIS — F418 Other specified anxiety disorders: Secondary | ICD-10-CM

## 2024-08-17 DIAGNOSIS — I10 Essential (primary) hypertension: Secondary | ICD-10-CM

## 2024-08-18 ENCOUNTER — Other Ambulatory Visit: Payer: Self-pay | Admitting: Family

## 2024-08-18 DIAGNOSIS — I10 Essential (primary) hypertension: Secondary | ICD-10-CM

## 2024-08-18 MED ORDER — VENLAFAXINE HCL ER 75 MG PO CP24: 75.0000 mg | ORAL_CAPSULE | Freq: Every day | ORAL | 0 refills | Status: DC

## 2024-08-18 MED ORDER — AMLODIPINE BESYLATE 10 MG PO TABS: 10.0000 mg | ORAL_TABLET | Freq: Every day | ORAL | 1 refills | Status: DC

## 2024-08-29 ENCOUNTER — Other Ambulatory Visit: Payer: Self-pay | Admitting: Family

## 2024-08-29 DIAGNOSIS — F418 Other specified anxiety disorders: Secondary | ICD-10-CM

## 2024-10-01 ENCOUNTER — Other Ambulatory Visit: Payer: Self-pay | Admitting: Family

## 2024-10-01 DIAGNOSIS — F418 Other specified anxiety disorders: Secondary | ICD-10-CM

## 2024-10-05 ENCOUNTER — Other Ambulatory Visit: Payer: Self-pay | Admitting: Family

## 2024-10-05 ENCOUNTER — Encounter: Payer: Self-pay | Admitting: Family

## 2024-10-05 DIAGNOSIS — F418 Other specified anxiety disorders: Secondary | ICD-10-CM

## 2024-10-05 MED ORDER — VENLAFAXINE HCL ER 75 MG PO CP24: 75.0000 mg | ORAL_CAPSULE | Freq: Every day | ORAL | 0 refills | Status: DC

## 2024-10-24 ENCOUNTER — Ambulatory Visit: Admitting: Family

## 2024-10-24 ENCOUNTER — Encounter: Payer: Self-pay | Admitting: Family

## 2024-10-24 ENCOUNTER — Ambulatory Visit (HOSPITAL_BASED_OUTPATIENT_CLINIC_OR_DEPARTMENT_OTHER): Admission: RE | Admit: 2024-10-24 | Source: Ambulatory Visit

## 2024-10-24 ENCOUNTER — Ambulatory Visit: Payer: Self-pay | Admitting: Family

## 2024-10-24 VITALS — BP 118/87 | HR 99 | Temp 98.7°F | Resp 16 | Ht 63.0 in | Wt 112.0 lb

## 2024-10-24 DIAGNOSIS — I10 Essential (primary) hypertension: Secondary | ICD-10-CM

## 2024-10-24 DIAGNOSIS — R079 Chest pain, unspecified: Secondary | ICD-10-CM | POA: Insufficient documentation

## 2024-10-24 DIAGNOSIS — M4802 Spinal stenosis, cervical region: Secondary | ICD-10-CM

## 2024-10-24 DIAGNOSIS — Z720 Tobacco use: Secondary | ICD-10-CM

## 2024-10-24 DIAGNOSIS — Z1211 Encounter for screening for malignant neoplasm of colon: Secondary | ICD-10-CM

## 2024-10-24 DIAGNOSIS — F17200 Nicotine dependence, unspecified, uncomplicated: Secondary | ICD-10-CM | POA: Insufficient documentation

## 2024-10-24 DIAGNOSIS — Z23 Encounter for immunization: Secondary | ICD-10-CM

## 2024-10-24 DIAGNOSIS — E785 Hyperlipidemia, unspecified: Secondary | ICD-10-CM | POA: Insufficient documentation

## 2024-10-24 DIAGNOSIS — F418 Other specified anxiety disorders: Secondary | ICD-10-CM

## 2024-10-24 DIAGNOSIS — F17299 Nicotine dependence, other tobacco product, with unspecified nicotine-induced disorders: Secondary | ICD-10-CM

## 2024-10-24 LAB — LIPID PANEL
Cholesterol: 287 mg/dL — ABNORMAL HIGH (ref 28–200)
HDL: 106.5 mg/dL
LDL Cholesterol: 169 mg/dL — ABNORMAL HIGH (ref 10–99)
NonHDL: 180.58
Total CHOL/HDL Ratio: 3
Triglycerides: 58 mg/dL (ref 10.0–149.0)
VLDL: 11.6 mg/dL (ref 0.0–40.0)

## 2024-10-24 LAB — COMPREHENSIVE METABOLIC PANEL WITH GFR
ALT: 19 U/L (ref 3–35)
AST: 21 U/L (ref 5–37)
Albumin: 4.8 g/dL (ref 3.5–5.2)
Alkaline Phosphatase: 28 U/L — ABNORMAL LOW (ref 39–117)
BUN: 10 mg/dL (ref 6–23)
CO2: 30 meq/L (ref 19–32)
Calcium: 9.6 mg/dL (ref 8.4–10.5)
Chloride: 102 meq/L (ref 96–112)
Creatinine, Ser: 0.64 mg/dL (ref 0.40–1.20)
GFR: 98.54 mL/min
Glucose, Bld: 88 mg/dL (ref 70–99)
Potassium: 4.5 meq/L (ref 3.5–5.1)
Sodium: 138 meq/L (ref 135–145)
Total Bilirubin: 0.5 mg/dL (ref 0.2–1.2)
Total Protein: 6.8 g/dL (ref 6.0–8.3)

## 2024-10-24 MED ORDER — VENLAFAXINE HCL ER 75 MG PO CP24: 75.0000 mg | ORAL_CAPSULE | Freq: Every day | ORAL | 1 refills | Status: AC

## 2024-10-24 MED ORDER — AMLODIPINE BESYLATE 10 MG PO TABS: 10.0000 mg | ORAL_TABLET | Freq: Every day | ORAL | 1 refills | Status: AC

## 2024-10-24 NOTE — Assessment & Plan Note (Addendum)
" °  Intermittent chest pain likely musculoskeletal. Differential includes pulmonary causes. - Ordered chest x-ray and EKG. -  EKG tracing is personally reviewed.  EKG notes NSR.  No acute changes. Note is made of PVC/artifact. - Ordered lung cancer screening CT at [facility]. - Advised Advil 400 mg twice daily for one week. - Instructed to seek emergency care if severe pain occurs. - Consider cardiology referral if pain persists beyond one week. "

## 2024-10-24 NOTE — Assessment & Plan Note (Signed)
" °  Continues vaping. Discussed risks and reduction strategies. - Advised gradual reduction of nicotine  use. - Discussed potential transition to zero nicotine  products. "

## 2024-10-24 NOTE — Assessment & Plan Note (Signed)
 Patient stopped crestor  because her chiropractor told her it was bad for me.  We discussed risk/benefit of crestor  in reducing risk of heart attack and stroke.  Will repeat lipid panel today.

## 2024-10-24 NOTE — Assessment & Plan Note (Addendum)
" °  Mood stable on current medication. Normal grief-related emotions present. - Continue Venlafaxine  XR 75 mg daily. - Sent 90-day refill of Venlafaxine  XR to pharmacy. "

## 2024-10-24 NOTE — Progress Notes (Signed)
 "  Subjective:     Patient ID: Alexis English, female    DOB: 1966-08-25, 58 y.o.   MRN: [id number]  Chief Complaint  Patient presents with   Hypertension    Here for follow up   Chest Pain    Patient reports having pain under left breast for 2 days.     HPI  Discussed the use of AI scribe software for clinical note transcription with the patient, who gave verbal consent to proceed.  History of Present Illness Alexis English is a 59 year old female with hypertension who presents today for follow up.    She has been experiencing chest pain for the past two days, described as sharp and dull, exacerbated by certain movements and deep breaths. The pain is reminiscent of when she had pneumonia 15 years ago, although she currently has no cough or fever. The pain worsens when she leans over or takes a deep breath.  She has a history of neck pain, cervical fusion, managed with weekly chiropractic sessions. She has undergone a triple fusion and has a bulging disc at C6-7 and a bone spur behind the fusion. The neck pain is particularly problematic at night, and different pillows have not alleviated the discomfort.  Her blood pressure is well-controlled with amlodipine  10 mg daily. She is also on Effexor  XR 75 mg for mood, which is stable except for normal grief due to her father's recent passing from colon cancer.  She quit smoking in June 2023 after her triple fusion but continues to vape throughout the day. Her family history is significant for colon cancer, which her father had. She is due for a colonoscopy, with her last one being in 2019.    Health Maintenance Due  Topic Date Due   Hepatitis C Screening  Never done   Hepatitis B Vaccines 19-59 Average Risk (1 of 3 - 19+ 3-dose series) Never done   Pneumococcal Vaccine: 50+ Years (1 of 1 - PCV) Never done   Zoster Vaccines- Shingrix (1 of 2) Never done   Colonoscopy  02/20/2021   COVID-19 Vaccine (3 - 2025-26 season) 05/19/2024     Past Medical History:  Diagnosis Date   Anxiety    Depression    History of kidney stones    Hypertension    Pneumonia 08/2010   Pulmonary nodule 08/2010   per CT   Ruptured disc, cervical    C4-5; cervical fusion   Smoker     Past Surgical History:  Procedure Laterality Date   ABDOMINAL HERNIA REPAIR     as a child   ANTERIOR CERVICAL DECOMP/DISCECTOMY FUSION N/A 03/06/2022   Procedure: CERVICAL THREE-FOUR, CERVICAL FIVE-SIX ANTERIOR CERVICAL DECOMPRESSION/DISCECTOMY FUSION WITH REMOVAL OF CERVICAL FOUR-FIVE PLATE;  Surgeon: Carollee Lani BROCKS, DO;  Location: [facility] OR;  Service: Neurosurgery;  Laterality: N/A;   CERVICAL FUSION  2000   CESAREAN SECTION     x2   CYSTECTOMY     from back x 2   TONSILLECTOMY      Family History  Problem Relation Age of Onset   Heart attack Mother    Arthritis Mother    Anxiety disorder Mother    Heart attack Father    Colon cancer Father 62       father having chemo now for colon cancer     Social History   Socioeconomic History   Marital status: Divorced    Spouse name: Not on file   Number of children: 2  Years of education: Not on file   Highest education level: Bachelor's degree (e.g., BA, AB, BS)  Occupational History   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[employer]    Comment: wedding education officer, museum  Tobacco Use   Smoking status: Former    Current packs/day: 0.00    Average packs/day: 0.5 packs/day for 26.0 years (13.0 ttl pk-yrs)    Types: Cigarettes    Quit date: 02/16/2022    Years since quitting: 2.6   Smokeless tobacco: Never  Vaping Use   Vaping status: Never Used  Substance and Sexual Activity   Alcohol use: Yes    Alcohol/week: 14.0 standard drinks of alcohol    Types: 14 Glasses of wine per week   Drug use: Not Currently   Sexual activity: Not on file  Other Topics Concern   Not on file  Social History Narrative   Regular exercise:  No   Owns a local wedding industry.     Social Drivers of  Health   Tobacco Use: Medium Risk (10/24/2024)   Patient History    Smoking Tobacco Use: Former    Smokeless Tobacco Use: Never    Passive Exposure: Not on file  Financial Resource Strain: Low Risk (01/31/2024)   Received from [facility]   Overall Financial Resource Strain (CARDIA)    Difficulty of Paying Living Expenses: Not hard at all  Food Insecurity: No Food Insecurity (01/31/2024)   Received from [facility]   Epic    Within the past 12 months, you worried that your food would run out before you got the money to buy more.: Never true    Within the past 12 months, the food you bought just didn't last and you didn't have money to get more.: Never true  Transportation Needs: No Transportation Needs (01/31/2024)   Received from [facility] - Transportation    Lack of Transportation (Medical): No    Lack of Transportation (Non-Medical): No  Physical Activity: Insufficiently Active (01/22/2024)   Exercise Vital Sign    Days of Exercise per Week: 2 days    Minutes of Exercise per Session: 20 min  Stress: No Stress Concern Present (01/22/2024)   [employer] of Occupational Health - Occupational Stress Questionnaire    Feeling of Stress : Not at all  Social Connections: Socially Isolated (01/22/2024)   Social Connection and Isolation Panel    Frequency of Communication with Friends and Family: More than three times a week    Frequency of Social Gatherings with Friends and Family: Once a week    Attends Religious Services: Never    Database Administrator or Organizations: No    Attends Engineer, Structural: Not on file    Marital Status: Divorced  Intimate Partner Violence: Not on file  Depression (PHQ2-9): Low Risk (10/24/2024)   Depression (PHQ2-9)    PHQ-2 Score: 0  Alcohol Screen: Low Risk (01/22/2024)   Alcohol Screen    Last Alcohol Screening Score (AUDIT): 4  Housing: Low Risk (01/31/2024)   Received from [facility]    In the last 12 months,  was there a time when you were not able to pay the mortgage or rent on time?: No    In the past 12 months, how many times have you moved where you were living?: 0    At any time in the past 12 months, were you homeless or living in a shelter (including now)?: No  Utilities: Not At Risk (01/31/2024)   Received from [facility] Utilities    Threatened with loss of utilities: No  Health Literacy: Not on file    Outpatient Medications Prior to Visit  Medication Sig Dispense Refill   acetaminophen  (TYLENOL ) 325 MG tablet Take 2 tablets (650 mg total) by mouth every 4 (four) hours as needed for mild pain or moderate pain (or Fever >/= 101).     amLODipine  (NORVASC ) 10 MG tablet Take 1 tablet (10 mg total) by mouth daily. 30 tablet 1   venlafaxine  XR (EFFEXOR -XR) 75 MG 24 hr capsule Take 1 capsule (75 mg total) by mouth daily with breakfast. 30 capsule 0   rosuvastatin  (CRESTOR ) 10 MG tablet Take 1 tablet (10 mg total) by mouth daily. 90 tablet 1   No facility-administered medications prior to visit.    Allergies[1]  ROS See HPI    Objective:    Physical Exam Constitutional:      General: She is not in acute distress.    Appearance: Normal appearance. She is well-developed.  HENT:     Head: Normocephalic and atraumatic.     Right Ear: External ear normal.     Left Ear: External ear normal.  Eyes:     General: No scleral icterus. Neck:     Thyroid: No thyromegaly.  Cardiovascular:     Rate and Rhythm: Normal rate and regular rhythm.     Heart sounds: Normal heart sounds. No murmur heard. Pulmonary:     Effort: Pulmonary effort is normal. No respiratory distress.     Breath sounds: Normal breath sounds. No wheezing.  Musculoskeletal:     Cervical back: Neck supple.  Skin:    General: Skin is warm and dry.  Neurological:     Mental Status: She is alert and oriented to person, place, and time.  Psychiatric:        Mood and Affect: Mood normal.        Behavior:  Behavior normal.        Thought Content: Thought content normal.        Judgment: Judgment normal.      BP 118/87 (BP Location: Right Arm, Patient Position: Sitting, Cuff Size: Small)   Pulse 99   Temp 98.7 F (37.1 C) (Oral)   Resp 16   Ht 5' 3 (1.6 m)   Wt 112 lb (50.8 kg)   LMP 03/21/2012   SpO2 100%   BMI 19.84 kg/m  Wt Readings from Last 3 Encounters:  10/24/24 112 lb (50.8 kg)  01/23/24 116 lb (52.6 kg)  12/27/22 117 lb (53.1 kg)       Assessment & Plan:     Assessment & Plan   General health maintenance Due for pneumonia vaccination and colonoscopy. Discussed benefits given smoking history. - Administered Prevnar pneumonia vaccine. - Scheduled colonoscopy with Dr. Charlanne. - Discussed shingles vaccination for future visit.   I have discontinued [facility]'s rosuvastatin . I am also having her maintain her acetaminophen , amLODipine , and venlafaxine  XR.  Meds ordered this encounter  Medications   amLODipine  (NORVASC ) 10 MG tablet    Sig: Take 1 tablet (10 mg total) by mouth daily.    Dispense:  90 tablet    Refill:  1    Supervising Provider:   DOMENICA BLACKBIRD A [[id number]]   venlafaxine  XR (EFFEXOR -XR) 75 MG 24 hr capsule    Sig: Take 1 capsule (75 mg total) by mouth daily with breakfast.  Dispense:  90 capsule    Refill:  1    Supervising Provider:   DOMENICA BLACKBIRD A [[id number]]      [1]  Allergies Allergen Reactions   Hydrocodone Itching   Bupropion Hcl     REACTION: hives   Buspar  [Buspirone ] Other (See Comments)    Nausea, dizziness, weakness, muscle cramps   Chantix  [Varenicline ]     dizziness   Propoxyphene Hives   Propoxyphene N-Acetaminophen      REACTION: hives   Zoloft [Sertraline]     hives   "

## 2024-10-24 NOTE — Assessment & Plan Note (Signed)
 Chronic neck pain with positional exacerbation. - Continues chiropractic care once a week. - Considering injection to determine pain source as suggested by surgeon.

## 2024-10-24 NOTE — Assessment & Plan Note (Signed)
 Stable on amlodipine  10mg .  Continue same.

## 2024-10-24 NOTE — Patient Instructions (Signed)
" °  VISIT SUMMARY: During your visit, we discussed your chest pain, hypertension, hyperlipidemia, depression with anxiety, cervical radiculopathy, and nicotine  dependence. We also reviewed your general health maintenance needs.  YOUR PLAN: -CHEST PAIN, LIKELY MUSCULOSKELETAL: Your chest pain is likely due to muscle or bone issues. We have ordered a chest x-ray and EKG to rule out other causes. You should take Advil 400 mg twice daily for one week and seek emergency care if the pain becomes severe. If the pain persists beyond one week, we may refer you to a cardiologist.  -PRIMARY HYPERTENSION: Your blood pressure is well-controlled with your current medication. Continue taking Amlodipine  10 mg daily. We have sent a 90-day refill to your pharmacy.  -HYPERLIPIDEMIA: Your cholesterol levels are elevated. We discussed the benefits and side effects of statin therapy. We have ordered repeat lipid panel and kidney function tests to determine the next steps.  -DEPRESSION WITH ANXIETY: Your mood is stable with your current medication. Continue taking Venlafaxine  XR 75 mg daily. We have sent a 90-day refill to your pharmacy.  -CERVICAL RADICULOPATHY AND POST-SURGICAL PAIN: Your chronic neck pain is being managed with weekly chiropractic care. We may consider an injection to determine the pain source as suggested by your surgeon.  -NICOTINE  DEPENDENCE: You continue to vape. We discussed the risks and strategies to reduce nicotine  use, including gradually reducing your intake and potentially transitioning to zero nicotine  products.  -GENERAL HEALTH MAINTENANCE: You are due for a pneumonia vaccination and a colonoscopy. We administered the Prevnar pneumonia vaccine today and scheduled a colonoscopy with Dr. Charlanne. We also discussed getting a shingles vaccination at a future visit.  INSTRUCTIONS: Please follow up with the chest x-ray, EKG, and lung cancer screening CT as ordered. Take Advil 400 mg twice daily for  one week for your chest pain. If the pain becomes severe or persists beyond one week, seek emergency care or consider a cardiology referral. Continue your current medications for hypertension and depression, and follow up with the repeat lipid panel and kidney function tests. Maintain your weekly chiropractic care for neck pain and consider the suggested injection. Gradually reduce nicotine  use and consider transitioning to zero nicotine  products. Attend your scheduled colonoscopy and consider the shingles vaccination at your next visit.    Contains text generated by [employer].   "

## 2024-11-05 ENCOUNTER — Ambulatory Visit: Admitting: Family
